# Patient Record
Sex: Male | Born: 1955
Health system: Southern US, Community
[De-identification: ages and names within clinical notes are randomized; demographics above are authoritative.]

## PROBLEM LIST (undated history)

## (undated) DIAGNOSIS — R972 Elevated prostate specific antigen [PSA]: Secondary | ICD-10-CM

## (undated) DIAGNOSIS — R399 Unspecified symptoms and signs involving the genitourinary system: Secondary | ICD-10-CM

## (undated) DIAGNOSIS — G4733 Obstructive sleep apnea (adult) (pediatric): Secondary | ICD-10-CM

## (undated) DIAGNOSIS — N400 Enlarged prostate without lower urinary tract symptoms: Secondary | ICD-10-CM

## (undated) DIAGNOSIS — M1990 Unspecified osteoarthritis, unspecified site: Secondary | ICD-10-CM

## (undated) DIAGNOSIS — K219 Gastro-esophageal reflux disease without esophagitis: Secondary | ICD-10-CM

## (undated) HISTORY — DX: Unspecified osteoarthritis, unspecified site: M19.90

## (undated) HISTORY — DX: Gastro-esophageal reflux disease without esophagitis: K21.9

---

## 2009-03-03 HISTORY — PX: COLONOSCOPY: SHX174

## 2011-08-19 ENCOUNTER — Encounter: Payer: Self-pay | Admitting: Family Medicine

## 2011-08-19 ENCOUNTER — Ambulatory Visit (INDEPENDENT_AMBULATORY_CARE_PROVIDER_SITE_OTHER): Payer: 59 | Admitting: Family Medicine

## 2011-08-19 VITALS — BP 116/62 | HR 73 | Ht 70.5 in | Wt 191.0 lb

## 2011-08-19 DIAGNOSIS — M79609 Pain in unspecified limb: Secondary | ICD-10-CM

## 2011-08-19 DIAGNOSIS — K219 Gastro-esophageal reflux disease without esophagitis: Secondary | ICD-10-CM | POA: Insufficient documentation

## 2011-08-19 DIAGNOSIS — M79642 Pain in left hand: Secondary | ICD-10-CM

## 2011-08-19 DIAGNOSIS — L989 Disorder of the skin and subcutaneous tissue, unspecified: Secondary | ICD-10-CM

## 2011-08-19 DIAGNOSIS — G8929 Other chronic pain: Secondary | ICD-10-CM

## 2011-08-19 DIAGNOSIS — M11849 Other specified crystal arthropathies, unspecified hand: Secondary | ICD-10-CM | POA: Insufficient documentation

## 2011-08-19 MED ORDER — GABAPENTIN 300 MG PO CAPS: 300.0000 mg | ORAL_CAPSULE | Freq: Three times a day (TID) | ORAL | Status: DC

## 2011-08-19 MED ORDER — PANTOPRAZOLE SODIUM 40 MG PO TBEC: 40.0000 mg | DELAYED_RELEASE_TABLET | Freq: Every day | ORAL | Status: DC

## 2011-08-19 MED ORDER — MELOXICAM 15 MG PO TABS: 15.0000 mg | ORAL_TABLET | Freq: Every day | ORAL | Status: DC
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
## 2011-08-19 NOTE — Patient Instructions (Signed)

## 2011-10-21 ENCOUNTER — Ambulatory Visit (INDEPENDENT_AMBULATORY_CARE_PROVIDER_SITE_OTHER): Payer: 59 | Admitting: Family Medicine

## 2011-10-21 ENCOUNTER — Encounter: Payer: Self-pay | Admitting: Family Medicine

## 2011-10-21 VITALS — BP 120/80 | HR 68 | Ht 70.5 in | Wt 187.0 lb

## 2011-10-21 DIAGNOSIS — M67919 Unspecified disorder of synovium and tendon, unspecified shoulder: Secondary | ICD-10-CM

## 2011-10-21 DIAGNOSIS — M79609 Pain in unspecified limb: Secondary | ICD-10-CM

## 2011-10-21 DIAGNOSIS — M79641 Pain in right hand: Secondary | ICD-10-CM

## 2011-10-21 DIAGNOSIS — M7550 Bursitis of unspecified shoulder: Secondary | ICD-10-CM

## 2011-10-21 DIAGNOSIS — L989 Disorder of the skin and subcutaneous tissue, unspecified: Secondary | ICD-10-CM

## 2011-10-21 MED ORDER — OMEPRAZOLE 40 MG PO CPDR: 40.0000 mg | DELAYED_RELEASE_CAPSULE | Freq: Every day | ORAL | Status: DC

## 2011-10-21 MED ORDER — CELECOXIB 200 MG PO CAPS: 200.0000 mg | ORAL_CAPSULE | Freq: Two times a day (BID) | ORAL | Status: DC

## 2011-10-21 NOTE — Progress Notes (Signed)
  Subjective:    Patient ID: Cody Kane, male    DOB: March 12, 1955, 56 y.o.   MRN: [id number]  HPI #1 patient reports having seen the rheumatologist. He reports Neurontin and tramadol both caused CNS side effects with sedation and lightheadedness. He states that he went back to see the rheumatologist who gave him a trial of Celebrex which worked well and causing this in a side effects. #2 reworked better for him than the Protonix. flux. The Protonix also didn't agree with his system. When he took Prilosec OTC but doubled up that  #3 skin tag. He says skin tag on the right lower eye that has continued to grow and cause irritation.  #4 right shoulder pain. The pain in the right shoulder has been occurring for the last 2 weeks despite being on Celebrex twice a day that the rheumatologist has placed him on. He was curious if he can get a cortisone injection in the right shoulder.  Review of Systems  Gastrointestinal:       Dyspepsia  Musculoskeletal: Positive for arthralgias.  Skin:       Facial lesions on the right lower eye.  All other systems reviewed and are negative.        BP 120/80  Pulse 68  Ht 5' 10.5" (1.791 m)  Wt 187 lb (84.823 kg)  BMI 26.45 kg/m2  SpO2 100% Objective:   Physical Exam  Constitutional: He is oriented to person, place, and time. He appears well-developed. No distress.  HENT:  Head: Normocephalic.  Musculoskeletal: Normal range of motion. He exhibits tenderness.       Tenderness present in the right shoulder consistent with tendinitis or bursitis he is able to raise the right arm above his head.  Neurological: He is alert and oriented to person, place, and time.  Skin: Skin is warm and dry. No rash noted. He is not diaphoretic.       Moderate size skin tag present under the right eye  Psychiatric: He has a normal mood and affect. His behavior is normal.      Assessment & Plan:  #1 Will go ahead and stop the Mobic as well. New prescription for  Celebrex one  200 mg capsule once to twice a day  #2 reflux. We'll place him on Nuexim 40 mg one capsule a day and see if that helps Nexium.  #3 skin tag, Cyro-surgery performed with good freezing of lesion. Explained to return in 6-8 weeks if it doesn't slough off completely.   #4 injection done in the right shoulder. Patient tolerated the injection well. Diagnosis of bursitis given due to the crunch heard after the third pass of medication injected.    Return in 2 months for yearly examination.Shoulder Injection Procedure Note  Pre-operative Diagnosis: right shoulder bursitis  Post-operative Diagnosis: same  Indications: Unexplained monoarthritis despite being on mobic and then celebrex  Anesthesia: not required   Procedure Details   Verbal consent was obtained for the procedure. The shoulder was prepped with iodine Using a 22 gauge needle the glenohumeral joint is injected with  A total of 9 mL 1% lidocaine w/o and 1 mL of decadron 40mg /ml . A total of 3 injections were given roughly 3 mls each. On the third injection a distinct crunch sound was heard consistent with a calcified bursa being injected. Complications:  None; patient tolerated the procedure well. And a Band-Aid was placed over the site.

## 2011-10-21 NOTE — Patient Instructions (Signed)
Bursitis  Bursitis is a swelling and soreness (inflammation) of a fluid-filled sac (bursa) that overlies and protects a joint. It can be caused by injury, overuse of the joint, arthritis or infection. The joints most likely to be affected are the elbows, shoulders, hips and knees.  HOME CARE INSTRUCTIONS    Apply ice to the affected area for 15 to 20 minutes each hour while awake for 2 days. Put the ice in a plastic bag and place a towel between the bag of ice and your skin.   Rest the injured joint as much as possible, but continue to put the joint through a full range of motion, 4 times per day. (The shoulder joint especially becomes rapidly "frozen" if not used.) When the pain lessens, begin normal slow movements and usual activities.   Only take over-the-counter or prescription medicines for pain, discomfort or fever as directed by your caregiver.   Your caregiver may recommend draining the bursa and injecting medicine into the bursa. This may help the healing process.   Follow all instructions for follow-up with your caregiver. This includes any orthopedic referrals, physical therapy and rehabilitation. Any delay in obtaining necessary care could result in a delay or failure of the bursitis to heal and chronic pain.  SEEK IMMEDIATE MEDICAL CARE IF:    Your pain increases even during treatment.   You develop an oral temperature above 102 F (38.9 C) and have heat and inflammation over the involved bursa.  MAKE SURE YOU:    Understand these instructions.   Will watch your condition.   Will get help right away if you are not doing well or get worse.  Document Released: 02/15/2000 Document Revised: 02/06/2011 Document Reviewed: 01/19/2009  [employer] Patient Information 2012 [employer].

## 2011-12-23 ENCOUNTER — Encounter: Payer: 59 | Admitting: Family Medicine

## 2011-12-23 DIAGNOSIS — Z0289 Encounter for other administrative examinations: Secondary | ICD-10-CM

## 2012-03-24 ENCOUNTER — Ambulatory Visit (INDEPENDENT_AMBULATORY_CARE_PROVIDER_SITE_OTHER): Payer: 59 | Admitting: Sports Medicine

## 2012-03-24 ENCOUNTER — Ambulatory Visit (INDEPENDENT_AMBULATORY_CARE_PROVIDER_SITE_OTHER): Payer: 59

## 2012-03-24 DIAGNOSIS — M25511 Pain in right shoulder: Secondary | ICD-10-CM

## 2012-03-24 DIAGNOSIS — M19011 Primary osteoarthritis, right shoulder: Secondary | ICD-10-CM | POA: Insufficient documentation

## 2012-03-24 DIAGNOSIS — M79609 Pain in unspecified limb: Secondary | ICD-10-CM

## 2012-03-24 DIAGNOSIS — M79641 Pain in right hand: Secondary | ICD-10-CM

## 2012-03-24 DIAGNOSIS — M79642 Pain in left hand: Secondary | ICD-10-CM

## 2012-03-24 DIAGNOSIS — M19019 Primary osteoarthritis, unspecified shoulder: Secondary | ICD-10-CM

## 2012-03-24 DIAGNOSIS — M25519 Pain in unspecified shoulder: Secondary | ICD-10-CM

## 2012-03-24 MED ORDER — DICLOFENAC SODIUM 1 % TD GEL: 2.0000 g | Freq: Four times a day (QID) | TRANSDERMAL | Status: DC

## 2012-03-24 NOTE — Assessment & Plan Note (Signed)
Symptoms are predominantly at the proximal and distal interphalangeal joints, this is highly suspicious for osteoarthritis. He will continue oral NSAIDs at home. I am also going to use Voltaren gel. I would like to see him back in 4 weeks and we can certainly consider x-ray and intra-articular injection if still painful.

## 2012-03-24 NOTE — Progress Notes (Signed)
SPORTS MEDICINE CONSULTATION REPORT  Subjective:    CC: Right shoulder pain  HPI: Cody Kane is a very pleasant 57 year old male who comes in with a long history of pain he localizes deep into the joint line of the right shoulder. He recalls approximately a decade ago had an MRI which showed some rotator cuff disease, it is equal he had a subacromial injection which was effective in controlling his pain. He also had some formal physical therapy. More recently, 3 months ago he saw Dr. Thurmond Butts who performed an injection it sounds like an attempted glenohumeral injection. Unfortunately the patient's pain did not improve not even for an hour after the procedure. Currently she localizes the pain to the joint line, it radiates down his arm but not past the elbow, it is not worse with overhead activities or reaching type activities. He does note a grind and occasional catch.  Occasionally wakes him from sleep but only when he's lying on the contralateral shoulder. He denies any trauma, or constitutional symptoms.  Bilateral hand pain: Predominantly bilaterally at the second through fourth proximal and distal interphalangeal joints. He does get significant gelling and stiffness in the morning that lasts anywhere from 1-2 hours. Currently is not having much pain. He has already been to the rheumatologist and has negative rheumatologic workup thus far. He's never had x-rays of his hands, has never had intra-articular treatment only oral.  Past medical history, Surgical history, Family history not pertinant except as noted below, Social history, Allergies, and medications have been entered into the medical record, reviewed, and no changes needed.   Review of Systems: No headache, visual changes, nausea, vomiting, diarrhea, constipation, dizziness, abdominal pain, skin rash, fevers, chills, night sweats, weight loss, swollen lymph nodes, body aches, joint swelling, muscle aches, chest pain, shortness of breath, mood  changes, visual or auditory hallucinations.   Objective:   General: Well Developed, well nourished, and in no acute distress.  Neuro/Psych: Alert and oriented x3, extra-ocular muscles intact, able to move all 4 extremities, sensation grossly intact. Skin: Warm and dry, no rashes noted.  Respiratory: Not using accessory muscles, speaking in full sentences, trachea midline.  Cardiovascular: Pulses palpable, no extremity edema. Abdomen: Does not appear distended. Right Shoulder: Inspection reveals no abnormalities, atrophy or asymmetry. Palpation is normal with no tenderness over AC joint or bicipital groove. ROM is full in all planes. Rotator cuff strength normal throughout. No signs of impingement with negative Neer and Hawkin's tests, empty can sign. Speeds and Yergason's tests normal. Negative O'Brien's, clunk, with good stability. Positive crank test, mild, suggests glenohumeral pathology. Normal scapular function observed. No painful arc and no drop arm sign. No apprehension sign Hands:  Both hands are somewhat full predominantly over the proximal interphalangeal joints. There stable with full range of motion, and no tenderness today.  X-rays were ordered and interpreted by me, they're essentially negative with only mild DJD at the a.c. joint, but overall normal-appearing glenohumeral joint. The axillary view is not an ideal angle and this is difficult to accurately determine the degree of joint osteoarthritis.  Procedure: Real-time diagnostic and therapeutic Ultrasound Guided Injection of right glenohumeral joint Device: GE Logiq E  Ultrasound guided injection is preferred based studies that show increased duration, increased effect, greater accuracy, decreased procedural pain, increased response rate, and decreased cost with ultrasound guided versus blind injection.  Verbal informed consent obtained.  Time-out conducted.  Noted no overlying erythema, induration, or other signs of  local infection.  Skin prepped in a  sterile fashion.  Local anesthesia: Topical Ethyl chloride.  With sterile technique and under real time ultrasound guidance:  Spinal needle advanced into glenohumeral joint just distal to the glenoid labrum. 1 cc Kenalog 40, 4 cc lidocaine injected easily capsule seen filling with fluid. Completed without difficulty  Pain immediately resolved suggesting accurate placement of the medication.  Advised to call if fevers/chills, erythema, induration, drainage, or persistent bleeding.  Images permanently stored and available for review in the ultrasound unit.  Impression: Technically successful ultrasound guided injection.  Impression and Recommendations:   This case required medical decision making of moderate complexity.

## 2012-03-24 NOTE — Assessment & Plan Note (Signed)
Ultrasound guided injection as above. Home rehabilitation exercises will be given. Return to clinic in 4 weeks to see things are going.

## 2012-04-21 ENCOUNTER — Ambulatory Visit (HOSPITAL_BASED_OUTPATIENT_CLINIC_OR_DEPARTMENT_OTHER)
Admission: RE | Admit: 2012-04-21 | Discharge: 2012-04-21 | Disposition: A | Discharge status: Home / Self Care | Payer: 59 | Source: Ambulatory Visit | Attending: Sports Medicine | Admitting: Sports Medicine

## 2012-04-21 ENCOUNTER — Ambulatory Visit (INDEPENDENT_AMBULATORY_CARE_PROVIDER_SITE_OTHER): Payer: 59 | Admitting: Sports Medicine

## 2012-04-21 DIAGNOSIS — M79609 Pain in unspecified limb: Secondary | ICD-10-CM

## 2012-04-21 DIAGNOSIS — M19011 Primary osteoarthritis, right shoulder: Secondary | ICD-10-CM

## 2012-04-21 DIAGNOSIS — M79641 Pain in right hand: Secondary | ICD-10-CM

## 2012-04-21 DIAGNOSIS — M19019 Primary osteoarthritis, unspecified shoulder: Secondary | ICD-10-CM

## 2012-04-21 MED ORDER — ACETAMINOPHEN ER 650 MG PO TBCR: 1300.0000 mg | EXTENDED_RELEASE_TABLET | Freq: Three times a day (TID) | ORAL | Status: DC | PRN

## 2012-04-21 NOTE — Progress Notes (Signed)
  Subjective:    CC: Followup  HPI: Glenohumeral arthritis: 100% resolved status post glenohumeral joint injection at the last visit.  Bilateral hand pain: Pain is worse at the metacarpophalangeal and proximal interphalangeal joints associated with swelling and stiffness. He's currently using Celebrex and does not want to pursue interventional treatment at this time. It is localized, moderate, doesn't radiate.  Past medical history, Surgical history, Family history not pertinant except as noted below, Social history, Allergies, and medications have been entered into the medical record, reviewed, and no changes needed.   Review of Systems: No headache, visual changes, nausea, vomiting, diarrhea, constipation, dizziness, abdominal pain, skin rash, fevers, chills, night sweats, weight loss, swollen lymph nodes, body aches, joint swelling, muscle aches, chest pain, shortness of breath, mood changes, visual or auditory hallucinations.   Objective:   General: Well Developed, well nourished, and in no acute distress.  Neuro/Psych: Alert and oriented x3, extra-ocular muscles intact, able to move all 4 extremities, sensation grossly intact. Skin: Warm and dry, no rashes noted.  Respiratory: Not using accessory muscles, speaking in full sentences, trachea midline.  Cardiovascular: Pulses palpable, no extremity edema. Abdomen: Does not appear distended.  Impression and Recommendations:   This case required medical decision making of moderate complexity.

## 2012-04-21 NOTE — Assessment & Plan Note (Signed)
Shoulder pain has improved significantly status post ultrasound-guided glenohumeral joint injection at the last visit.

## 2012-04-21 NOTE — Assessment & Plan Note (Signed)
Symptoms are highly classic for osteoarthritis particularly of the metacarpophalangeal and proximal interphalangeal joints. I would like x-rays or confirmation. He will continue Celebrex I would like to add Tylenol 650 mg 2 tabs 3, times a day. Return at the next flare we can consider metacarpophalangeal and interphalangeal joint injections.

## 2012-07-20 ENCOUNTER — Ambulatory Visit (INDEPENDENT_AMBULATORY_CARE_PROVIDER_SITE_OTHER): Payer: 59 | Admitting: Sports Medicine

## 2012-07-20 ENCOUNTER — Encounter: Payer: Self-pay | Admitting: Sports Medicine

## 2012-07-20 VITALS — BP 125/73 | HR 74 | Wt 188.0 lb

## 2012-07-20 DIAGNOSIS — M79641 Pain in right hand: Secondary | ICD-10-CM

## 2012-07-20 DIAGNOSIS — M25549 Pain in joints of unspecified hand: Secondary | ICD-10-CM

## 2012-07-20 DIAGNOSIS — M79609 Pain in unspecified limb: Secondary | ICD-10-CM

## 2012-07-20 MED ORDER — OMEPRAZOLE 40 MG PO CPDR: 40.0000 mg | DELAYED_RELEASE_CAPSULE | Freq: Every day | ORAL | Status: DC

## 2012-07-20 MED ORDER — CELECOXIB 200 MG PO CAPS: 200.0000 mg | ORAL_CAPSULE | Freq: Two times a day (BID) | ORAL | Status: DC

## 2012-07-20 NOTE — Progress Notes (Signed)
  Subjective:    CC: Followup  HPI:  Hand osteoarthritis:  Has been moderately well controlled with Celebrex and Tylenol 650, until recently. Now he has pain that he localizes predominantly in the proximal interphalangeal joints, without radiation, moderate to severe. Worsening. Wonders if we can do injections.  Past medical history, Surgical history, Family history not pertinant except as noted below, Social history, Allergies, and medications have been entered into the medical record, reviewed, and no changes needed.   Review of Systems: No fevers, chills, night sweats, weight loss, chest pain, or shortness of breath.   Objective:    General: Well Developed, well nourished, and in no acute distress.  Neuro: Alert and oriented x3, extra-ocular muscles intact, sensation grossly intact.  HEENT: Normocephalic, atraumatic, pupils equal round reactive to light, neck supple, no masses, no lymphadenopathy, thyroid nonpalpable.  Skin: Warm and dry, no rashes. Cardiac: Regular rate and rhythm, no murmurs rubs or gallops, no lower extremity edema.  Respiratory: Clear to auscultation bilaterally. Not using accessory muscles, speaking in full sentences. Hands: There is fullness over the proximal interphalangeal joints bilaterally. There is minimal tenderness to palpation over the joint lines.  Procedure:  Injection of right second proximal interphalangeal joint. Consent obtained and verified. Time-out conducted. Noted no overlying erythema, induration, or other signs of local infection. Skin prepped in a sterile fashion. Topical analgesic spray: Ethyl chloride. Completed without difficulty. Meds: 0.5 cc Kenalog 40, 0.5 cc lidocaine injected easily into the joint. Pain immediately improved suggesting accurate placement of the medication. Advised to call if fevers/chills, erythema, induration, drainage, or persistent bleeding.  Procedure:  Injection of right third proximal interphalangeal  joint. Consent obtained and verified. Time-out conducted. Noted no overlying erythema, induration, or other signs of local infection. Skin prepped in a sterile fashion. Topical analgesic spray: Ethyl chloride. Completed without difficulty. Meds: 0.5 cc Kenalog 40, 0.5 cc lidocaine injected easily into the joint. Pain immediately improved suggesting accurate placement of the medication. Advised to call if fevers/chills, erythema, induration, drainage, or persistent bleeding.  Procedure:  Injection of right fourth proximal interphalangeal joint. Consent obtained and verified. Time-out conducted. Noted no overlying erythema, induration, or other signs of local infection. Skin prepped in a sterile fashion. Topical analgesic spray: Ethyl chloride. Completed without difficulty. Meds: 0.5 cc Kenalog 40, 0.5 cc lidocaine injected easily into the joint. Pain immediately improved suggesting accurate placement of the medication. Advised to call if fevers/chills, erythema, induration, drainage, or persistent bleeding.  Procedure:  Injection of right fifth proximal interphalangeal joint. Consent obtained and verified. Time-out conducted. Noted no overlying erythema, induration, or other signs of local infection. Skin prepped in a sterile fashion. Topical analgesic spray: Ethyl chloride. Completed without difficulty. Meds: 0.5 cc Kenalog 40, 0.5 cc lidocaine injected easily into the joint. Pain immediately improved suggesting accurate placement of the medication. Advised to call if fevers/chills, erythema, induration, drainage, or persistent bleeding. Impression and Recommendations:

## 2012-07-20 NOTE — Assessment & Plan Note (Signed)
Injected right second through fifth proximal phalangeal joint. Return to do the other side.

## 2012-07-21 ENCOUNTER — Encounter: Payer: Self-pay | Admitting: Sports Medicine

## 2012-07-21 ENCOUNTER — Ambulatory Visit (HOSPITAL_BASED_OUTPATIENT_CLINIC_OR_DEPARTMENT_OTHER)
Admission: RE | Admit: 2012-07-21 | Discharge: 2012-07-21 | Disposition: A | Discharge status: Home / Self Care | Payer: 59 | Source: Ambulatory Visit | Attending: Sports Medicine | Admitting: Sports Medicine

## 2012-07-21 ENCOUNTER — Other Ambulatory Visit: Payer: Self-pay | Admitting: Sports Medicine

## 2012-07-21 ENCOUNTER — Telehealth: Payer: Self-pay | Admitting: *Deleted

## 2012-07-21 ENCOUNTER — Ambulatory Visit (INDEPENDENT_AMBULATORY_CARE_PROVIDER_SITE_OTHER): Payer: 59 | Admitting: Sports Medicine

## 2012-07-21 VITALS — BP 128/76 | HR 67 | Wt 189.0 lb

## 2012-07-21 DIAGNOSIS — M79641 Pain in right hand: Secondary | ICD-10-CM

## 2012-07-21 DIAGNOSIS — Z0000 Encounter for general adult medical examination without abnormal findings: Secondary | ICD-10-CM | POA: Insufficient documentation

## 2012-07-21 DIAGNOSIS — N139 Obstructive and reflux uropathy, unspecified: Secondary | ICD-10-CM

## 2012-07-21 DIAGNOSIS — A6000 Herpesviral infection of urogenital system, unspecified: Secondary | ICD-10-CM

## 2012-07-21 DIAGNOSIS — Z87891 Personal history of nicotine dependence: Secondary | ICD-10-CM

## 2012-07-21 DIAGNOSIS — Z299 Encounter for prophylactic measures, unspecified: Secondary | ICD-10-CM

## 2012-07-21 DIAGNOSIS — M79642 Pain in left hand: Secondary | ICD-10-CM

## 2012-07-21 DIAGNOSIS — Z1211 Encounter for screening for malignant neoplasm of colon: Secondary | ICD-10-CM

## 2012-07-21 LAB — CBC
HCT: 40.6 % (ref 39.0–52.0)
Hemoglobin: 13.5 g/dL (ref 13.0–17.0)
MCH: 28.8 pg (ref 26.0–34.0)
MCHC: 33.3 g/dL (ref 30.0–36.0)
MCV: 86.6 fL (ref 78.0–100.0)
Platelets: 227 K/uL (ref 150–400)
RBC: 4.69 MIL/uL (ref 4.22–5.81)
RDW: 13.5 % (ref 11.5–15.5)
WBC: 6.7 K/uL (ref 4.0–10.5)

## 2012-07-21 LAB — COMPREHENSIVE METABOLIC PANEL WITH GFR
AST: 13 U/L (ref 0–37)
Albumin: 4.4 g/dL (ref 3.5–5.2)
Alkaline Phosphatase: 74 U/L (ref 39–117)
BUN: 23 mg/dL (ref 6–23)
CO2: 24 meq/L (ref 19–32)
Chloride: 104 meq/L (ref 96–112)
Glucose, Bld: 91 mg/dL (ref 70–99)
Sodium: 135 meq/L (ref 135–145)

## 2012-07-21 LAB — LIPID PANEL
Cholesterol: 206 mg/dL — ABNORMAL HIGH (ref 0–200)
HDL: 40 mg/dL (ref >39)
LDL Cholesterol: 142 mg/dL — ABNORMAL HIGH (ref 0–99)
Total CHOL/HDL Ratio: 5.2 Ratio
Triglycerides: 121 mg/dL (ref <150)
VLDL: 24 mg/dL (ref 0–40)

## 2012-07-21 LAB — PSA, TOTAL AND FREE
PSA, Free Pct: 22 % — ABNORMAL LOW (ref >25)
PSA, Free: 1.04 ng/mL
PSA: 4.71 ng/mL — ABNORMAL HIGH (ref <=4.00)

## 2012-07-21 LAB — COMPREHENSIVE METABOLIC PANEL
ALT: 17 U/L (ref 0–53)
Calcium: 9.4 mg/dL (ref 8.4–10.5)
Creat: 0.96 mg/dL (ref 0.50–1.35)
Potassium: 4 mEq/L (ref 3.5–5.3)
Total Bilirubin: 0.6 mg/dL (ref 0.3–1.2)
Total Protein: 7 g/dL (ref 6.0–8.3)

## 2012-07-21 LAB — GASTRIC OCCULT BLOOD (1-CARD TO LAB): Occult Blood, Gastric POC: NEGATIVE

## 2012-07-21 LAB — TSH: TSH: 0.294 u[IU]/mL — ABNORMAL LOW (ref 0.350–4.500)

## 2012-07-21 MED ORDER — TAMSULOSIN HCL 0.4 MG PO CAPS: 0.4000 mg | ORAL_CAPSULE | Freq: Every day | ORAL | Status: DC

## 2012-07-21 NOTE — Assessment & Plan Note (Signed)
Flomax, return in one month for this.

## 2012-07-21 NOTE — Assessment & Plan Note (Signed)
Complete physical performed today. 

## 2012-07-21 NOTE — Telephone Encounter (Signed)
Called to obtain PA for CT chest w/o contrast. Insurance states online no PA needed.

## 2012-07-21 NOTE — Assessment & Plan Note (Signed)
Screening CT scan.

## 2012-07-21 NOTE — Progress Notes (Signed)
  Subjective:    CC: Complete physical   HPI:  Preventive measure:  Complete physical today, needs blood work, does desire PSA screening, has a greater than 30-pack-year smoking history, he quit, but only approximately 5 years ago.  Hand osteoarthritis: Failed oral medications, I injected his second through fifth proximal interphalangeal joint of his right hand yesterday, he is a little bit sore today.   Obstructive uropathy: Nocturia, weak stream, dribbling, incomplete emptying.  Past medical history, Surgical history, Family history not pertinant except as noted below, Social history, Allergies, and medications have been entered into the medical record, reviewed, and no changes needed.   Review of Systems: No headache, visual changes, nausea, vomiting, diarrhea, constipation, dizziness, abdominal pain, skin rash, fevers, chills, night sweats, swollen lymph nodes, weight loss, chest pain, body aches, joint swelling, muscle aches, shortness of breath, mood changes, visual or auditory hallucinations.  Objective:    General: Well Developed, well nourished, and in no acute distress.  Neuro: Alert and oriented x3, extra-ocular muscles intact, sensation grossly intact.  HEENT: Normocephalic, atraumatic, pupils equal round reactive to light, neck supple, no masses, no lymphadenopathy, thyroid nonpalpable.  Skin: Warm and dry, no rashes noted.  Cardiac: Regular rate and rhythm, no murmurs rubs or gallops.  Respiratory: Clear to auscultation bilaterally. Not using accessory muscles, speaking in full sentences.  Abdominal: Soft, nontender, nondistended, positive bowel sounds, no masses, no organomegaly.  Rectal: Good tone, Hemoccult negative, prostate is smoothly enlarged. Musculoskeletal: Shoulder, elbow, wrist, hip, knee, ankle stable, and with full range of motion. Impression and Recommendations:    The patient was counselled, risk factors were discussed, anticipatory guidance given.

## 2012-07-21 NOTE — Assessment & Plan Note (Signed)
Right-side improved status post injections. He will come back sometime in the next several weeks for consideration of injection of the left proximal interphalangeal joint.

## 2012-07-22 ENCOUNTER — Ambulatory Visit (INDEPENDENT_AMBULATORY_CARE_PROVIDER_SITE_OTHER): Payer: 59 | Admitting: Sports Medicine

## 2012-07-22 ENCOUNTER — Encounter: Payer: Self-pay | Admitting: Sports Medicine

## 2012-07-22 VITALS — BP 126/74 | HR 67

## 2012-07-22 DIAGNOSIS — N139 Obstructive and reflux uropathy, unspecified: Secondary | ICD-10-CM

## 2012-07-22 DIAGNOSIS — A6000 Herpesviral infection of urogenital system, unspecified: Secondary | ICD-10-CM | POA: Insufficient documentation

## 2012-07-22 DIAGNOSIS — Z87891 Personal history of nicotine dependence: Secondary | ICD-10-CM

## 2012-07-22 DIAGNOSIS — M19049 Primary osteoarthritis, unspecified hand: Secondary | ICD-10-CM

## 2012-07-22 DIAGNOSIS — M79641 Pain in right hand: Secondary | ICD-10-CM

## 2012-07-22 DIAGNOSIS — E785 Hyperlipidemia, unspecified: Secondary | ICD-10-CM

## 2012-07-22 LAB — TESTOSTERONE, FREE, TOTAL, SHBG
Sex Hormone Binding: 50 nmol/L (ref 13–71)
Testosterone, Free: 41.2 pg/mL — ABNORMAL LOW (ref 47.0–244.0)
Testosterone-% Free: 1.5 % — ABNORMAL LOW (ref 1.6–2.9)
Testosterone: 277 ng/dL — ABNORMAL LOW (ref 300–890)

## 2012-07-22 LAB — VITAMIN D 25 HYDROXY (VIT D DEFICIENCY, FRACTURES): Vit D, 25-Hydroxy: 29 ng/mL — ABNORMAL LOW (ref 30–89)

## 2012-07-22 MED ORDER — VALACYCLOVIR HCL 1 G PO TABS: 1000.0000 mg | ORAL_TABLET | Freq: Two times a day (BID) | ORAL | Status: DC

## 2012-07-22 MED ORDER — ATORVASTATIN CALCIUM 40 MG PO TABS: 40.0000 mg | ORAL_TABLET | Freq: Every day | ORAL | Status: DC

## 2012-07-22 NOTE — Assessment & Plan Note (Signed)
Improved with Flomax 

## 2012-07-22 NOTE — Assessment & Plan Note (Addendum)
Screening chest CT is negative.

## 2012-07-22 NOTE — Assessment & Plan Note (Signed)
Valtrex

## 2012-07-22 NOTE — Progress Notes (Signed)
  Subjective:    CC: Followup  HPI: Hand osteoarthritis: Cody Kane comes back, we injected the second through fourth proximal interphalangeal joint of the right hand at the last visit. He is essentially pain free now. He returns today for consideration of left second through fourth proximal interphalangeal joint injections.  Hyperlipidemia: Desires to start Lipitor.  Obstructive uropathy:  Symptoms are much better with Flomax 0.4.  Past medical history, Surgical history, Family history not pertinant except as noted below, Social history, Allergies, and medications have been entered into the medical record, reviewed, and no changes needed.   Review of Systems: No fevers, chills, night sweats, weight loss, chest pain, or shortness of breath.   Objective:    General: Well Developed, well nourished, and in no acute distress.  Neuro: Alert and oriented x3, extra-ocular muscles intact, sensation grossly intact.  HEENT: Normocephalic, atraumatic, pupils equal round reactive to light, neck supple, no masses, no lymphadenopathy, thyroid nonpalpable.  Skin: Warm and dry, no rashes. Cardiac: Regular rate and rhythm, no murmurs rubs or gallops, no lower extremity edema.  Respiratory: Clear to auscultation bilaterally. Not using accessory muscles, speaking in full sentences.  Procedure: Real-time Ultrasound Guided Injection of left second, third, fourth, fifth proximal interphalangeal joints. Device: GE Logiq E  Ultrasound guided injection is preferred based studies that show increased duration, increased effect, greater accuracy, decreased procedural pain, increased response rate, and decreased cost with ultrasound guided versus blind injection.  Verbal informed consent obtained.  Time-out conducted.  Noted no overlying erythema, induration, or other signs of local infection.  Skin prepped in a sterile fashion.  Local anesthesia: Topical Ethyl chloride.  With sterile technique and under real time  ultrasound guidance:  0.5 cc Kenalog 40, 0.5 cc lidocaine injected into the left second, third, fourth, and fifth proximal interphalangeal joints respectively. Completed without difficulty  Pain immediately resolved suggesting accurate placement of the medication.  Advised to call if fevers/chills, erythema, induration, drainage, or persistent bleeding.  Images permanently stored and available for review in the ultrasound unit.  Impression: Technically successful ultrasound guided injection. Impression and Recommendations:

## 2012-07-22 NOTE — Addendum Note (Signed)
Addended by: Monica Becton on: 07/22/2012 09:28 AM   Modules accepted: Orders

## 2012-07-22 NOTE — Assessment & Plan Note (Signed)
Injected the left second through fourth proximal interphalangeal joints under guidance.

## 2012-07-22 NOTE — Assessment & Plan Note (Signed)
Lipitor 

## 2012-10-28 ENCOUNTER — Other Ambulatory Visit: Payer: Self-pay | Admitting: *Deleted

## 2012-10-28 DIAGNOSIS — E785 Hyperlipidemia, unspecified: Secondary | ICD-10-CM

## 2012-10-28 MED ORDER — ATORVASTATIN CALCIUM 40 MG PO TABS: 40.0000 mg | ORAL_TABLET | Freq: Every day | ORAL | Status: DC

## 2012-10-29 ENCOUNTER — Other Ambulatory Visit: Payer: Self-pay | Admitting: Sports Medicine

## 2012-10-29 ENCOUNTER — Other Ambulatory Visit: Payer: Self-pay

## 2012-10-29 DIAGNOSIS — E785 Hyperlipidemia, unspecified: Secondary | ICD-10-CM

## 2012-10-29 MED ORDER — ATORVASTATIN CALCIUM 40 MG PO TABS: 40.0000 mg | ORAL_TABLET | Freq: Every day | ORAL | Status: DC

## 2012-11-02 ENCOUNTER — Other Ambulatory Visit: Payer: Self-pay | Admitting: *Deleted

## 2012-11-02 DIAGNOSIS — E785 Hyperlipidemia, unspecified: Secondary | ICD-10-CM

## 2012-11-15 ENCOUNTER — Telehealth: Payer: Self-pay

## 2012-11-15 ENCOUNTER — Telehealth: Payer: Self-pay | Admitting: *Deleted

## 2012-11-15 DIAGNOSIS — E785 Hyperlipidemia, unspecified: Secondary | ICD-10-CM

## 2012-11-15 MED ORDER — ATORVASTATIN CALCIUM 40 MG PO TABS: 40.0000 mg | ORAL_TABLET | Freq: Every day | ORAL | Status: DC

## 2012-11-15 NOTE — Telephone Encounter (Signed)
Hi Dr. Karie Schwalbe, [facility] called our office for verbal auth for Atorvastatin for this patient. Could you please forward this to your staff.  [facility]: [id number] Ref# [id number]

## 2012-11-15 NOTE — Telephone Encounter (Signed)
Refilled

## 2013-01-06 ENCOUNTER — Other Ambulatory Visit: Payer: Self-pay

## 2013-08-03 ENCOUNTER — Ambulatory Visit: Payer: 59 | Admitting: Sports Medicine

## 2013-08-04 ENCOUNTER — Encounter: Payer: Self-pay | Admitting: Sports Medicine

## 2013-08-04 ENCOUNTER — Ambulatory Visit (INDEPENDENT_AMBULATORY_CARE_PROVIDER_SITE_OTHER): Payer: 59 | Admitting: Sports Medicine

## 2013-08-04 VITALS — BP 128/78 | HR 73 | Ht 70.0 in | Wt 195.0 lb

## 2013-08-04 DIAGNOSIS — M159 Polyosteoarthritis, unspecified: Secondary | ICD-10-CM

## 2013-08-04 NOTE — Assessment & Plan Note (Signed)
Injections placed into the left second, third, fourth, and fifth proximal interphalangeal joints as well as the right second, third, fourth, and fifth proximal interphalangeal joints. Return as needed.

## 2013-08-04 NOTE — Progress Notes (Signed)
Subjective:    CC: Hand pain  HPI: This is an extremely pleasant 58 year old male, he has bilateral hand osteoarthritis with pain generators predominately at the proximal interphalangeal joints. Over a year ago we injected his PIP joints, and he had a fantastic response. He is now having a recurrence of pain, moderate, persistent, in his second through fifth proximal interphalangeal joints bilaterally.  Past medical history, Surgical history, Family history not pertinant except as noted below, Social history, Allergies, and medications have been entered into the medical record, reviewed, and no changes needed.   Review of Systems: No fevers, chills, night sweats, weight loss, chest pain, or shortness of breath.   Objective:    General: Well Developed, well nourished, and in no acute distress.  Neuro: Alert and oriented x3, extra-ocular muscles intact, sensation grossly intact.  HEENT: Normocephalic, atraumatic, pupils equal round reactive to light, neck supple, no masses, no lymphadenopathy, thyroid nonpalpable.  Skin: Warm and dry, no rashes. Cardiac: Regular rate and rhythm, no murmurs rubs or gallops, no lower extremity edema.  Respiratory: Clear to auscultation bilaterally. Not using accessory muscles, speaking in full sentences.  Procedure: Real-time Ultrasound Guided Injection of left second proximal interphalangeal joint Device: GE Logiq E  Verbal informed consent obtained.  Time-out conducted.  Noted no overlying erythema, induration, or other signs of local infection.  Skin prepped in a sterile fashion.  Local anesthesia: Topical Ethyl chloride.  With sterile technique and under real time ultrasound guidance:  0.5 cc Kenalog 40, 0.5 cc lidocaine injected easily. Completed without difficulty  Pain immediately resolved suggesting accurate placement of the medication.  Advised to call if fevers/chills, erythema, induration, drainage, or persistent bleeding.  Images permanently  stored and available for review in the ultrasound unit.  Impression: Technically successful ultrasound guided injection.  Procedure: Real-time Ultrasound Guided Injection of left third proximal interphalangeal joint Device: GE Logiq E  Verbal informed consent obtained.  Time-out conducted.  Noted no overlying erythema, induration, or other signs of local infection.  Skin prepped in a sterile fashion.  Local anesthesia: Topical Ethyl chloride.  With sterile technique and under real time ultrasound guidance:  0.5 cc Kenalog 40, 0.5 cc lidocaine injected easily. Completed without difficulty  Pain immediately resolved suggesting accurate placement of the medication.  Advised to call if fevers/chills, erythema, induration, drainage, or persistent bleeding.  Images permanently stored and available for review in the ultrasound unit.  Impression: Technically successful ultrasound guided injection.  Procedure: Real-time Ultrasound Guided Injection of left fourth proximal interphalangeal joint Device: GE Logiq E  Verbal informed consent obtained.  Time-out conducted.  Noted no overlying erythema, induration, or other signs of local infection.  Skin prepped in a sterile fashion.  Local anesthesia: Topical Ethyl chloride.  With sterile technique and under real time ultrasound guidance:  0.5 cc Kenalog 40, 0.5 cc lidocaine injected easily. Completed without difficulty  Pain immediately resolved suggesting accurate placement of the medication.  Advised to call if fevers/chills, erythema, induration, drainage, or persistent bleeding.  Images permanently stored and available for review in the ultrasound unit.  Impression: Technically successful ultrasound guided injection.  Procedure: Real-time Ultrasound Guided Injection of left fifth proximal interphalangeal joint Device: GE Logiq E  Verbal informed consent obtained.  Time-out conducted.  Noted no overlying erythema, induration, or other signs  of local infection.  Skin prepped in a sterile fashion.  Local anesthesia: Topical Ethyl chloride.  With sterile technique and under real time ultrasound guidance:  0.5 cc Kenalog 40, 0.5  cc lidocaine injected easily. Completed without difficulty  Pain immediately resolved suggesting accurate placement of the medication.  Advised to call if fevers/chills, erythema, induration, drainage, or persistent bleeding.  Images permanently stored and available for review in the ultrasound unit.  Impression: Technically successful ultrasound guided injection.  Procedure: Real-time Ultrasound Guided Injection of right second proximal interphalangeal joint Device: GE Logiq E  Verbal informed consent obtained.  Time-out conducted.  Noted no overlying erythema, induration, or other signs of local infection.  Skin prepped in a sterile fashion.  Local anesthesia: Topical Ethyl chloride.  With sterile technique and under real time ultrasound guidance:  0.5 cc Kenalog 40, 0.5 cc lidocaine injected easily. Completed without difficulty  Pain immediately resolved suggesting accurate placement of the medication.  Advised to call if fevers/chills, erythema, induration, drainage, or persistent bleeding.  Images permanently stored and available for review in the ultrasound unit.  Impression: Technically successful ultrasound guided injection.  Procedure: Real-time Ultrasound Guided Injection of right third proximal interphalangeal joint Device: GE Logiq E  Verbal informed consent obtained.  Time-out conducted.  Noted no overlying erythema, induration, or other signs of local infection.  Skin prepped in a sterile fashion.  Local anesthesia: Topical Ethyl chloride.  With sterile technique and under real time ultrasound guidance:  0.5 cc Kenalog 40, 0.5 cc lidocaine injected easily. Completed without difficulty  Pain immediately resolved suggesting accurate placement of the medication.  Advised to call if  fevers/chills, erythema, induration, drainage, or persistent bleeding.  Images permanently stored and available for review in the ultrasound unit.  Impression: Technically successful ultrasound guided injection.  Procedure: Real-time Ultrasound Guided Injection of right fourth proximal interphalangeal joint Device: GE Logiq E  Verbal informed consent obtained.  Time-out conducted.  Noted no overlying erythema, induration, or other signs of local infection.  Skin prepped in a sterile fashion.  Local anesthesia: Topical Ethyl chloride.  With sterile technique and under real time ultrasound guidance:  0.5 cc Kenalog 40, 0.5 cc lidocaine injected easily. Completed without difficulty  Pain immediately resolved suggesting accurate placement of the medication.  Advised to call if fevers/chills, erythema, induration, drainage, or persistent bleeding.  Images permanently stored and available for review in the ultrasound unit.  Impression: Technically successful ultrasound guided injection.  Procedure: Real-time Ultrasound Guided Injection of right fifth proximal interphalangeal joint Device: GE Logiq E  Verbal informed consent obtained.  Time-out conducted.  Noted no overlying erythema, induration, or other signs of local infection.  Skin prepped in a sterile fashion.  Local anesthesia: Topical Ethyl chloride.  With sterile technique and under real time ultrasound guidance:  0.5 cc Kenalog 40, 0.5 cc lidocaine injected easily. Completed without difficulty  Pain immediately resolved suggesting accurate placement of the medication.  Advised to call if fevers/chills, erythema, induration, drainage, or persistent bleeding.  Images permanently stored and available for review in the ultrasound unit.  Impression: Technically successful ultrasound guided injection.  Impression and Recommendations:

## 2013-08-10 ENCOUNTER — Other Ambulatory Visit: Payer: Self-pay | Admitting: Sports Medicine

## 2013-08-10 DIAGNOSIS — M79642 Pain in left hand: Principal | ICD-10-CM

## 2013-08-10 DIAGNOSIS — M79641 Pain in right hand: Secondary | ICD-10-CM

## 2013-08-10 MED ORDER — CELECOXIB 200 MG PO CAPS: 200.0000 mg | ORAL_CAPSULE | Freq: Two times a day (BID) | ORAL | Status: DC

## 2013-08-25 ENCOUNTER — Encounter: Payer: Self-pay | Admitting: Sports Medicine

## 2013-08-25 ENCOUNTER — Ambulatory Visit (INDEPENDENT_AMBULATORY_CARE_PROVIDER_SITE_OTHER): Payer: 59 | Admitting: Sports Medicine

## 2013-08-25 VITALS — BP 119/73 | HR 68 | Ht 70.0 in | Wt 188.0 lb

## 2013-08-25 DIAGNOSIS — M159 Polyosteoarthritis, unspecified: Secondary | ICD-10-CM

## 2013-08-25 DIAGNOSIS — Z299 Encounter for prophylactic measures, unspecified: Secondary | ICD-10-CM

## 2013-08-25 DIAGNOSIS — Z0000 Encounter for general adult medical examination without abnormal findings: Secondary | ICD-10-CM

## 2013-08-25 DIAGNOSIS — N139 Obstructive and reflux uropathy, unspecified: Secondary | ICD-10-CM

## 2013-08-25 LAB — COMPREHENSIVE METABOLIC PANEL WITH GFR
ALT: 18 U/L (ref 0–53)
AST: 12 U/L (ref 0–37)
Alkaline Phosphatase: 66 U/L (ref 39–117)
BUN: 28 mg/dL — ABNORMAL HIGH (ref 6–23)
Calcium: 9.4 mg/dL (ref 8.4–10.5)
Creat: 0.98 mg/dL (ref 0.50–1.35)
Total Bilirubin: 0.7 mg/dL (ref 0.2–1.2)

## 2013-08-25 LAB — LIPID PANEL
Cholesterol: 128 mg/dL (ref 0–200)
HDL: 49 mg/dL (ref >39)
LDL Cholesterol: 66 mg/dL (ref 0–99)
Total CHOL/HDL Ratio: 2.6 ratio
Triglycerides: 64 mg/dL (ref <150)
VLDL: 13 mg/dL (ref 0–40)

## 2013-08-25 LAB — COMPREHENSIVE METABOLIC PANEL
Albumin: 4.3 g/dL (ref 3.5–5.2)
CO2: 28 mEq/L (ref 19–32)
Chloride: 106 mEq/L (ref 96–112)
Glucose, Bld: 91 mg/dL (ref 70–99)
Potassium: 4.2 mEq/L (ref 3.5–5.3)
Sodium: 138 mEq/L (ref 135–145)
Total Protein: 7 g/dL (ref 6.0–8.3)

## 2013-08-25 LAB — CBC
HCT: 42.1 % (ref 39.0–52.0)
Hemoglobin: 14.3 g/dL (ref 13.0–17.0)
MCH: 29.7 pg (ref 26.0–34.0)
MCHC: 34 g/dL (ref 30.0–36.0)
MCV: 87.5 fL (ref 78.0–100.0)
Platelets: 191 K/uL (ref 150–400)
RBC: 4.81 MIL/uL (ref 4.22–5.81)
RDW: 13.9 % (ref 11.5–15.5)
WBC: 6.3 10*3/uL (ref 4.0–10.5)

## 2013-08-25 LAB — HEMOGLOBIN A1C
Hgb A1c MFr Bld: 5.6 % (ref <5.7)
Mean Plasma Glucose: 114 mg/dL (ref <117)

## 2013-08-25 NOTE — Assessment & Plan Note (Signed)
Pain-free after multiple interphalangeal joint injections at the last visit.

## 2013-08-25 NOTE — Assessment & Plan Note (Signed)
Complete physical as above. Checking routine blood work, PSA was slightly elevated last year, we will recheck this to assess velocity.

## 2013-08-25 NOTE — Progress Notes (Signed)
  Subjective:    CC: Complete physical   HPI:  Preventive measure: Up-to-date on all bloodwork, here for complete physical.  Hand osteoarthritis: Now pain-free after several interphalangeal joint injections at the last visit.  Past medical history, Surgical history, Family history not pertinant except as noted below, Social history, Allergies, and medications have been entered into the medical record, reviewed, and no changes needed.   Review of Systems: No headache, visual changes, nausea, vomiting, diarrhea, constipation, dizziness, abdominal pain, skin rash, fevers, chills, night sweats, swollen lymph nodes, weight loss, chest pain, body aches, joint swelling, muscle aches, shortness of breath, mood changes, visual or auditory hallucinations.  Objective:    General: Well Developed, well nourished, and in no acute distress.  Neuro: Alert and oriented x3, extra-ocular muscles intact, sensation grossly intact.  HEENT: Normocephalic, atraumatic, pupils equal round reactive to light, neck supple, no masses, no lymphadenopathy, thyroid nonpalpable. Oropharynx, nasopharynx, external ear canals are unremarkable. Skin: Warm and dry, no rashes noted.  Cardiac: Regular rate and rhythm, no murmurs rubs or gallops.  Respiratory: Clear to auscultation bilaterally. Not using accessory muscles, speaking in full sentences.  Abdominal: Soft, nontender, nondistended, positive bowel sounds, no masses, no organomegaly.  Musculoskeletal: Shoulder, elbow, wrist, hip, knee, ankle stable, and with full range of motion.  Impression and Recommendations:    The patient was counselled, risk factors were discussed, anticipatory guidance given.

## 2013-08-26 LAB — PSA, TOTAL AND FREE
PSA, Free Pct: 23 % — ABNORMAL LOW (ref >25)
PSA, Free: 1.17 ng/mL
PSA: 5.03 ng/mL — ABNORMAL HIGH (ref <=4.00)

## 2013-08-26 LAB — TSH: TSH: 0.63 u[IU]/mL (ref 0.350–4.500)

## 2013-08-26 NOTE — Assessment & Plan Note (Signed)
Continued elevation of the PSA, referral to urology.

## 2013-08-26 NOTE — Addendum Note (Signed)
Addended by: Silverio Decamp on: 08/26/2013 03:31 PM   Modules accepted: Orders

## 2013-09-13 ENCOUNTER — Encounter: Payer: Self-pay | Admitting: Sports Medicine

## 2013-09-14 ENCOUNTER — Other Ambulatory Visit: Payer: Self-pay

## 2013-09-14 DIAGNOSIS — K2100 Gastro-esophageal reflux disease with esophagitis, without bleeding: Secondary | ICD-10-CM

## 2013-09-14 MED ORDER — OMEPRAZOLE 40 MG PO CPDR: 40.0000 mg | DELAYED_RELEASE_CAPSULE | Freq: Every day | ORAL | Status: DC

## 2013-10-28 ENCOUNTER — Encounter: Payer: Self-pay | Admitting: Sports Medicine

## 2013-10-28 ENCOUNTER — Encounter: Payer: Self-pay | Admitting: Family Medicine

## 2013-10-28 ENCOUNTER — Ambulatory Visit (INDEPENDENT_AMBULATORY_CARE_PROVIDER_SITE_OTHER): Payer: 59 | Admitting: Family Medicine

## 2013-10-28 VITALS — BP 139/82 | HR 73 | Temp 97.9°F | Wt 188.0 lb

## 2013-10-28 DIAGNOSIS — R5381 Other malaise: Secondary | ICD-10-CM

## 2013-10-28 DIAGNOSIS — N139 Obstructive and reflux uropathy, unspecified: Secondary | ICD-10-CM

## 2013-10-28 DIAGNOSIS — F411 Generalized anxiety disorder: Secondary | ICD-10-CM

## 2013-10-28 DIAGNOSIS — R5383 Other fatigue: Secondary | ICD-10-CM

## 2013-10-28 MED ORDER — TAMSULOSIN HCL 0.4 MG PO CAPS: 0.4000 mg | ORAL_CAPSULE | Freq: Every day | ORAL | Status: DC

## 2013-10-28 MED ORDER — DIAZEPAM 10 MG PO TABS: ORAL_TABLET | ORAL | Status: DC

## 2013-10-28 NOTE — Progress Notes (Signed)
CC: Cody Kane is a 58 y.o. male is here for Fatigue   Subjective: HPI:  Complaints of fatigue, decreased reaction time, difficulty concentrating, decreased cognition, nonrestorative sleep, and a sensation that he did fall asleep anytime during the day old which has been present for about a month now worsening on a weekly basis. Now moderate to severe in severity. Nothing particularly makes symptoms better or worse. Denies any new change to his medication regimen or new supplements. States he was in his regular state of health prior to this starting. Denies any fevers, chills, cough, shortness of breath, motor or sensory disturbances other than that described above. Denies abdominal pain, unintentional weight loss, rash, nor any respiratory complaints. He is a snorer, he feels like he is close to falling asleep behind the wheel.   Review Of Systems Outlined In HPI  Past Medical History  Diagnosis Date  . GERD (gastroesophageal reflux disease)   . Arthritis     No past surgical history on file. Family History  Problem Relation Age of Onset  . Cancer Mother     colon  . Cancer Father     lung    History   Social History  . Marital Status: Married    Spouse Name: N/A    Number of Children: N/A  . Years of Education: N/A   Occupational History  . Not on file.   Social History Main Topics  . Smoking status: Former Smoker    Types: Cigarettes    Quit date: 05/02/2010  . Smokeless tobacco: Not on file  . Alcohol Use: 1.0 oz/week    2 drink(s) per week  . Drug Use: No  . Sexual Activity: Not on file   Other Topics Concern  . Not on file   Social History Narrative  . No narrative on file     Objective: BP 139/82  Pulse 73  Temp(Src) 97.9 F (36.6 C) (Oral)  Wt 188 lb (85.276 kg)  General: Alert and Oriented, No Acute Distress, appears fatigued HEENT: Pupils equal, round, reactive to light. Conjunctivae clear.  External ears unremarkable, canals clear with  intact TMs with appropriate landmarks.  Middle ear appears open without effusion. Pink inferior turbinates.  Moist mucous membranes, pharynx without inflammation nor lesions.  Neck supple without palpable lymphadenopathy nor abnormal masses. Lungs: Clear to auscultation bilaterally, no wheezing/ronchi/rales.  Comfortable work of breathing. Good air movement. Cardiac: Regular rate and rhythm. Normal S1/S2.  No murmurs, rubs, nor gallops.   Extremities: No peripheral edema.  Strong peripheral pulses.  Mental Status: No depression, anxiety, nor agitation. Skin: Warm and dry.  Assessment & Plan: Cody Kane was seen today for fatigue.  Diagnoses and associated orders for this visit:  Anxiety state, unspecified - diazepam (VALIUM) 10 MG tablet; One by mouth 30 minutes prior to sleep study.  Other fatigue - Vit D  25 hydroxy (rtn osteoporosis monitoring) - Testosterone    High suspicion for obstructive sleep apnea, will look into whether or not he still has low testosterone and vitamin D deficiency to see if this is contributing to symptoms as well. He tells me he has difficulty falling asleep in beds other than his therefore one dose of Valium prior to sleep study   Return if symptoms worsen or fail to improve.

## 2013-10-29 LAB — TESTOSTERONE: Testosterone: 562 ng/dL (ref 300–890)

## 2013-10-29 LAB — VITAMIN D 25 HYDROXY (VIT D DEFICIENCY, FRACTURES): Vit D, 25-Hydroxy: 35 ng/mL (ref 30–89)

## 2013-10-31 ENCOUNTER — Telehealth: Payer: Self-pay | Admitting: Family Medicine

## 2013-10-31 DIAGNOSIS — R5383 Other fatigue: Secondary | ICD-10-CM

## 2013-10-31 DIAGNOSIS — R0683 Snoring: Secondary | ICD-10-CM

## 2013-10-31 DIAGNOSIS — G478 Other sleep disorders: Secondary | ICD-10-CM

## 2013-10-31 NOTE — Telephone Encounter (Signed)
Pt.notified

## 2013-10-31 NOTE — Telephone Encounter (Signed)
Seth Bake, Will you please let patient know that his testosterone and vitamin d level were normal. I'd still recommend a sleep study, please contact me if not contacted about scheduling within the next week.

## 2013-11-02 ENCOUNTER — Telehealth: Payer: Self-pay | Admitting: *Deleted

## 2013-11-02 NOTE — Telephone Encounter (Signed)
Pt notified. Pt states he wonders if it is his sinuses that is causing the fatigue. He states today his nose feels congested. He asked for a steroid to be sent to his pharm. I aksed if he had tried any decongestants and pt denied this. He said that he may not get any sleep if he does that because of side effects decongestants tend to have. i advised him that steroid can have the same side effects to cause him not to sleep. Pt agreed to call his insurance co an he will let us know what he finds out

## 2013-11-02 NOTE — Telephone Encounter (Signed)
Pt has been scheduled for a sleep study but it wont be until 10-22. Pt wants to know if there is anything that could be done in the meantime.Please advise

## 2013-11-02 NOTE — Telephone Encounter (Signed)
Yes,  A less accurate option would be to have this done at home.  Some insurance plans will not cover this though and it gets costly out of pocket.  I would encourage him to check with his insurance company to see if he has coverage for a home sleep study for the diagnoses of malaise and fatigue, non-restorative sleep, excessive daytime sleepiness. I have no way of knowing how much his plan would cover a home test but if it's affordable let me know and we'll arrange it at home.

## 2013-11-11 ENCOUNTER — Other Ambulatory Visit: Payer: Self-pay | Admitting: Urology

## 2013-11-21 ENCOUNTER — Telehealth: Payer: Self-pay | Admitting: Family Medicine

## 2013-11-21 ENCOUNTER — Encounter: Payer: Self-pay | Admitting: Family Medicine

## 2013-11-21 MED ORDER — PREDNISONE 20 MG PO TABS: ORAL_TABLET | ORAL | Status: DC

## 2013-11-21 NOTE — Telephone Encounter (Signed)
Seth Bake, Will you please let patient know that his insurance plan has denied coverage of a sleep study at a sleep center.  They have recommended that he have this done in his home therefore I'll fill out a referral through [employer] (in your inbox).  Please let me know if not contacted about scheduling this in home sleep test in the next week.

## 2013-11-21 NOTE — Telephone Encounter (Signed)
Pt.notified

## 2013-11-28 ENCOUNTER — Encounter (HOSPITAL_BASED_OUTPATIENT_CLINIC_OR_DEPARTMENT_OTHER): Payer: Self-pay | Admitting: *Deleted

## 2013-11-28 NOTE — Progress Notes (Addendum)
NPO AFTER MN. ARRIVE AT 0630. NEEDS HG. WILL TAKE PRILOSEC AM DOS W/ SIPS OF WATER AND DO FLEET ENEMA HS BEFORE DOS. CALLED [facility] AND LM ABOUT PSA ORDERED IF STILL NEEDED PER, PT REQUEST , HAD PSA DONE LAST WEEK IN OFFICE.  RECEIVED CALL FROM [facility] PER DR ESKRIDGE D/C PSA ORDER, NOT NEEDED.

## 2013-12-01 NOTE — Anesthesia Preprocedure Evaluation (Signed)
Anesthesia Evaluation  Patient identified by MRN, date of birth, ID band Patient awake    Reviewed: Allergy & Precautions, H&P , NPO status , Patient's Chart, lab work & pertinent test results  Airway Mallampati: II TM Distance: >3 FB Neck ROM: Full    Dental no notable dental hx.    Pulmonary former smoker,  breath sounds clear to auscultation  Pulmonary exam normal       Cardiovascular negative cardio ROS  Rhythm:Regular Rate:Normal     Neuro/Psych negative neurological ROS  negative psych ROS   GI/Hepatic Neg liver ROS, GERD-  Medicated,  Endo/Other  negative endocrine ROS  Renal/GU negative Renal ROS     Musculoskeletal  (+) Arthritis -,   Abdominal   Peds  Hematology negative hematology ROS (+)   Anesthesia Other Findings   Reproductive/Obstetrics negative OB ROS                           Anesthesia Physical Anesthesia Plan  ASA: II  Anesthesia Plan: General   Post-op Pain Management:    Induction: Intravenous  Airway Management Planned: LMA  Additional Equipment:   Intra-op Plan:   Post-operative Plan: Extubation in OR  Informed Consent: I have reviewed the patients History and Physical, chart, labs and discussed the procedure including the risks, benefits and alternatives for the proposed anesthesia with the patient or authorized representative who has indicated his/her understanding and acceptance.   Dental advisory given  Plan Discussed with: CRNA  Anesthesia Plan Comments:         Anesthesia Quick Evaluation

## 2013-12-02 ENCOUNTER — Encounter (HOSPITAL_BASED_OUTPATIENT_CLINIC_OR_DEPARTMENT_OTHER)
Admission: RE | Disposition: A | Discharge status: Home / Self Care | Payer: Self-pay | Source: Ambulatory Visit | Attending: Urology

## 2013-12-02 ENCOUNTER — Encounter (HOSPITAL_BASED_OUTPATIENT_CLINIC_OR_DEPARTMENT_OTHER): Payer: Self-pay | Admitting: *Deleted

## 2013-12-02 ENCOUNTER — Ambulatory Visit (HOSPITAL_BASED_OUTPATIENT_CLINIC_OR_DEPARTMENT_OTHER)
Admission: RE | Admit: 2013-12-02 | Discharge: 2013-12-02 | Disposition: A | Discharge status: Home / Self Care | Payer: 59 | Source: Ambulatory Visit | Attending: Urology | Admitting: Urology

## 2013-12-02 ENCOUNTER — Ambulatory Visit (HOSPITAL_BASED_OUTPATIENT_CLINIC_OR_DEPARTMENT_OTHER): Payer: 59 | Admitting: Anesthesiology

## 2013-12-02 ENCOUNTER — Encounter (HOSPITAL_BASED_OUTPATIENT_CLINIC_OR_DEPARTMENT_OTHER): Payer: 59 | Admitting: Anesthesiology

## 2013-12-02 DIAGNOSIS — R972 Elevated prostate specific antigen [PSA]: Secondary | ICD-10-CM | POA: Insufficient documentation

## 2013-12-02 DIAGNOSIS — R3912 Poor urinary stream: Secondary | ICD-10-CM | POA: Diagnosis not present

## 2013-12-02 DIAGNOSIS — N423 Dysplasia of prostate: Secondary | ICD-10-CM | POA: Insufficient documentation

## 2013-12-02 DIAGNOSIS — R351 Nocturia: Secondary | ICD-10-CM | POA: Insufficient documentation

## 2013-12-02 DIAGNOSIS — Z8042 Family history of malignant neoplasm of prostate: Secondary | ICD-10-CM | POA: Insufficient documentation

## 2013-12-02 DIAGNOSIS — N138 Other obstructive and reflux uropathy: Secondary | ICD-10-CM

## 2013-12-02 DIAGNOSIS — R3915 Urgency of urination: Secondary | ICD-10-CM | POA: Diagnosis not present

## 2013-12-02 DIAGNOSIS — N401 Enlarged prostate with lower urinary tract symptoms: Secondary | ICD-10-CM | POA: Insufficient documentation

## 2013-12-02 DIAGNOSIS — R3911 Hesitancy of micturition: Secondary | ICD-10-CM | POA: Diagnosis not present

## 2013-12-02 DIAGNOSIS — Z79899 Other long term (current) drug therapy: Secondary | ICD-10-CM | POA: Diagnosis not present

## 2013-12-02 HISTORY — DX: Unspecified symptoms and signs involving the genitourinary system: R39.9

## 2013-12-02 HISTORY — DX: Elevated prostate specific antigen (PSA): R97.20

## 2013-12-02 HISTORY — DX: Benign prostatic hyperplasia without lower urinary tract symptoms: N40.0

## 2013-12-02 HISTORY — PX: PROSTATE BIOPSY: SHX241

## 2013-12-02 HISTORY — PX: CYSTOSCOPY: SHX5120

## 2013-12-02 LAB — POCT HEMOGLOBIN-HEMACUE: Hemoglobin: 13.8 g/dL (ref 13.0–17.0)

## 2013-12-02 SURGERY — CYSTOSCOPY
Anesthesia: General | Site: Prostate

## 2013-12-02 MED ORDER — LIDOCAINE HCL (CARDIAC) 20 MG/ML IV SOLN
INTRAVENOUS | Status: DC | PRN
  Administered 2013-12-02: 60 mg via INTRAVENOUS

## 2013-12-02 MED ORDER — OXYCODONE HCL 5 MG PO TABS
5.0000 mg | ORAL_TABLET | Freq: Once | ORAL | Status: AC | PRN
  Administered 2013-12-02: 5 mg via ORAL
  Filled 2013-12-02: qty 1

## 2013-12-02 MED ORDER — CEFAZOLIN SODIUM-DEXTROSE 2-3 GM-% IV SOLR
2.0000 g | INTRAVENOUS | Status: AC
  Administered 2013-12-02: 2 g via INTRAVENOUS
  Filled 2013-12-02: qty 50

## 2013-12-02 MED ORDER — FENTANYL CITRATE 0.05 MG/ML IJ SOLN
INTRAMUSCULAR | Status: AC
  Filled 2013-12-02: qty 6

## 2013-12-02 MED ORDER — HYDROMORPHONE HCL 1 MG/ML IJ SOLN
0.2500 mg | INTRAMUSCULAR | Status: DC | PRN
  Filled 2013-12-02: qty 1

## 2013-12-02 MED ORDER — OXYCODONE HCL 5 MG/5ML PO SOLN
5.0000 mg | Freq: Once | ORAL | Status: AC | PRN
  Filled 2013-12-02: qty 5

## 2013-12-02 MED ORDER — LACTATED RINGERS IV SOLN
INTRAVENOUS | Status: DC
Start: 2013-12-02 — End: 2013-12-02
  Administered 2013-12-02 (×2): via INTRAVENOUS
  Filled 2013-12-02: qty 1000

## 2013-12-02 MED ORDER — DEXAMETHASONE SODIUM PHOSPHATE 4 MG/ML IJ SOLN
INTRAMUSCULAR | Status: DC | PRN
  Administered 2013-12-02: 8 mg via INTRAVENOUS

## 2013-12-02 MED ORDER — LIDOCAINE HCL 2 % IJ SOLN
INTRAMUSCULAR | Status: DC | PRN
  Administered 2013-12-02: 10 mL

## 2013-12-02 MED ORDER — FENTANYL CITRATE 0.05 MG/ML IJ SOLN
INTRAMUSCULAR | Status: DC | PRN
  Administered 2013-12-02: 50 ug via INTRAVENOUS

## 2013-12-02 MED ORDER — MIDAZOLAM HCL 5 MG/5ML IJ SOLN
INTRAMUSCULAR | Status: DC | PRN
  Administered 2013-12-02: 2 mg via INTRAVENOUS

## 2013-12-02 MED ORDER — MEPERIDINE HCL 25 MG/ML IJ SOLN
6.2500 mg | INTRAMUSCULAR | Status: DC | PRN
  Filled 2013-12-02: qty 1

## 2013-12-02 MED ORDER — CEFAZOLIN SODIUM-DEXTROSE 2-3 GM-% IV SOLR
INTRAVENOUS | Status: AC
  Filled 2013-12-02: qty 50

## 2013-12-02 MED ORDER — FLEET ENEMA 7-19 GM/118ML RE ENEM
1.0000 | ENEMA | Freq: Once | RECTAL | Status: AC
  Administered 2013-12-02: 1 via RECTAL
  Filled 2013-12-02: qty 1

## 2013-12-02 MED ORDER — PROMETHAZINE HCL 25 MG/ML IJ SOLN
6.2500 mg | INTRAMUSCULAR | Status: DC | PRN
  Filled 2013-12-02: qty 1

## 2013-12-02 MED ORDER — STERILE WATER FOR IRRIGATION IR SOLN
Status: DC | PRN
  Administered 2013-12-02: 3000 mL

## 2013-12-02 MED ORDER — ONDANSETRON HCL 4 MG/2ML IJ SOLN
INTRAMUSCULAR | Status: DC | PRN
Start: 2013-12-02 — End: 2013-12-02
  Administered 2013-12-02: 4 mg via INTRAVENOUS

## 2013-12-02 MED ORDER — MIDAZOLAM HCL 2 MG/2ML IJ SOLN
INTRAMUSCULAR | Status: AC
  Filled 2013-12-02: qty 2

## 2013-12-02 MED ORDER — EPHEDRINE SULFATE 50 MG/ML IJ SOLN
INTRAMUSCULAR | Status: DC | PRN
  Administered 2013-12-02: 10 mg via INTRAVENOUS
  Administered 2013-12-02: 5 mg via INTRAVENOUS

## 2013-12-02 MED ORDER — CEFAZOLIN SODIUM 1-5 GM-% IV SOLN
1.0000 g | INTRAVENOUS | Status: DC
  Filled 2013-12-02: qty 50

## 2013-12-02 MED ORDER — PROPOFOL 10 MG/ML IV BOLUS
INTRAVENOUS | Status: DC | PRN
  Administered 2013-12-02: 200 mg via INTRAVENOUS

## 2013-12-02 MED ORDER — OXYCODONE HCL 5 MG PO TABS
ORAL_TABLET | ORAL | Status: AC
  Filled 2013-12-02: qty 1

## 2013-12-02 SURGICAL SUPPLY — 30 items
BAG DRAIN URO-CYSTO SKYTR STRL (DRAIN) ×4 IMPLANT
BAG URINE DRAINAGE (UROLOGICAL SUPPLIES) ×4 IMPLANT
CANISTER SUCT LVC 12 LTR MEDI- (MISCELLANEOUS) ×4 IMPLANT
CATH FOLEY 2WAY SLVR  5CC 16FR (CATHETERS) ×2
CATH FOLEY 2WAY SLVR 5CC 16FR (CATHETERS) ×2 IMPLANT
CLOTH BEACON ORANGE TIMEOUT ST (SAFETY) ×4 IMPLANT
DRAPE CAMERA CLOSED 9X96 (DRAPES) ×4 IMPLANT
DRESSING TELFA 8X3 (GAUZE/BANDAGES/DRESSINGS) ×4 IMPLANT
ELECT REM PT RETURN 9FT ADLT (ELECTROSURGICAL)
ELECTRODE REM PT RTRN 9FT ADLT (ELECTROSURGICAL) IMPLANT
GLOVE BIO SURGEON STRL SZ 6.5 (GLOVE) ×3 IMPLANT
GLOVE BIO SURGEON STRL SZ7 (GLOVE) ×4 IMPLANT
GLOVE BIO SURGEON STRL SZ7.5 (GLOVE) ×4 IMPLANT
GLOVE BIO SURGEONS STRL SZ 6.5 (GLOVE) ×1
GLOVE BIOGEL PI IND STRL 6.5 (GLOVE) ×2 IMPLANT
GLOVE BIOGEL PI INDICATOR 6.5 (GLOVE) ×2
GLOVE SURG SS PI 8.0 STRL IVOR (GLOVE) IMPLANT
GOWN STRL REUS W/ TWL XL LVL3 (GOWN DISPOSABLE) ×6 IMPLANT
GOWN STRL REUS W/TWL XL LVL3 (GOWN DISPOSABLE) ×6
HOLDER FOLEY CATH W/STRAP (MISCELLANEOUS) ×4 IMPLANT
NEEDLE BLUNT 18X1 FOR OR ONLY (NEEDLE) ×4 IMPLANT
NEEDLE HYPO 22GX1.5 SAFETY (NEEDLE) IMPLANT
NEEDLE SPNL 22GX7 QUINCKE BK (NEEDLE) ×4 IMPLANT
NS IRRIG 500ML POUR BTL (IV SOLUTION) IMPLANT
PACK CYSTO (CUSTOM PROCEDURE TRAY) ×4 IMPLANT
SURGILUBE 2OZ TUBE FLIPTOP (MISCELLANEOUS) ×4 IMPLANT
SYR CONTROL 10ML LL (SYRINGE) ×4 IMPLANT
TOWEL OR 17X24 6PK STRL BLUE (TOWEL DISPOSABLE) ×4 IMPLANT
UNDERPAD 30X30 INCONTINENT (UNDERPADS AND DIAPERS) ×4 IMPLANT
WATER STERILE IRR 3000ML UROMA (IV SOLUTION) ×4 IMPLANT

## 2013-12-02 NOTE — Discharge Instructions (Signed)
Prostate Biopsy, Cystoscopy, Care After Refer to this sheet in the next few weeks. These instructions provide you with information on caring for yourself after your procedure. Your caregiver may also give you more specific instructions. Your treatment has been planned according to current medical practices, but problems sometimes occur. Call your caregiver if you have any problems or questions after your procedure. HOME CARE INSTRUCTIONS  Things you can do to ease any discomfort after your procedure include:  Drinking enough water and fluids to keep your urine clear or pale yellow.  Taking a warm bath to relieve any burning feelings. SEEK IMMEDIATE MEDICAL CARE IF:   You have an increase in blood in your urine.  You notice blood clots in your urine.  You have difficulty passing urine.  You have the chills.  You have abdominal pain.  You have a fever or persistent symptoms for more than 2-3 days.  You have a fever and your symptoms suddenly get worse. MAKE SURE YOU:   Understand these instructions.  Will watch your condition.  Will get help right away if you are not doing well or get worse. Document Released: 09/06/2004 Document Revised: 10/20/2012 Document Reviewed: 08/11/2011 [facility] Patient Information 2015 [place], [place]. This information is not intended to replace advice given to you by your health care provider. Make sure you discuss any questions you have with your health care provider.     Post Anesthesia Home Care Instructions  Activity: Get plenty of rest for the remainder of the day. A responsible adult should stay with you for 24 hours following the procedure.  For the next 24 hours, DO NOT: -Drive a car -Paediatric nurse -Drink alcoholic beverages -Take any medication unless instructed by your physician -Make any legal decisions or sign important papers.  Meals: Start with liquid foods such as gelatin or soup. Progress to regular foods as tolerated.  Avoid greasy, spicy, heavy foods. If nausea and/or vomiting occur, drink only clear liquids until the nausea and/or vomiting subsides. Call your physician if vomiting continues.  Special Instructions/Symptoms: Your throat may feel dry or sore from the anesthesia or the breathing tube placed in your throat during surgery. If this causes discomfort, gargle with warm salt water. The discomfort should disappear within 24 hours.

## 2013-12-02 NOTE — H&P (Signed)
History of Present Illness      F/u - elevated PSA referred by Dr. Dianah Field.     1- elevated PSA - Jul 2015 - pt seen last mo when PSA was noted to be elevated. He has never had a prostate biopsy before. His father had prostate cancer which was diagnosed in his 8s but may have been metastatic.  Prior PSA screening -  -June 2014 PSA 4.7  -June 2015 PSA 5.03, 23% free --> 18% PCa risk  -Aug 2015 73 g prostate with a PSA density of 0.07.    2-BPH - Jul 2015 - Patient has a long history of BPH. He was told at a young age he had an enlarged prostate. He's been on tamsulosin for a year for weak stream and nocturia. He noticed tamsulosin was effective initially but it seems to be wearing off. He complains of urgency, nocturia, intermittent stream, hesitancy, weak stream. Not associated with dysuria, UTI or gross hematuria. PVR was 57 ml. he was started on finasteride.  -Aug 2015 - patient could not tolerate finasteride and stopped. He had cognitive impairment and felt depressed. He also felt lightheaded dizzy and weak. Although his been on tamsulosin for about a year he never felt the symptoms before. He continues tamsulosin.      September 2015 interval history -  The patient returns to review urodynamics and to consider cystoscopy. It has been revealed that he has severe vasovagal episodes with procedures and he is very anxious today about cystoscopy so we may hold off.    He continues tamsulosin. He complains of weak stream.     We went over his urodynamics which showed a hypersensitive lower capacity bladder. His first void was off urgency. He did void with an obstructed flow pattern, 130 cm of water pressure with a flow of 2-6 cc/s and left about 100 mL postvoid. His EMG was normal. I reviewed all the images and tracing.            Past Medical History Problems  1. History of arthritis (V13.4) 2. History of esophageal reflux (V12.79) 3. History of  hypercholesterolemia (V12.29)  Surgical History Problems  1. History of Surgery Of Male Genitalia Vasectomy  Current Meds 1. Atorvastatin Calcium 40 MG Oral Tablet;  Therapy: (Recorded:29Jul2015) to Recorded 2. CeleBREX 200 MG Oral Capsule;  Therapy: (Recorded:29Jul2015) to Recorded 3. PriLOSEC 40 MG Oral Capsule Delayed Release;  Therapy: (Recorded:29Jul2015) to Recorded 4. Tamsulosin HCl - 0.4 MG Oral Capsule;  Therapy: (Recorded:29Jul2015) to Recorded 5. Valtrex 1 GM Oral Tablet;  Therapy: (Recorded:29Jul2015) to Recorded  Allergies Medication  1. No Known Drug Allergies  Family History Problems  1. Family history of Deceased : Mother, Father 2. Family history of colon cancer (V16.0) : Mother 3. Family history of malignant neoplasm of prostate (W43.15) : Father  Social History Problems  1. Alcohol use (V49.89) 2. Caffeine use (V49.89) 3. Former smoker (V15.82) 4. Married 5. Number of children 6. Occupation  Vitals Vital Signs [Data Includes: Last 1 Day]  Recorded: 08Sep2015 02:19PM  Blood Pressure: 116 / 79 Temperature: 98.2 F Heart Rate: 81  Physical Exam Constitutional: Well nourished and well developed . No acute distress.  Neuro/Psych:. Mood and affect are appropriate.    Results/Data Urine [Data Includes: Last 1 Day]   [id number]  COLOR AMBER   APPEARANCE CLEAR   SPECIFIC GRAVITY 1.025   pH 6.0   GLUCOSE NEG mg/dL  BILIRUBIN NEG   KETONE NEG mg/dL  BLOOD NEG  PROTEIN NEG mg/dL  UROBILINOGEN 1 mg/dL  NITRITE NEG   LEUKOCYTE ESTERASE NEG    Assessment Assessed  1. BPH (benign prostatic hypertrophy) with urinary obstruction (600.01,599.69) 2. Elevated PSA (790.93)  Plan BPH (benign prostatic hypertrophy) with urinary obstruction  1. Follow-up Month x 3 Office  Follow-up  Status: Hold For - Date of Service  Requested for:  29Oct2015 2. Follow-up Schedule Surgery Office  Follow-up  Status: Hold For - Appointment   Requested for:  08Sep2015 Elevated PSA  3. PSA REFLEX TO FREE; Status:Hold For - Specimen/Data Collection,Appointment;  Requested [id number];   I did repeat the PSA today and will plan for cystoscopy, TRUS prostate biopsy in the OR.   Discussion/Summary Elevated PSA-the patient wants to consider outlet procedure but we discussed outlet procedures differ from radical prostatectomy. I think given his age and family history we needed to make sure there is no prostate cancer before performing an outlet procedure because the patient would be interested in radical prostatectomy if needed. Given his history of office procedures I'll set him up in the operating room for transrectal ultrasound prostate biopsy under anesthesia. We'll also do cystoscopy while he is asleep. We discussed alternatives such as proceeding with these procedures in the office but the patient would like to be under anesthesia. This is reasonable.    BPH-as above.     Signatures Electronically signed by : Festus Aloe, M.D.; Nov 08 2013  2:49PM EST

## 2013-12-02 NOTE — Transfer of Care (Signed)
Immediate Anesthesia Transfer of Care Note  Patient: Cody Kane  Procedure(s) Performed: Procedure(s) (LRB): CYSTOSCOPY (N/A) BIOPSY TRANSRECTAL ULTRASONIC PROSTATE (TUBP) (N/A)  Patient Location: PACU  Anesthesia Type: General  Level of Consciousness: awake, oriented, sedated and patient cooperative  Airway & Oxygen Therapy: Patient Spontanous Breathing and Patient connected to face mask oxygen  Post-op Assessment: Report given to PACU RN and Post -op Vital signs reviewed and stable  Post vital signs: Reviewed and stable  Complications: No apparent anesthesia complications

## 2013-12-02 NOTE — Interval H&P Note (Signed)
History and Physical Interval Note:  12/02/2013 8:06 AM  Cody Kane  has presented today for surgery, with the diagnosis of Benign Prostatic Hypertrophy, Elevated Prostatic Specific Antigen  The various methods of treatment have been discussed with the patient and family. After consideration of risks, benefits and other options for treatment, the patient has consented to  Procedure(s): CYSTOSCOPY (N/A) BIOPSY TRANSRECTAL ULTRASONIC PROSTATE (TUBP) (N/A) as a surgical intervention .  The patient's history has been reviewed, patient examined, no change in status, stable for surgery.  I have reviewed the patient's chart and labs.  Questions were answered to the patient's satisfaction.  Pt complained of nocturia in addition to weak stream and incomplete emptying. We discussed over the long run nocturia may persist despite our efforts as this is a complex symptom to control and treat. Pt has had no dysuria or gross hematuria. He is on Levaquin as planned.    Festus Aloe

## 2013-12-02 NOTE — Anesthesia Procedure Notes (Signed)
Procedure Name: LMA Insertion Date/Time: 12/02/2013 8:10 AM Performed by: Denna Haggard D Pre-anesthesia Checklist: Patient identified, Emergency Drugs available, Suction available and Patient being monitored Patient Re-evaluated:Patient Re-evaluated prior to inductionOxygen Delivery Method: Circle System Utilized Preoxygenation: Pre-oxygenation with 100% oxygen Intubation Type: IV induction Ventilation: Mask ventilation without difficulty LMA: LMA inserted LMA Size: 4.0 Number of attempts: 1 Airway Equipment and Method: bite block Placement Confirmation: positive ETCO2 Tube secured with: Tape Dental Injury: Teeth and Oropharynx as per pre-operative assessment

## 2013-12-02 NOTE — Op Note (Signed)
Preoperative diagnosis:  1. BPH with LUTS 2. Elevated PSA  Postoperative diagnosis: 1. Same  Procedure(s): 1. Cystoscopy 2. Transrectal ultrasound guided prostate biopsy including DRE   Surgeon: Dr. Festus Aloe Asst: Dr. Amaryllis Dyke  Anesthesia: General  Complications: None  EBL: Minimal  Specimens: 12-core prostate biopsy  Intraoperative findings: Cystoscopy: normal urethra, trilobar hypertrophy with 100% visual coaptation. Median lobe obstructed clear view of the ureteral orifices. No tumors, lesions, or stones in the bladder.  DRE - prostate benign, no hard area or nodules, all landmarks preserved.  TRUS: clear enlargement of the transition zone. SVs and vas were visible bilaterally. Volume was ~60cc.    Indication: 57 yo male with obstructed voiding pattern on VUDS, lower urinary tract symptoms, elevated PSA and family history of prostate cancer.  Description of procedure:  The patient was verified in pre-op holding area and brought to the OR. General anesthesia was induced and he was prepped and draped in the usual sterile fashion. Timeout was called and the flexible cystoscope was advanced with ample lubrication. Findings as above.   The patient was then turned in the lateral decubitus position and a rectal exam was performed. No induration or nodules were noted. The ultrasound probe was placed with ample lubrication and the prostate was visualized. 5cc of local anesthesia was injected bilaterally into the neurovascular bundle. Volume was measured as above. 12 core biopsies were performed in the standard template. No bleeding was noted. The patient was awoken from anesthesia having tolerated the procedure well.  Attending attestation: I was present and scrubbed for the entire procedure. I performed cystoscopy, DRE and prostate U/S. I directed the prostate biopsies.

## 2013-12-02 NOTE — Anesthesia Postprocedure Evaluation (Signed)
Anesthesia Post Note  Patient: Cody Kane  Procedure(s) Performed: Procedure(s) (LRB): CYSTOSCOPY (N/A) BIOPSY TRANSRECTAL ULTRASONIC PROSTATE (TUBP) (N/A)  Anesthesia type: General  Patient location: PACU  Post pain: Pain level controlled  Post assessment: Post-op Vital signs reviewed  Last Vitals: BP 138/79  Pulse 70  Temp(Src) 36.8 C (Oral)  Resp 16  Ht 5\' 10"  (1.778 m)  Wt 191 lb (86.637 kg)  BMI 27.41 kg/m2  SpO2 100%  Post vital signs: Reviewed  Level of consciousness: sedated  Complications: No apparent anesthesia complications

## 2013-12-05 ENCOUNTER — Encounter (HOSPITAL_BASED_OUTPATIENT_CLINIC_OR_DEPARTMENT_OTHER): Payer: Self-pay | Admitting: Urology

## 2013-12-08 ENCOUNTER — Telehealth: Payer: Self-pay | Admitting: *Deleted

## 2013-12-08 ENCOUNTER — Other Ambulatory Visit: Payer: Self-pay | Admitting: Urology

## 2013-12-08 NOTE — Telephone Encounter (Signed)
Pt called and states he is still feeling dizziness and fatigue. He states he is not able to do the sleep study because his insurance doesn't cover it. He did finish the last course of prednisone that was rx'ed. I left message on pt's vm that he should schedule a f/u appt

## 2013-12-09 ENCOUNTER — Encounter: Payer: Self-pay | Admitting: Family Medicine

## 2013-12-09 ENCOUNTER — Ambulatory Visit (INDEPENDENT_AMBULATORY_CARE_PROVIDER_SITE_OTHER): Payer: 59 | Admitting: Family Medicine

## 2013-12-09 VITALS — BP 143/83 | HR 89 | Ht 70.0 in | Wt 193.0 lb

## 2013-12-09 DIAGNOSIS — B9689 Other specified bacterial agents as the cause of diseases classified elsewhere: Secondary | ICD-10-CM

## 2013-12-09 DIAGNOSIS — J329 Chronic sinusitis, unspecified: Secondary | ICD-10-CM

## 2013-12-09 DIAGNOSIS — A499 Bacterial infection, unspecified: Secondary | ICD-10-CM

## 2013-12-09 DIAGNOSIS — R5382 Chronic fatigue, unspecified: Secondary | ICD-10-CM

## 2013-12-09 MED ORDER — AMOXICILLIN-POT CLAVULANATE 500-125 MG PO TABS: ORAL_TABLET | ORAL | Status: AC

## 2013-12-09 NOTE — Progress Notes (Signed)
CC: Cody Kane is a 58 y.o. male is here for Headache and Otalgia   Subjective: HPI:  Complains of continued fatigue described as moderate to severe in severity present all hours of the day. He is experiencing nonrestorative sleep, feels like he could nap anytime of the day, feels like he has absolutely no stamina. Symptoms were slightly improved when taking prednisone for sinus pressure last month but only for 2 days and only to a mild degree. Symptoms are persistent throughout the day nothing seems to make them better or worse other than that described above. He had a sleep study done a little over a week ago at home however results are still not available at this time. Denies shortness of breath, chest pain, wheezing, nor irregular heartbeat. Denies any recent or remote subjective depression.  Complains of facial pressure localized above the eyes and beneath the eyes that is symmetric and was slightly improved with prednisone and Flonase. Symptoms are moderate to severe in severity and other than above nothing seems to make them better or worse. Pressure radiates into the ears bilaterally. Symptoms have been present for matter of months. He denies fevers, chills, nasal congestion, motor or sensory disturbances other than fatigue described above. Denies cough, postnasal drip nor sore throat. "Just feels like there's something up in my nose"   Review Of Systems Outlined In HPI  Past Medical History  Diagnosis Date  . GERD (gastroesophageal reflux disease)   . Arthritis   . Elevated PSA   . BPH (benign prostatic hypertrophy)   . Lower urinary tract symptoms (LUTS)   . At risk for sleep apnea     STOP-BANG= 5     SLEEP STUDY ALREADY ORDERED BY PCP SCHEDULED FOR 11-28-2013    Past Surgical History  Procedure Laterality Date  . Colonoscopy  2011  . Cystoscopy N/A 12/02/2013    Procedure: CYSTOSCOPY;  Surgeon: Festus Aloe, MD;  Location: [facility];  Service: Urology;   Laterality: N/A;  . Prostate biopsy N/A 12/02/2013    Procedure: BIOPSY TRANSRECTAL ULTRASONIC PROSTATE (TUBP);  Surgeon: Festus Aloe, MD;  Location: [facility];  Service: Urology;  Laterality: N/A;   Family History  Problem Relation Age of Onset  . Cancer Mother     colon  . Cancer Father     lung    History   Social History  . Marital Status: Married    Spouse Name: N/A    Number of Children: N/A  . Years of Education: N/A   Occupational History  . Not on file.   Social History Main Topics  . Smoking status: Former Smoker -- 1.00 packs/day for 25 years    Types: Cigarettes    Quit date: 05/01/2009  . Smokeless tobacco: Never Used  . Alcohol Use: Yes     Comment: occasional  . Drug Use: No  . Sexual Activity: Not on file   Other Topics Concern  . Not on file   Social History Narrative  . No narrative on file     Objective: BP 143/83  Pulse 89  Ht 5\' 10"  (1.778 m)  Wt 193 lb (87.544 kg)  BMI 27.69 kg/m2  General: Alert and Oriented, No Acute Distress HEENT: Pupils equal, round, reactive to light. Conjunctivae clear.  External ears unremarkable, canals clear with intact TMs with appropriate landmarks.  Middle ear appears open without effusion. Pink inferior turbinates.  Moist mucous membranes, pharynx without inflammation nor lesions.  Neck supple without  palpable lymphadenopathy nor abnormal masses. Lungs: Clear to auscultation bilaterally, no wheezing/ronchi/rales.  Comfortable work of breathing. Good air movement. Cardiac: Regular rate and rhythm. Normal S1/S2.  No murmurs, rubs, nor gallops.   Extremities: No peripheral edema.  Strong peripheral pulses.  Mental Status: No depression, anxiety, nor agitation. Skin: Warm and dry.  Assessment & Plan: Kelyn was seen today for headache and otalgia.  Diagnoses and associated orders for this visit:  Bacterial sinusitis - amoxicillin-clavulanate (AUGMENTIN) 500-125 MG per tablet; Take one by  mouth every 8 hours for ten total days. - Ambulatory referral to ENT  Chronic fatigue    Chronic fatigue: I still have a high suspicion for sleep apnea, we contacted a [employer] representative and confirmed the results are not available as of today, the data has not been downloaded yet from his device. Actual sinusitis: Start Augmentin, joint decision for ENT referral as he would like to to have his nasal passages fully visualized   Return if symptoms worsen or fail to improve.

## 2013-12-15 ENCOUNTER — Telehealth: Payer: Self-pay | Admitting: Family Medicine

## 2013-12-15 ENCOUNTER — Encounter: Payer: Self-pay | Admitting: Family Medicine

## 2013-12-15 DIAGNOSIS — G4733 Obstructive sleep apnea (adult) (pediatric): Secondary | ICD-10-CM | POA: Insufficient documentation

## 2013-12-15 DIAGNOSIS — J329 Chronic sinusitis, unspecified: Secondary | ICD-10-CM | POA: Insufficient documentation

## 2013-12-15 NOTE — Telephone Encounter (Signed)
Seth Bake, Will you please let patient know that his sleep test confirmed that he has sleep apnea which is mild in severity.  What I would recommend is to have his ENT visit first and if there is no need for any surgical procedure on his nasal passages then to go through with starting CPAP therapy at home.  If a surgical procedure is planned, it would be best to have the surgery first before considering CPAP therapy.  Please let me know which direction ENT plans on taking this so I can provide a Rx when needed.

## 2013-12-15 NOTE — Telephone Encounter (Signed)
Pt notified and he states he saw the ENT yesterday and there are no "nasal concerns or anything bothersome" per the patient. No surgery needed. The ENT rx a nasal steroid from a compounding pharm

## 2013-12-16 NOTE — Telephone Encounter (Signed)
Spoke with pt and he wants to go ahead and get set up with CPAP. He tells me [facility] is not in network with him but [employer] and [facility] is. He said he spoke with someone there who told him they could transfer his info. I spoke with Melissa at [facility] and she states it was already sent to [place]

## 2013-12-16 NOTE — Telephone Encounter (Signed)
I notified pt.

## 2013-12-16 NOTE — Telephone Encounter (Signed)
I'd recommend that if his fatigue is no better after 2 weeks of using his nasal steroid he should contact me or his PCP Dr. Darene Lamer so that one of [place] can help arrange CPAP therapy for his sleep apnea.

## 2013-12-20 ENCOUNTER — Encounter: Payer: Self-pay | Admitting: Family Medicine

## 2013-12-20 ENCOUNTER — Telehealth: Payer: Self-pay | Admitting: Family Medicine

## 2013-12-20 DIAGNOSIS — G4733 Obstructive sleep apnea (adult) (pediatric): Secondary | ICD-10-CM

## 2013-12-20 MED ORDER — AMBULATORY NON FORMULARY MEDICATION: Status: DC

## 2013-12-20 NOTE — Telephone Encounter (Signed)
Seth Bake,  Will you please: 1.) Fax patient's Rx for auto-cpap to [facility], he told me today that he'll forward all necessary items to [facility] if his company is not in-network. (rx in your inbox) 2.) Call patient and let him know that I got his message, he would like a copy of his sleep study results and a paper copy of his auto-cpap Rx.  Also can you relay to him that I've sent an Rx/order for this to [facility] to help find a resp. Supply company that is covered by his insurance.

## 2013-12-22 ENCOUNTER — Encounter (HOSPITAL_BASED_OUTPATIENT_CLINIC_OR_DEPARTMENT_OTHER): Payer: 59

## 2013-12-22 NOTE — Telephone Encounter (Signed)
Spoke with pt and he doesn't want anything to go to [facility]. He wants everything to go to [facility].

## 2013-12-23 NOTE — Telephone Encounter (Signed)
[facility]  [phone]. They have his info already. They are processing the order and obtaining a prior auth

## 2014-01-06 ENCOUNTER — Encounter (HOSPITAL_BASED_OUTPATIENT_CLINIC_OR_DEPARTMENT_OTHER): Payer: Self-pay | Admitting: *Deleted

## 2014-01-06 NOTE — Progress Notes (Signed)
NPO AFTER MN. ARRIVE AT 4290. NEEDS HG. WILL TAKE PRILOSEC AM DOS W/ SIPS OF WATER.

## 2014-01-09 NOTE — H&P (Signed)
H&P  Pt presents for GL PVP.   History of Present Illness:   Patient presents for Laser Photoselective vaporization of prostate due to BPH with LUTS and BOO on UDS. He has been well. Denies dysuria, fever, CP or SOB.         Prior GU hx:    1- elevated PSA - Jul 2015 - pt seen last mo when PSA was noted to be elevated. He has never had a prostate biopsy before. His father had prostate cancer which was diagnosed in his 74s but may have been metastatic.  Prior PSA screening -  -June 2014 PSA 4.7  -June 2015 PSA 5.03, 23% free --> 18% PCa risk  -Aug 2015 73 g prostate with a PSA density of 0.07.  -Sep 2015 DRE nl, prostate bx benign, 60 g prostate     2-BPH - Jul 2015 - Patient has a long history of BPH. He was told at a young age he had an enlarged prostate. He's been on tamsulosin for a year for weak stream and nocturia. He noticed tamsulosin was effective initially but it seems to be wearing off. He complains of urgency, nocturia, intermittent stream, hesitancy, weak stream. Not associated with dysuria, UTI or gross hematuria. PVR was 57 ml. he was started on finasteride.  -Aug 2015 - patient could not tolerate finasteride and stopped. He had cognitive impairment and felt depressed. He also felt lightheaded dizzy and weak. Although his been on tamsulosin for about a year he never felt the symptoms before. He continues tamsulosin.  -Sept 2015 He continues tamsulosin. He complains of weak stream. UDS - hypersensitive lower capacity bladder. His first void was off urgency. He did void with an obstructed flow pattern, 130 cm of water pressure with a flow of 2-6 cc/s and left about 100 mL postvoid. His EMG was normal. I reviewed all the images and tracing.   Cystoscopy showed trilobar hypertrophy.    Past Medical History  Diagnosis Date  . GERD (gastroesophageal reflux disease)   . Arthritis   . Elevated PSA   . BPH (benign prostatic hypertrophy)   . Lower urinary tract  symptoms (LUTS)   . Mild obstructive sleep apnea     pt just had study done--  to start using cpap next week   Past Surgical History  Procedure Laterality Date  . Colonoscopy  2011  . Cystoscopy N/A 12/02/2013    Procedure: CYSTOSCOPY;  Surgeon: Festus Aloe, MD;  Location[facility];  Service: Urology;  Laterality: N/A;  . Prostate biopsy N/A 12/02/2013    Procedure: BIOPSY TRANSRECTAL ULTRASONIC PROSTATE (TUBP);  Surgeon: Festus Aloe, MD;  Location: [facility];  Service: Urology;  Laterality: N/A;    Home Medications:  No prescriptions prior to admission   Allergies: No Known Allergies  Family History  Problem Relation Age of Onset  . Cancer Mother     colon  . Cancer Father     lung   Social History:  reports that he quit smoking about 4 years ago. His smoking use included Cigarettes. He has a 25 pack-year smoking history. He has never used smokeless tobacco. He reports that he drinks alcohol. He reports that he does not use illicit drugs.  ROS: A complete review of systems was performed.  All systems are negative except for pertinent findings as noted. Review of Systems  All other systems reviewed and are negative.    Physical Exam:  Vital signs in last 24 hours:  General:  Alert and oriented, No acute distress HEENT: Normocephalic, atraumatic Neck: No JVD or lymphadenopathy Cardiovascular: Regular rate and rhythm Lungs: Regular rate and effort Abdomen: Soft, nontender, nondistended, no abdominal masses Back: No CVA tenderness Extremities: No edema Neurologic: Grossly intact  Laboratory Data:  No results found for this or any previous visit (from the past 24 hour(s)). No results found for this or any previous visit (from the past 240 hour(s)). Creatinine: No results for input(s): CREATININE in the last 168 hours.  Impression/Assessment/plan: I discussed with the patient the nature, potential benefits, risks and  alternatives to Photoselective vaporization prostate, including side effects of the proposed treatment, the likelihood of the patient achieving the goals of the procedure, and any potential problems that might occur during the procedure or recuperation. All questions answered. Patient elects to proceed. Discussed post-op foley and catheter care.    Festus Aloe

## 2014-01-09 NOTE — Anesthesia Preprocedure Evaluation (Addendum)
Anesthesia Evaluation  Patient identified by MRN, date of birth, ID band Patient awake    Reviewed: Allergy & Precautions, H&P , NPO status , Patient's Chart, lab work & pertinent test results  History of Anesthesia Complications Negative for: history of anesthetic complications  Airway Mallampati: III  TM Distance: >3 FB Neck ROM: Full    Dental  (+) Teeth Intact, Dental Advisory Given, Poor Dentition,    Pulmonary sleep apnea and Continuous Positive Airway Pressure Ventilation , former smoker,  breath sounds clear to auscultation  Pulmonary exam normal       Cardiovascular Exercise Tolerance: Good negative cardio ROS  Rhythm:Regular Rate:Normal     Neuro/Psych negative neurological ROS  negative psych ROS   GI/Hepatic Neg liver ROS, GERD-  Medicated and Controlled,  Endo/Other  negative endocrine ROS  Renal/GU negative Renal ROS  negative genitourinary   Musculoskeletal  (+) Arthritis -, Osteoarthritis,    Abdominal   Peds negative pediatric ROS (+)  Hematology negative hematology ROS (+)   Anesthesia Other Findings Discolored teeth  Reproductive/Obstetrics negative OB ROS                       Anesthesia Physical Anesthesia Plan  ASA: II  Anesthesia Plan: General   Post-op Pain Management:    Induction: Intravenous  Airway Management Planned: LMA  Additional Equipment:   Intra-op Plan:   Post-operative Plan: Extubation in OR  Informed Consent: I have reviewed the patients History and Physical, chart, labs and discussed the procedure including the risks, benefits and alternatives for the proposed anesthesia with the patient or authorized representative who has indicated his/her understanding and acceptance.   Dental advisory given  Plan Discussed with: CRNA  Anesthesia Plan Comments:         Anesthesia Quick Evaluation

## 2014-01-10 ENCOUNTER — Encounter (HOSPITAL_BASED_OUTPATIENT_CLINIC_OR_DEPARTMENT_OTHER)
Admission: RE | Disposition: A | Discharge status: Home / Self Care | Payer: Self-pay | Source: Ambulatory Visit | Attending: Urology

## 2014-01-10 ENCOUNTER — Ambulatory Visit (HOSPITAL_BASED_OUTPATIENT_CLINIC_OR_DEPARTMENT_OTHER): Payer: 59 | Admitting: Anesthesiology

## 2014-01-10 ENCOUNTER — Ambulatory Visit (HOSPITAL_BASED_OUTPATIENT_CLINIC_OR_DEPARTMENT_OTHER)
Admission: RE | Admit: 2014-01-10 | Discharge: 2014-01-10 | Disposition: A | Discharge status: Home / Self Care | Payer: 59 | Source: Ambulatory Visit | Attending: Urology | Admitting: Urology

## 2014-01-10 ENCOUNTER — Encounter (HOSPITAL_BASED_OUTPATIENT_CLINIC_OR_DEPARTMENT_OTHER): Payer: Self-pay | Admitting: *Deleted

## 2014-01-10 DIAGNOSIS — G4733 Obstructive sleep apnea (adult) (pediatric): Secondary | ICD-10-CM | POA: Diagnosis not present

## 2014-01-10 DIAGNOSIS — K219 Gastro-esophageal reflux disease without esophagitis: Secondary | ICD-10-CM | POA: Diagnosis not present

## 2014-01-10 DIAGNOSIS — N138 Other obstructive and reflux uropathy: Secondary | ICD-10-CM | POA: Diagnosis not present

## 2014-01-10 DIAGNOSIS — N401 Enlarged prostate with lower urinary tract symptoms: Secondary | ICD-10-CM | POA: Insufficient documentation

## 2014-01-10 DIAGNOSIS — M1990 Unspecified osteoarthritis, unspecified site: Secondary | ICD-10-CM | POA: Diagnosis not present

## 2014-01-10 DIAGNOSIS — Z87891 Personal history of nicotine dependence: Secondary | ICD-10-CM | POA: Diagnosis not present

## 2014-01-10 HISTORY — PX: GREEN LIGHT LASER TURP (TRANSURETHRAL RESECTION OF PROSTATE: SHX6260

## 2014-01-10 HISTORY — DX: Obstructive sleep apnea (adult) (pediatric): G47.33

## 2014-01-10 LAB — POCT HEMOGLOBIN-HEMACUE: Hemoglobin: 13.2 g/dL (ref 13.0–17.0)

## 2014-01-10 SURGERY — GREEN LIGHT LASER TURP (TRANSURETHRAL RESECTION OF PROSTATE
Anesthesia: General | Site: Prostate

## 2014-01-10 MED ORDER — MIDAZOLAM HCL 2 MG/2ML IJ SOLN
INTRAMUSCULAR | Status: AC
  Filled 2014-01-10: qty 2

## 2014-01-10 MED ORDER — SODIUM CHLORIDE 0.9 % IR SOLN
Status: DC | PRN
  Administered 2014-01-10 (×5): 3000 mL
  Administered 2014-01-10: 1000 mL

## 2014-01-10 MED ORDER — ACETAMINOPHEN 10 MG/ML IV SOLN
INTRAVENOUS | Status: DC | PRN
  Administered 2014-01-10: 1000 mg via INTRAVENOUS

## 2014-01-10 MED ORDER — FENTANYL CITRATE 0.05 MG/ML IJ SOLN
INTRAMUSCULAR | Status: AC
  Filled 2014-01-10: qty 2

## 2014-01-10 MED ORDER — PROPOFOL 10 MG/ML IV BOLUS
INTRAVENOUS | Status: DC | PRN
  Administered 2014-01-10: 200 mg via INTRAVENOUS

## 2014-01-10 MED ORDER — MIDAZOLAM HCL 5 MG/5ML IJ SOLN
INTRAMUSCULAR | Status: DC | PRN
  Administered 2014-01-10: 2 mg via INTRAVENOUS

## 2014-01-10 MED ORDER — CEPHALEXIN 500 MG PO CAPS: 500.0000 mg | ORAL_CAPSULE | Freq: Every day | ORAL | Status: DC

## 2014-01-10 MED ORDER — BELLADONNA ALKALOIDS-OPIUM 16.2-60 MG RE SUPP
RECTAL | Status: AC
  Filled 2014-01-10: qty 1

## 2014-01-10 MED ORDER — ONDANSETRON HCL 4 MG/2ML IJ SOLN
4.0000 mg | Freq: Once | INTRAMUSCULAR | Status: DC | PRN
  Filled 2014-01-10: qty 2

## 2014-01-10 MED ORDER — PHENAZOPYRIDINE HCL 100 MG PO TABS
ORAL_TABLET | ORAL | Status: AC
  Filled 2014-01-10: qty 2

## 2014-01-10 MED ORDER — ONDANSETRON HCL 4 MG/2ML IJ SOLN
INTRAMUSCULAR | Status: DC | PRN
  Administered 2014-01-10: 4 mg via INTRAVENOUS

## 2014-01-10 MED ORDER — LIDOCAINE HCL (CARDIAC) 20 MG/ML IV SOLN
INTRAVENOUS | Status: DC | PRN
  Administered 2014-01-10: 80 mg via INTRAVENOUS

## 2014-01-10 MED ORDER — OXYCODONE-ACETAMINOPHEN 5-325 MG PO TABS: 1.0000 | ORAL_TABLET | Freq: Four times a day (QID) | ORAL | Status: DC | PRN

## 2014-01-10 MED ORDER — CEFAZOLIN SODIUM-DEXTROSE 2-3 GM-% IV SOLR
INTRAVENOUS | Status: AC
  Filled 2014-01-10: qty 50

## 2014-01-10 MED ORDER — CEFAZOLIN SODIUM-DEXTROSE 2-3 GM-% IV SOLR
2.0000 g | INTRAVENOUS | Status: AC
  Administered 2014-01-10: 2 g via INTRAVENOUS
  Filled 2014-01-10: qty 50

## 2014-01-10 MED ORDER — FENTANYL CITRATE 0.05 MG/ML IJ SOLN
INTRAMUSCULAR | Status: DC | PRN
  Administered 2014-01-10: 50 ug via INTRAVENOUS
  Administered 2014-01-10: 25 ug via INTRAVENOUS

## 2014-01-10 MED ORDER — FENTANYL CITRATE 0.05 MG/ML IJ SOLN
INTRAMUSCULAR | Status: AC
  Filled 2014-01-10: qty 4

## 2014-01-10 MED ORDER — LACTATED RINGERS IV SOLN
INTRAVENOUS | Status: DC
  Administered 2014-01-10 (×2): via INTRAVENOUS
  Filled 2014-01-10: qty 1000

## 2014-01-10 MED ORDER — PROMETHAZINE HCL 25 MG/ML IJ SOLN
6.2500 mg | Freq: Once | INTRAMUSCULAR | Status: DC
  Filled 2014-01-10: qty 1

## 2014-01-10 MED ORDER — CEFAZOLIN SODIUM 1-5 GM-% IV SOLN
1.0000 g | INTRAVENOUS | Status: DC
  Filled 2014-01-10: qty 50

## 2014-01-10 MED ORDER — FENTANYL CITRATE 0.05 MG/ML IJ SOLN
25.0000 ug | INTRAMUSCULAR | Status: DC | PRN
  Administered 2014-01-10 (×2): 25 ug via INTRAVENOUS
  Filled 2014-01-10: qty 1

## 2014-01-10 MED ORDER — DEXAMETHASONE SODIUM PHOSPHATE 4 MG/ML IJ SOLN
INTRAMUSCULAR | Status: DC | PRN
  Administered 2014-01-10: 10 mg via INTRAVENOUS

## 2014-01-10 MED ORDER — EPHEDRINE SULFATE 50 MG/ML IJ SOLN
INTRAMUSCULAR | Status: DC | PRN
  Administered 2014-01-10: 5 mg via INTRAVENOUS
  Administered 2014-01-10 (×2): 10 mg via INTRAVENOUS

## 2014-01-10 MED ORDER — BELLADONNA ALKALOIDS-OPIUM 16.2-60 MG RE SUPP
RECTAL | Status: DC | PRN
  Administered 2014-01-10: 1 via RECTAL

## 2014-01-10 MED ORDER — PHENAZOPYRIDINE HCL 200 MG PO TABS
200.0000 mg | ORAL_TABLET | Freq: Three times a day (TID) | ORAL | Status: DC
  Administered 2014-01-10: 200 mg via ORAL
  Filled 2014-01-10: qty 1

## 2014-01-10 SURGICAL SUPPLY — 29 items
BAG URINE DRAINAGE (UROLOGICAL SUPPLIES) ×3 IMPLANT
BAG URO CATCHER STRL LF (DRAPE) ×3 IMPLANT
CANISTER SUCT LVC 12 LTR MEDI- (MISCELLANEOUS) ×6 IMPLANT
CATH COUDE FOLEY 2W 5CC 18FR (CATHETERS) ×3 IMPLANT
CATH FOLEY 2WAY SLVR  5CC 18FR (CATHETERS) ×2
CATH FOLEY 2WAY SLVR 5CC 18FR (CATHETERS) ×1 IMPLANT
CLOTH BEACON ORANGE TIMEOUT ST (SAFETY) ×3 IMPLANT
DRAPE CAMERA CLOSED 9X96 (DRAPES) ×3 IMPLANT
ELECT BUTTON HF 24-28F 2 30DE (ELECTRODE) IMPLANT
ELECT LOOP MED HF 24F 12D (CUTTING LOOP) IMPLANT
ELECT LOOP MED HF 24F 12D CBL (CLIP) IMPLANT
ELECT RESECT VAPORIZE 12D CBL (ELECTRODE) IMPLANT
GLOVE BIO SURGEON STRL SZ 6.5 (GLOVE) ×2 IMPLANT
GLOVE BIO SURGEON STRL SZ7.5 (GLOVE) ×3 IMPLANT
GLOVE BIO SURGEONS STRL SZ 6.5 (GLOVE) ×1
GLOVE BIOGEL PI IND STRL 6.5 (GLOVE) ×1 IMPLANT
GLOVE BIOGEL PI INDICATOR 6.5 (GLOVE) ×2
GOWN STRL REIN XL XLG (GOWN DISPOSABLE) ×3 IMPLANT
GOWN STRL REUS W/TWL XL LVL3 (GOWN DISPOSABLE) ×3 IMPLANT
HOLDER FOLEY CATH W/STRAP (MISCELLANEOUS) ×3 IMPLANT
IV NS 1000ML (IV SOLUTION) ×2
IV NS 1000ML BAXH (IV SOLUTION) ×1 IMPLANT
IV NS IRRIG 3000ML ARTHROMATIC (IV SOLUTION) ×15 IMPLANT
LASER FIBER /GREENLIGHT LASER (Laser) ×6 IMPLANT
LASER GREENLIGHT RENTAL P/PROC (Laser) ×3 IMPLANT
PACK CYSTO (CUSTOM PROCEDURE TRAY) ×3 IMPLANT
SYR 30ML LL (SYRINGE) IMPLANT
SYRINGE IRR TOOMEY STRL 70CC (SYRINGE) IMPLANT
WATER STERILE IRR 500ML POUR (IV SOLUTION) ×3 IMPLANT

## 2014-01-10 NOTE — Op Note (Signed)
Preoperative diagnosis: BPH, lower urinary tract symptoms, bladder outlet obstruction Postoperative diagnosis: Same  Procedure: Greenlight photo selective vaporization of the prostate  Surgeon: Cody Kane Asst.: Cody Kane  Indication for procedure: Cody Kane is a 58 year old male with BPH and bladder outlet obstruction. He elected to proceed with photo vaporization of the prostate in lieu of continued medical treatment.  Findings: On exam under anesthesia the penis was normal without mass or lesion. The testicles were descended bilaterally and palpably normal. On digital rectal exam the prostate was smooth without hard area or nodule. A B&O suppository was placed.  On cystoscopy there was  A normal urethra. There was trilobar hypertrophy with elevated bladder neck. The bladder mucosa appeared normal. There were no stones or foreign bodies in the bladder. The ureteral orifices were located and were J-hook from the median lobe and BPH but there was good clear reflux. There was moderate trabeculation of the bladder.  Description of procedure: After consent was obtained patient brought to the operating room. After adequate anesthesia he was placed in lithotomy position and prepped and draped in the usual sterile fashion. A timeout was performed to confirm the patient and procedure. An exam under anesthesia was performed. The laser scope was passed per urethra and the bladder carefully examined. I deployed laser fiber and after identifying the ureteral orifices made incisions at 5:00 and 7:00 down to the bladder neck. These incisions were brought down toward the veru. At the veru I made hockey-stick incisions in the apex to mark the distal extent of vaporization. From this point forward vaporization was never carried proximal to the bladder neck incisions and distal to the apical incisions. I repeatedly referred to the ureteral orifices and veru as landmarks. I then began the median lobe vaporization. The  lateral lobes were vaporized working from anterior to posterior bladder neck then the veru and then very back toward bladder neck. The residual median and lateral lobe tissue was vaporized. The bladder was drained. Hemostasis was excellent and all landmarks were intact. Further inspection revealed some residual median lobe and lateral lobe tissue which was vaporized. The initial laser fiber broke and a new fiber was used to vaporize the remaining tissue. Under low pressure there was an excellent channel and good hemostasis. Ureteral orifices were normal without injury. The bladder was refilled and the scope removed. An 44 French coud catheter was placed in the balloon filled with 13 mL. Was seated at the bladder neck and drain light pink urine. I held some gentle traction and irrigated the bladder while the patient was cleaned up and drapes removed. The irrigation remained clear. The Foley was connected to gravity drainage. The patient was awakened taken to recovery room in stable condition.  Complications: None Blood loss: Minimal Specimens: None  Drains: 48 French coud catheter  Disposition: Patient stable to PACU

## 2014-01-10 NOTE — Discharge Instructions (Signed)
Foley Catheter Care °A Foley catheter is a soft, flexible tube that is placed into the bladder to drain urine. A Foley catheter may be inserted if: °· You leak urine or are not able to control when you urinate (urinary incontinence). °· You are not able to urinate when you need to (urinary retention). °· You had prostate surgery or surgery on the genitals. °· You have certain medical conditions, such as multiple sclerosis, dementia, or a spinal cord injury. °If you are going home with a Foley catheter in place, follow the instructions below. °TAKING CARE OF THE CATHETER °1. Wash your hands with soap and water. °2. Using mild soap and warm water on a clean washcloth: °· Clean the area on your body closest to the catheter insertion site using a circular motion, moving away from the catheter. Never wipe toward the catheter because this could sweep bacteria up into the urethra and cause infection. °· Remove all traces of soap. Pat the area dry with a clean towel. For males, reposition the foreskin. °3. Attach the catheter to your leg so there is no tension on the catheter. Use adhesive tape or a leg strap. If you are using adhesive tape, remove any sticky residue left behind by the previous tape you used. °4. Keep the drainage bag below the level of the bladder, but keep it off the floor. °5. Check throughout the day to be sure the catheter is working and urine is draining freely. Make sure the tubing does not become kinked. °6. Do not pull on the catheter or try to remove it. Pulling could damage internal tissues. °TAKING CARE OF THE DRAINAGE BAGS °You will be given two drainage bags to take home. One is a large overnight drainage bag, and the other is a smaller leg bag that fits underneath clothing. You may wear the overnight bag at any time, but you should never wear the smaller leg bag at night. Follow the instructions below for how to empty, change, and clean your drainage bags. °Emptying the Drainage Bag °You must  empty your drainage bag when it is  -½ full or at least 2-3 times a day. °1. Wash your hands with soap and water. °2. Keep the drainage bag below your hips, below the level of your bladder. This stops urine from going back into the tubing and into your bladder. °3. Hold the dirty bag over the toilet or a clean container. °4. Open the pour spout at the bottom of the bag and empty the urine into the toilet or container. Do not let the pour spout touch the toilet, container, or any other surface. Doing so can place bacteria on the bag, which can cause an infection. °5. Clean the pour spout with a gauze pad or cotton ball that has rubbing alcohol on it. °6. Close the pour spout. °7. Attach the bag to your leg with adhesive tape or a leg strap. °8. Wash your hands well. °Changing the Drainage Bag °Change your drainage bag once a month or sooner if it starts to smell bad or look dirty. Below are steps to follow when changing the drainage bag. °1. Wash your hands with soap and water. °2. Pinch off the rubber catheter so that urine does not spill out. °3. Disconnect the catheter tube from the drainage tube at the connection valve. Do not let the tubes touch any surface. °4. Clean the end of the catheter tube with an alcohol wipe. Use a different alcohol wipe to clean the   end of the drainage tube. 5. Connect the catheter tube to the drainage tube of the clean drainage bag. 6. Attach the new bag to the leg with adhesive tape or a leg strap. Avoid attaching the new bag too tightly. 7. Wash your hands well. Cleaning the Drainage Bag 1. Wash your hands with soap and water. 2. Wash the bag in warm, soapy water. 3. Rinse the bag thoroughly with warm water. 4. Fill the bag with a solution of white vinegar and water (1 cup vinegar to 1 qt warm water [.2 L vinegar to 1 L warm water]). Close the bag and soak it for 30 minutes in the solution. 5. Rinse the bag with warm water. 6. Hang the bag to dry with the pour spout open  and hanging downward. 7. Store the clean bag (once it is dry) in a clean plastic bag. 8. Wash your hands well. PREVENTING INFECTION  Wash your hands before and after handling your catheter.  Take showers daily and wash the area where the catheter enters your body. Do not take baths. Replace wet leg straps with dry ones, if this applies.  Do not use powders, sprays, or lotions on the genital area. Only use creams, lotions, or ointments as directed by your caregiver.  For females, wipe from front to back after each bowel movement.  Drink enough fluids to keep your urine clear or pale yellow unless you have a fluid restriction.  Do not let the drainage bag or tubing touch or lie on the floor.  Wear cotton underwear to absorb moisture and to keep your skin drier. SEEK MEDICAL CARE IF:   Your urine is cloudy or smells unusually bad.  Your catheter becomes clogged.  You are not draining urine into the bag or your bladder feels full.  Your catheter starts to leak. SEEK IMMEDIATE MEDICAL CARE IF:   You have pain, swelling, redness, or pus where the catheter enters the body.  You have pain in the abdomen, legs, lower back, or bladder.  You have a fever.  You see blood fill the catheter, or your urine is pink or red.  You have nausea, vomiting, or chills.  Your catheter gets pulled out. MAKE SURE YOU:   Understand these instructions.  Will watch your condition.  Will get help right away if you are not doing well or get worse. Document Released: 02/17/2005 Document Revised: 07/04/2013 Document Reviewed: 02/09/2012 [facility] Patient Information 2015 [place], [place]. This information is not intended to replace advice given to you by your health care provider. Make sure you discuss any questions you have with your health care provider. Prostate Laser Surgery, Care After Refer to this sheet in the next few weeks. These instructions provide you with information on caring for yourself  after your procedure. Your health care provider may also give you more specific instructions. Your treatment has been planned according to current medical practices, but problems sometimes occur. Call your health care provider if you have any problems or questions after your procedure. WHAT TO EXPECT AFTER THE PROCEDURE  You may notice blood in your urine that could last up to 3 weeks.  After your catheter is removed, you will have burning (especially at the tip of your penis) when you urinate, especially at the end of urination. For the first few weeks after your procedure, you will feel the need to urinate often. HOME CARE INSTRUCTIONS  7. Do not perform vigorous exercise, especially heavy lifting, for 1 week or as  directed by your health care provider. °8. Avoid sexual activity for 4-6 weeks or as directed by your health care provider. °9. Do not ride in a car for extended periods for 1 month or as directed by your health care provider. °10. Do not strain to have a bowel movement. Drink a lot of fluids and and make sure you get enough fiber in your diet. °11. Drink enough fluids to keep your urine clear or pale yellow. °SEEK IMMEDIATE MEDICAL CARE IF:  °9. Your catheter has been removed and you are suddenly unable to urinate. °10. Your catheter has not been removed, and it develops a blockage. °11. You start to have blood clots in your urine. °12. The blood in your urine becomes persistent or gets thick. °13. Your temperature is greater than 100.5°F (38.1°C). °14. You develop chest pains. °15. You develop shortness of breath. °16. You develop leg swelling or pain. °MAKE SURE YOU: °8. Understand these instructions. °9. Will watch your condition. °10. Will get help right away if you are not doing well or get worse. °Document Released: 02/17/2005 Document Revised: 02/22/2013 Document Reviewed: 08/09/2012 °[employer]® Patient Information ©2015 [employer]. This information is not intended to replace advice  given to you by your health care provider. Make sure you discuss any questions you have with your health care provider. ° °Post Anesthesia Home Care Instructions ° °Activity: °Get plenty of rest for the remainder of the day. A responsible adult should stay with you for 24 hours following the procedure.  °For the next 24 hours, DO NOT: °-Drive a car °-Operate machinery °-Drink alcoholic beverages °-Take any medication unless instructed by your physician °-Make any legal decisions or sign important papers. ° °Meals: °Start with liquid foods such as gelatin or soup. Progress to regular foods as tolerated. Avoid greasy, spicy, heavy foods. If nausea and/or vomiting occur, drink only clear liquids until the nausea and/or vomiting subsides. Call your physician if vomiting continues. ° °Special Instructions/Symptoms: °Your throat may feel dry or sore from the anesthesia or the breathing tube placed in your throat during surgery. If this causes discomfort, gargle with warm salt water. The discomfort should disappear within 24 hours. ° °

## 2014-01-10 NOTE — Transfer of Care (Signed)
Immediate Anesthesia Transfer of Care Note  Patient: Cody Kane  Procedure(s) Performed: Procedure(s): GREEN LIGHT LASER TURP (TRANSURETHRAL RESECTION OF PROSTATE (N/A)  Patient Location: PACU  Anesthesia Type:General  Level of Consciousness: awake, alert , oriented and patient cooperative  Airway & Oxygen Therapy: Patient Spontanous Breathing and Patient connected to nasal cannula oxygen  Post-op Assessment: Report given to PACU RN and Post -op Vital signs reviewed and stable  Post vital signs: Reviewed and stable  Complications: No apparent anesthesia complications

## 2014-01-10 NOTE — Anesthesia Procedure Notes (Signed)
Procedure Name: LMA Insertion Date/Time: 01/10/2014 8:33 AM Performed by: Wanita Chamberlain Pre-anesthesia Checklist: Patient identified, Emergency Drugs available, Suction available, Patient being monitored and Timeout performed Patient Re-evaluated:Patient Re-evaluated prior to inductionOxygen Delivery Method: Circle system utilized Preoxygenation: Pre-oxygenation with 100% oxygen Intubation Type: IV induction Ventilation: Mask ventilation without difficulty LMA: LMA inserted LMA Size: 4.0 Number of attempts: 1 Placement Confirmation: positive ETCO2 Tube secured with: Tape Dental Injury: Teeth and Oropharynx as per pre-operative assessment

## 2014-01-10 NOTE — Anesthesia Postprocedure Evaluation (Signed)
  Anesthesia Post-op Note  Patient: Cody Kane  Procedure(s) Performed: Procedure(s) (LRB): GREEN LIGHT LASER TURP (TRANSURETHRAL RESECTION OF PROSTATE (N/A)  Patient Location: PACU  Anesthesia Type: General  Level of Consciousness: awake and alert   Airway and Oxygen Therapy: Patient Spontanous Breathing  Post-op Pain: mild  Post-op Assessment: Post-op Vital signs reviewed, Patient's Cardiovascular Status Stable, Respiratory Function Stable, Patent Airway and No signs of Nausea or vomiting  Last Vitals:  Filed Vitals:   01/10/14 1003  BP: 139/68  Pulse: 87  Temp: 35.8 C  Resp: 15    Post-op Vital Signs: stable   Complications: No apparent anesthesia complications

## 2014-01-11 ENCOUNTER — Encounter (HOSPITAL_BASED_OUTPATIENT_CLINIC_OR_DEPARTMENT_OTHER): Payer: Self-pay | Admitting: Urology

## 2014-02-09 ENCOUNTER — Encounter: Payer: Self-pay | Admitting: Sports Medicine

## 2014-02-09 ENCOUNTER — Ambulatory Visit (INDEPENDENT_AMBULATORY_CARE_PROVIDER_SITE_OTHER): Payer: 59 | Admitting: Sports Medicine

## 2014-02-09 ENCOUNTER — Telehealth: Payer: Self-pay

## 2014-02-09 VITALS — BP 127/83 | HR 76 | Ht 70.0 in | Wt 200.0 lb

## 2014-02-09 DIAGNOSIS — M159 Polyosteoarthritis, unspecified: Secondary | ICD-10-CM

## 2014-02-09 DIAGNOSIS — G47419 Narcolepsy without cataplexy: Secondary | ICD-10-CM

## 2014-02-09 MED ORDER — MODAFINIL 100 MG PO TABS: 100.0000 mg | ORAL_TABLET | Freq: Every day | ORAL | Status: DC

## 2014-02-09 MED ORDER — ARMODAFINIL 50 MG PO TABS: 100.0000 mg | ORAL_TABLET | Freq: Every day | ORAL | Status: DC

## 2014-02-09 NOTE — Assessment & Plan Note (Signed)
Today we performed injections of the right second, third, and fourth and fifth proximal interphalangeal joints and the left second and third proximal interphalangeal joints. Return as needed.

## 2014-02-09 NOTE — Progress Notes (Signed)
Subjective:    CC: Hand pain  HPI: This is an extremely pleasant 58 year old male, he has bilateral hand osteoarthritis with pain generators predominately at the proximal interphalangeal joints. Over a 6 months ago we injected his PIP joints, and he had a fantastic response. He is now having a recurrence of pain, moderate, persistent, in his second through fifth proximal interphalangeal joints on the right hand, and fourth and fifth proximal interphalangeal joints on the left hand.  Past medical history, Surgical history, Family history not pertinant except as noted below, Social history, Allergies, and medications have been entered into the medical record, reviewed, and no changes needed.   Review of Systems: No fevers, chills, night sweats, weight loss, chest pain, or shortness of breath.   Objective:    General: Well Developed, well nourished, and in no acute distress.  Neuro: Alert and oriented x3, extra-ocular muscles intact, sensation grossly intact.  HEENT: Normocephalic, atraumatic, pupils equal round reactive to light, neck supple, no masses, no lymphadenopathy, thyroid nonpalpable.  Skin: Warm and dry, no rashes. Cardiac: Regular rate and rhythm, no murmurs rubs or gallops, no lower extremity edema.  Respiratory: Clear to auscultation bilaterally. Not using accessory muscles, speaking in full sentences.  Procedure: Real-time Ultrasound Guided Injection of left fourth proximal interphalangeal joint Device: GE Logiq E  Verbal informed consent obtained.  Time-out conducted.  Noted no overlying erythema, induration, or other signs of local infection.  Skin prepped in a sterile fashion.  Local anesthesia: Topical Ethyl chloride.  With sterile technique and under real time ultrasound guidance:  0.5 cc Kenalog 40, 0.5 cc lidocaine injected easily. Completed without difficulty  Pain immediately resolved suggesting accurate placement of the medication.  Advised to call if  fevers/chills, erythema, induration, drainage, or persistent bleeding.  Images permanently stored and available for review in the ultrasound unit.  Impression: Technically successful ultrasound guided injection.  Procedure: Real-time Ultrasound Guided Injection of left fifth proximal interphalangeal joint Device: GE Logiq E  Verbal informed consent obtained.  Time-out conducted.  Noted no overlying erythema, induration, or other signs of local infection.  Skin prepped in a sterile fashion.  Local anesthesia: Topical Ethyl chloride.  With sterile technique and under real time ultrasound guidance:  0.5 cc Kenalog 40, 0.5 cc lidocaine injected easily. Completed without difficulty  Pain immediately resolved suggesting accurate placement of the medication.  Advised to call if fevers/chills, erythema, induration, drainage, or persistent bleeding.  Images permanently stored and available for review in the ultrasound unit.  Impression: Technically successful ultrasound guided injection.  Procedure: Real-time Ultrasound Guided Injection of right second proximal interphalangeal joint Device: GE Logiq E  Verbal informed consent obtained.  Time-out conducted.  Noted no overlying erythema, induration, or other signs of local infection.  Skin prepped in a sterile fashion.  Local anesthesia: Topical Ethyl chloride.  With sterile technique and under real time ultrasound guidance:  0.5 cc Kenalog 40, 0.5 cc lidocaine injected easily. Completed without difficulty  Pain immediately resolved suggesting accurate placement of the medication.  Advised to call if fevers/chills, erythema, induration, drainage, or persistent bleeding.  Images permanently stored and available for review in the ultrasound unit.  Impression: Technically successful ultrasound guided injection.  Procedure: Real-time Ultrasound Guided Injection of right third proximal interphalangeal joint Device: GE Logiq E  Verbal informed  consent obtained.  Time-out conducted.  Noted no overlying erythema, induration, or other signs of local infection.  Skin prepped in a sterile fashion.  Local anesthesia: Topical Ethyl chloride.  With sterile technique and under real time ultrasound guidance:  0.5 cc Kenalog 40, 0.5 cc lidocaine injected easily. Completed without difficulty  Pain immediately resolved suggesting accurate placement of the medication.  Advised to call if fevers/chills, erythema, induration, drainage, or persistent bleeding.  Images permanently stored and available for review in the ultrasound unit.  Impression: Technically successful ultrasound guided injection.  Procedure: Real-time Ultrasound Guided Injection of right fourth proximal interphalangeal joint Device: GE Logiq E  Verbal informed consent obtained.  Time-out conducted.  Noted no overlying erythema, induration, or other signs of local infection.  Skin prepped in a sterile fashion.  Local anesthesia: Topical Ethyl chloride.  With sterile technique and under real time ultrasound guidance:  0.5 cc Kenalog 40, 0.5 cc lidocaine injected easily. Completed without difficulty  Pain immediately resolved suggesting accurate placement of the medication.  Advised to call if fevers/chills, erythema, induration, drainage, or persistent bleeding.  Images permanently stored and available for review in the ultrasound unit.  Impression: Technically successful ultrasound guided injection.  Procedure: Real-time Ultrasound Guided Injection of right fifth proximal interphalangeal joint Device: GE Logiq E  Verbal informed consent obtained.  Time-out conducted.  Noted no overlying erythema, induration, or other signs of local infection.  Skin prepped in a sterile fashion.  Local anesthesia: Topical Ethyl chloride.  With sterile technique and under real time ultrasound guidance:  0.5 cc Kenalog 40, 0.5 cc lidocaine injected easily. Completed without difficulty    Pain immediately resolved suggesting accurate placement of the medication.  Advised to call if fevers/chills, erythema, induration, drainage, or persistent bleeding.  Images permanently stored and available for review in the ultrasound unit.  Impression: Technically successful ultrasound guided injection.  Impression and Recommendations:

## 2014-02-09 NOTE — Telephone Encounter (Signed)
Rx in box. 

## 2014-02-09 NOTE — Telephone Encounter (Signed)
Rx has been faxed to [facility]. Rhonda Cunningham,CMA

## 2014-02-09 NOTE — Telephone Encounter (Signed)
Patient called stated that his insurance would not cove the provigil but they would [employer]. Patient is requesting a new Rx sent to [facility]. Nalany Steedley,CMA

## 2014-02-09 NOTE — Assessment & Plan Note (Signed)
Sleep apnea is now controlled with CPAP or daily. He no longer wakes up to urinate, he has had his prostate procedure. We are going to start a trial of Provigil. Return in one month.

## 2014-03-09 ENCOUNTER — Ambulatory Visit (INDEPENDENT_AMBULATORY_CARE_PROVIDER_SITE_OTHER): Payer: 59 | Admitting: Sports Medicine

## 2014-03-09 ENCOUNTER — Encounter: Payer: Self-pay | Admitting: Sports Medicine

## 2014-03-09 VITALS — BP 119/79 | HR 73 | Ht 70.0 in | Wt 190.0 lb

## 2014-03-09 DIAGNOSIS — M159 Polyosteoarthritis, unspecified: Secondary | ICD-10-CM

## 2014-03-09 DIAGNOSIS — G4700 Insomnia, unspecified: Secondary | ICD-10-CM

## 2014-03-09 DIAGNOSIS — G4733 Obstructive sleep apnea (adult) (pediatric): Secondary | ICD-10-CM

## 2014-03-09 MED ORDER — ACETAMINOPHEN-CODEINE 300-60 MG PO TABS: 1.0000 | ORAL_TABLET | Freq: Every day | ORAL | Status: DC | PRN

## 2014-03-09 MED ORDER — TRAZODONE HCL 50 MG PO TABS: 50.0000 mg | ORAL_TABLET | Freq: Every day | ORAL | Status: DC

## 2014-03-09 NOTE — Assessment & Plan Note (Signed)
Short-term prescription of Tylenol No. 4.

## 2014-03-09 NOTE — Assessment & Plan Note (Signed)
With daytime fatigue, sleep apnea is well controlled per CPAP download. We tried individual without any improvement. On further questioning he does endorse some mild depressive symptoms that may represent dysthymia. Present symptoms is impaired quality of sleep. We are going to start trazodone low-dose, he will go to 100 mg after 2 weeks if insufficient effect. Return to see me in one month, which always consider Ambien if insufficient response.

## 2014-03-09 NOTE — Progress Notes (Signed)
  Subjective:    CC: Follow-up  HPI: Excessive daytime sleepiness: Currently on CPAP, he has been 100% compliant on a recent report from 2 days ago. Initially he denied any depressive symptoms. We started Nuvigil at the last visit which she tells me since somewhat jittery but does not improve his fatigue. On further questioning he does endorse some depressive symptoms without any suicidal or homicidal ideation. He also tells me that he does not awaken well rested. Symptoms are moderate, persistent.  Past medical history, Surgical history, Family history not pertinant except as noted below, Social history, Allergies, and medications have been entered into the medical record, reviewed, and no changes needed.   Review of Systems: No fevers, chills, night sweats, weight loss, chest pain, or shortness of breath.   Objective:    General: Well Developed, well nourished, and in no acute distress.  Neuro: Alert and oriented x3, extra-ocular muscles intact, sensation grossly intact.  HEENT: Normocephalic, atraumatic, pupils equal round reactive to light, neck supple, no masses, no lymphadenopathy, thyroid nonpalpable.  Skin: Warm and dry, no rashes. Cardiac: Regular rate and rhythm, no murmurs rubs or gallops, no lower extremity edema.  Respiratory: Clear to auscultation bilaterally. Not using accessory muscles, speaking in full sentences.  Impression and Recommendations:

## 2014-03-22 ENCOUNTER — Encounter (HOSPITAL_BASED_OUTPATIENT_CLINIC_OR_DEPARTMENT_OTHER): Payer: Self-pay | Admitting: Urology

## 2014-04-10 ENCOUNTER — Encounter: Payer: Self-pay | Admitting: Sports Medicine

## 2014-04-10 ENCOUNTER — Ambulatory Visit (INDEPENDENT_AMBULATORY_CARE_PROVIDER_SITE_OTHER): Payer: 59 | Admitting: Sports Medicine

## 2014-04-10 VITALS — BP 113/72 | HR 69 | Ht 70.0 in | Wt 188.0 lb

## 2014-04-10 DIAGNOSIS — M159 Polyosteoarthritis, unspecified: Secondary | ICD-10-CM

## 2014-04-10 DIAGNOSIS — G4700 Insomnia, unspecified: Secondary | ICD-10-CM

## 2014-04-10 DIAGNOSIS — G4733 Obstructive sleep apnea (adult) (pediatric): Secondary | ICD-10-CM

## 2014-04-10 MED ORDER — ACETAMINOPHEN-CODEINE 300-60 MG PO TABS: 1.0000 | ORAL_TABLET | Freq: Every day | ORAL | Status: DC | PRN

## 2014-04-10 NOTE — Progress Notes (Signed)
  Subjective:    CC: Follow-up  HPI: Hand osteoarthritis: Doing extremely well after injections. Also doing well with occasional Tylenol No. 4 at bedtime.  Obstructive uropathy: No longer using Flomax.  Insomnia: Did not do well with trazodone, again, Tylenol No. 4 seems to work extremely well, he uses only 30 per month.  Past medical history, Surgical history, Family history not pertinant except as noted below, Social history, Allergies, and medications have been entered into the medical record, reviewed, and no changes needed.   Review of Systems: No fevers, chills, night sweats, weight loss, chest pain, or shortness of breath.   Objective:    General: Well Developed, well nourished, and in no acute distress.  Neuro: Alert and oriented x3, extra-ocular muscles intact, sensation grossly intact.  HEENT: Normocephalic, atraumatic, pupils equal round reactive to light, neck supple, no masses, no lymphadenopathy, thyroid nonpalpable.  Skin: Warm and dry, no rashes. Cardiac: Regular rate and rhythm, no murmurs rubs or gallops, no lower extremity edema.  Respiratory: Clear to auscultation bilaterally. Not using accessory muscles, speaking in full sentences.  Impression and Recommendations:

## 2014-04-10 NOTE — Assessment & Plan Note (Signed)
Excessive drowsiness with trazodone. Tylenol No. 4 seems to work well, I am going to refill this, #30 per month, 3 month supply given.

## 2014-04-10 NOTE — Assessment & Plan Note (Signed)
We are going to do a trial off of CPAP for a week, if symptoms worsen certainly we need to really start, he typically has 100% compliance.

## 2014-04-20 ENCOUNTER — Encounter: Payer: Self-pay | Admitting: Sports Medicine

## 2014-05-19 ENCOUNTER — Emergency Department (HOSPITAL_BASED_OUTPATIENT_CLINIC_OR_DEPARTMENT_OTHER)
Admission: EM | Admit: 2014-05-19 | Discharge: 2014-05-19 | Disposition: A | Discharge status: Home / Self Care | Payer: 59 | Attending: Emergency Medicine | Admitting: Emergency Medicine

## 2014-05-19 ENCOUNTER — Encounter (HOSPITAL_BASED_OUTPATIENT_CLINIC_OR_DEPARTMENT_OTHER): Payer: Self-pay

## 2014-05-19 DIAGNOSIS — Z720 Tobacco use: Secondary | ICD-10-CM | POA: Diagnosis not present

## 2014-05-19 DIAGNOSIS — W408XXA Explosion of other specified explosive materials, initial encounter: Secondary | ICD-10-CM | POA: Diagnosis not present

## 2014-05-19 DIAGNOSIS — Y9289 Other specified places as the place of occurrence of the external cause: Secondary | ICD-10-CM | POA: Diagnosis not present

## 2014-05-19 DIAGNOSIS — T24212A Burn of second degree of left thigh, initial encounter: Secondary | ICD-10-CM | POA: Diagnosis not present

## 2014-05-19 DIAGNOSIS — Z8669 Personal history of other diseases of the nervous system and sense organs: Secondary | ICD-10-CM | POA: Diagnosis not present

## 2014-05-19 DIAGNOSIS — T24011A Burn of unspecified degree of right thigh, initial encounter: Secondary | ICD-10-CM | POA: Diagnosis present

## 2014-05-19 DIAGNOSIS — M1990 Unspecified osteoarthritis, unspecified site: Secondary | ICD-10-CM | POA: Diagnosis not present

## 2014-05-19 DIAGNOSIS — Z23 Encounter for immunization: Secondary | ICD-10-CM | POA: Insufficient documentation

## 2014-05-19 DIAGNOSIS — Y9389 Activity, other specified: Secondary | ICD-10-CM | POA: Insufficient documentation

## 2014-05-19 DIAGNOSIS — K219 Gastro-esophageal reflux disease without esophagitis: Secondary | ICD-10-CM | POA: Diagnosis not present

## 2014-05-19 DIAGNOSIS — Y998 Other external cause status: Secondary | ICD-10-CM | POA: Insufficient documentation

## 2014-05-19 DIAGNOSIS — T24301A Burn of third degree of unspecified site of right lower limb, except ankle and foot, initial encounter: Secondary | ICD-10-CM

## 2014-05-19 DIAGNOSIS — T24291A Burn of second degree of multiple sites of right lower limb, except ankle and foot, initial encounter: Secondary | ICD-10-CM

## 2014-05-19 DIAGNOSIS — T24312A Burn of third degree of left thigh, initial encounter: Secondary | ICD-10-CM | POA: Diagnosis not present

## 2014-05-19 DIAGNOSIS — Z87448 Personal history of other diseases of urinary system: Secondary | ICD-10-CM | POA: Diagnosis not present

## 2014-05-19 MED ORDER — SILVER SULFADIAZINE 1 % EX CREA
TOPICAL_CREAM | Freq: Once | CUTANEOUS | Status: AC
  Administered 2014-05-19: 14:00:00 via TOPICAL
  Filled 2014-05-19: qty 85

## 2014-05-19 MED ORDER — TETANUS-DIPHTH-ACELL PERTUSSIS 5-2.5-18.5 LF-MCG/0.5 IM SUSP
0.5000 mL | Freq: Once | INTRAMUSCULAR | Status: AC
  Administered 2014-05-19: 0.5 mL via INTRAMUSCULAR
  Filled 2014-05-19: qty 0.5

## 2014-05-19 MED ORDER — OXYCODONE-ACETAMINOPHEN 10-325 MG PO TABS: 0.5000 | ORAL_TABLET | ORAL | Status: DC | PRN

## 2014-05-19 MED ORDER — HYDROMORPHONE HCL 1 MG/ML IJ SOLN
1.0000 mg | INTRAMUSCULAR | Status: DC
  Administered 2014-05-19 (×2): 1 mg via INTRAVENOUS
  Filled 2014-05-19: qty 1

## 2014-05-19 MED ORDER — ONDANSETRON HCL 4 MG PO TABS: 4.0000 mg | ORAL_TABLET | Freq: Three times a day (TID) | ORAL | Status: DC | PRN

## 2014-05-19 MED ORDER — HYDROMORPHONE HCL 1 MG/ML IJ SOLN
1.0000 mg | Freq: Once | INTRAMUSCULAR | Status: DC
  Filled 2014-05-19: qty 1

## 2014-05-19 MED ORDER — SILVER SULFADIAZINE 1 % EX CREA: 1.0000 "application " | TOPICAL_CREAM | Freq: Every day | CUTANEOUS | Status: DC

## 2014-05-19 MED ORDER — MORPHINE SULFATE 4 MG/ML IJ SOLN
4.0000 mg | Freq: Once | INTRAMUSCULAR | Status: AC
  Administered 2014-05-19: 4 mg via INTRAVENOUS
  Filled 2014-05-19: qty 1

## 2014-05-19 MED ORDER — ONDANSETRON HCL 4 MG/2ML IJ SOLN
4.0000 mg | Freq: Once | INTRAMUSCULAR | Status: AC
  Administered 2014-05-19: 4 mg via INTRAVENOUS
  Filled 2014-05-19: qty 2

## 2014-05-19 NOTE — ED Notes (Signed)
Applied silvadene cream along with nonstick dressing and wrapped in cling gauze wrap.  Teaching done
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
## 2014-05-22 DIAGNOSIS — L299 Pruritus, unspecified: Secondary | ICD-10-CM | POA: Insufficient documentation

## 2014-05-22 DIAGNOSIS — T24209A Burn of second degree of unspecified site of unspecified lower limb, except ankle and foot, initial encounter: Secondary | ICD-10-CM | POA: Insufficient documentation

## 2014-05-22 DIAGNOSIS — G8911 Acute pain due to trauma: Secondary | ICD-10-CM | POA: Insufficient documentation

## 2014-05-22 DIAGNOSIS — T310 Burns involving less than 10% of body surface: Secondary | ICD-10-CM | POA: Insufficient documentation

## 2014-05-26 ENCOUNTER — Encounter: Payer: Self-pay | Admitting: Emergency Medicine

## 2014-05-26 ENCOUNTER — Emergency Department
Admission: EM | Admit: 2014-05-26 | Discharge: 2014-05-26 | Disposition: A | Discharge status: Home / Self Care | Payer: 59 | Source: Home / Self Care | Attending: Emergency Medicine | Admitting: Emergency Medicine

## 2014-05-26 DIAGNOSIS — J029 Acute pharyngitis, unspecified: Secondary | ICD-10-CM

## 2014-05-26 LAB — POCT RAPID STREP A (OFFICE): Rapid Strep A Screen: NEGATIVE

## 2014-05-26 MED ORDER — AMOXICILLIN 875 MG PO TABS: ORAL_TABLET | ORAL | Status: DC

## 2014-05-26 NOTE — ED Notes (Signed)
Pt c/o sore throat and fever x2 days.
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
## 2014-05-28 LAB — STREP A DNA PROBE: GASP: NEGATIVE

## 2014-05-29 ENCOUNTER — Telehealth: Payer: Self-pay | Admitting: *Deleted

## 2014-06-26 ENCOUNTER — Encounter: Payer: Self-pay | Admitting: Sports Medicine

## 2014-06-26 ENCOUNTER — Ambulatory Visit (INDEPENDENT_AMBULATORY_CARE_PROVIDER_SITE_OTHER): Payer: 59 | Admitting: Sports Medicine

## 2014-06-26 VITALS — BP 127/78 | HR 82 | Temp 98.4°F | Resp 18 | Ht 70.0 in | Wt 190.0 lb

## 2014-06-26 DIAGNOSIS — G4733 Obstructive sleep apnea (adult) (pediatric): Secondary | ICD-10-CM | POA: Diagnosis not present

## 2014-06-26 DIAGNOSIS — M159 Polyosteoarthritis, unspecified: Secondary | ICD-10-CM

## 2014-06-26 DIAGNOSIS — G4700 Insomnia, unspecified: Secondary | ICD-10-CM | POA: Diagnosis not present

## 2014-06-26 MED ORDER — SUVOREXANT 10 MG PO TABS: 1.0000 | ORAL_TABLET | Freq: Every day | ORAL | Status: DC

## 2014-06-26 MED ORDER — ACETAMINOPHEN ER 650 MG PO TBCR: 1300.0000 mg | EXTENDED_RELEASE_TABLET | Freq: Three times a day (TID) | ORAL | Status: DC | PRN

## 2014-06-26 NOTE — Assessment & Plan Note (Signed)
We have since discontinued CPAP, it has neither helped nor hurt.

## 2014-06-26 NOTE — Progress Notes (Signed)
  Subjective:    CC: Follow-up  HPI: Insomnia: Has stopped all medications, he has failed Ambien, trazodone, sleep hygiene, oral antihistamines, oral narcotics.  Sleep apnea: Discontinued CPAP, feels about the same with regards to his sleeping as well as wakefulness during the day.  Hand osteoarthritis: Doing well with arthritis strength Tylenol  Past medical history, Surgical history, Family history not pertinant except as noted below, Social history, Allergies, and medications have been entered into the medical record, reviewed, and no changes needed.   Review of Systems: No fevers, chills, night sweats, weight loss, chest pain, or shortness of breath.   Objective:    General: Well Developed, well nourished, and in no acute distress.  Neuro: Alert and oriented x3, extra-ocular muscles intact, sensation grossly intact.  HEENT: Normocephalic, atraumatic, pupils equal round reactive to light, neck supple, no masses, no lymphadenopathy, thyroid nonpalpable.  Skin: Warm and dry, no rashes. Cardiac: Regular rate and rhythm, no murmurs rubs or gallops, no lower extremity edema.  Respiratory: Clear to auscultation bilaterally. Not using accessory muscles, speaking in full sentences.  Impression and Recommendations:

## 2014-06-26 NOTE — Assessment & Plan Note (Signed)
Failed trazodone, has had insufficient response is with Ambien and the other medications in this class. We are going to try Belsomra.

## 2014-06-26 NOTE — Assessment & Plan Note (Signed)
Continue Tylenol arthritis strength daily.

## 2014-07-24 ENCOUNTER — Ambulatory Visit (INDEPENDENT_AMBULATORY_CARE_PROVIDER_SITE_OTHER): Payer: 59 | Admitting: Sports Medicine

## 2014-07-24 ENCOUNTER — Encounter: Payer: Self-pay | Admitting: Sports Medicine

## 2014-07-24 DIAGNOSIS — G4700 Insomnia, unspecified: Secondary | ICD-10-CM | POA: Diagnosis not present

## 2014-07-24 NOTE — Progress Notes (Signed)
  Subjective:    CC: Insomnia  HPI: Cody Kane returns, we tried everything for his insomnia, he did have a positive sleep study with obstructive sleep apnea but after several months did not note any improvement with CPAP, we were able to download the data and he had good compliance, as well as good control of his objective obstructive symptoms. Subsequently we tried stimulant medicines during the day which were effective, then we tried hypnotic medications at night including Ambien, trazodone, antihistamines, as well as Belsomra, unfortunately despite everything he continues to have daytime drowsiness.  Past medical history, Surgical history, Family history not pertinant except as noted below, Social history, Allergies, and medications have been entered into the medical record, reviewed, and no changes needed.   Review of Systems: No fevers, chills, night sweats, weight loss, chest pain, or shortness of breath.   Objective:    General: Well Developed, well nourished, and in no acute distress.  Neuro: Alert and oriented x3, extra-ocular muscles intact, sensation grossly intact.  HEENT: Normocephalic, atraumatic, pupils equal round reactive to light, neck supple, no masses, no lymphadenopathy, thyroid nonpalpable.  Skin: Warm and dry, no rashes. Cardiac: Regular rate and rhythm, no murmurs rubs or gallops, no lower extremity edema.  Respiratory: Clear to auscultation bilaterally. Not using accessory muscles, speaking in full sentences.  Impression and Recommendations:

## 2014-07-24 NOTE — Assessment & Plan Note (Signed)
Fortunately we have had great difficulty improving Cody Kane's sleep. We tried multiple medications include trazodone, Ambien, Belsomra. We tried keeping him awake during the day which has been ineffective. He does have clinical stroke of sleep apnea but noted no improvement on CPAP, CPAP download showed good compliance and good control of his apneic symptoms. At this point I do think we need to enlist the help of a sleep specialist neurologist. Referral as below, return as needed.

## 2014-08-21 ENCOUNTER — Encounter: Payer: Self-pay | Admitting: Neurology

## 2014-08-21 ENCOUNTER — Other Ambulatory Visit: Payer: Self-pay | Admitting: Sports Medicine

## 2014-08-21 ENCOUNTER — Ambulatory Visit (INDEPENDENT_AMBULATORY_CARE_PROVIDER_SITE_OTHER): Payer: 59 | Admitting: Neurology

## 2014-08-21 VITALS — BP 129/85 | HR 75 | Resp 16 | Ht 70.0 in | Wt 195.0 lb

## 2014-08-21 DIAGNOSIS — G4733 Obstructive sleep apnea (adult) (pediatric): Secondary | ICD-10-CM | POA: Diagnosis not present

## 2014-08-21 DIAGNOSIS — G2581 Restless legs syndrome: Secondary | ICD-10-CM | POA: Diagnosis not present

## 2014-08-21 DIAGNOSIS — G4761 Periodic limb movement disorder: Secondary | ICD-10-CM

## 2014-08-21 DIAGNOSIS — R351 Nocturia: Secondary | ICD-10-CM

## 2014-08-21 DIAGNOSIS — R51 Headache: Secondary | ICD-10-CM | POA: Diagnosis not present

## 2014-08-21 DIAGNOSIS — R519 Headache, unspecified: Secondary | ICD-10-CM

## 2014-08-21 DIAGNOSIS — G478 Other sleep disorders: Secondary | ICD-10-CM

## 2014-08-21 MED ORDER — VALACYCLOVIR HCL 1 G PO TABS: 1000.0000 mg | ORAL_TABLET | Freq: Two times a day (BID) | ORAL | Status: DC | PRN

## 2014-08-21 NOTE — Patient Instructions (Signed)

## 2014-08-21 NOTE — Progress Notes (Signed)
Subjective:    Patient ID: Cody Kane is a 59 y.o. male.  HPI     Cody Age, MD, PhD [facility] [address] [place], [place] [address]  Dear Dr. Dianah Field,   I saw your patient, Cody Kane, upon your kind request in my neurologic clinic today for initial consultation of his sleep disorder, in particular his prior diagnosis of obstructive sleep apnea and sleep initiation and sleep maintenance difficulties. The patient is unaccompanied today. As you know, Cody Kane is a 59 year old right-handed gentleman with an underlying medical history of reflux disease, arthritis, BPH, and overweight state, who was diagnosed with obstructive sleep apnea last year. He was on PAP therapy for a few months, starting in November 2015, but discontinued it. Prior sleep test results are not available for my review. He reports excessive daytime somnolence, restless sleep, vivid dreams, sleep talking, leg twitching at night, morning headaches which can be severe, and nocturia. He had a home sleep test in October 2015. He states he tried AutoPap for about 3 months. It did not help. At the time of his home sleep test he also had a recent flulike illness and had congestion, he also had prostate problems and since then in November 2015 had laser prostate surgery. This improved his nocturia but he still wakes up about once around 2 AM to go to the bathroom. Following the sleep is not a big problem for him currently. He could not tolerate most sleep aids and also tried Nuvigil but he also did not tolerate very well either. His father snores very loudly and was very sleepy. He had never been tested with a sleep study. His wife does not sleep in the same bed with him because of his restless sleep and at times loud snoring. He would be willing to be retested with a proper sleep study and get to the bottom of his sleep disorder. He tries to go to bed around 9 PM each  night. He falls asleep usually within 15-60 minutes. Staying asleep is a problem for him at times. His rise time is 5 AM and he typically does not wake up rested. He is sleepy during the day. He drinks about 2 cups of coffee in the morning and 1 cola at lunch, rarely also at dinner. He drinks alcohol very rarely, maybe twice a year. He quit smoking cigarettes about 5-6 years ago. His Epworth sleepiness score is 15 out of 24 today, his fatigue scores 43 out of 63. He endorses restless leg symptoms. He wakes up with a headache about 5 times per month currently. His morning headaches became worse after he tried Belsomra, which he took about a month ago for about 3 nights. For sleep he has been tried on Ambien, trazodone, Belsomra, and he also tried [place] during the day.  Sometimes when he falls asleep during the day, he has dreamlike sequences.  He usually watches TV in bed and falls asleep with the TV on. He turns it off when he wakes up in the middle of the night.  He works daytime hours. 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   His Past Medical History Is Significant For: Past Medical History  Diagnosis Date  . GERD (gastroesophageal reflux disease)   . Arthritis   . Elevated PSA   . BPH (benign prostatic hypertrophy)   . Lower urinary tract symptoms (LUTS)   . Mild obstructive sleep apnea     pt just had study done--  to start using cpap next week    His Past Surgical History Is Significant For: Past Surgical History  Procedure Laterality Date  . Colonoscopy  2011  . Cystoscopy N/A 12/02/2013    Procedure: CYSTOSCOPY;  Surgeon: Festus Aloe, MD;  Location: [facility];  Service: Urology;  Laterality: N/A;  . Prostate biopsy N/A 12/02/2013    Procedure: BIOPSY TRANSRECTAL ULTRASONIC PROSTATE (TUBP);  Surgeon: Festus Aloe, MD;  Location: [facility];  Service: Urology;  Laterality: N/A;  . Green light laser turp (transurethral resection of prostate N/A 01/10/2014     Procedure: GREEN LIGHT LASER TURP (TRANSURETHRAL RESECTION OF PROSTATE;  Surgeon: Festus Aloe, MD;  Location: [facility];  Service: Urology;  Laterality: N/A;    His Family History Is Significant For: Family History  Problem Relation Kane of Onset  . Cancer Mother     colon  . Cancer Father     lung    His Social History Is Significant For: History   Social History  . Marital Status: Married    Spouse Name: [name]  . Number of Children: 1  . Years of Education: College   Occupational History  .      Administration    Social History Main Topics  . Smoking status: Former Smoker -- 1.00 packs/day for 25 years    Types: Cigarettes    Quit date: 05/01/2009  . Smokeless tobacco: Never Used     Comment: Pt uses "vape" rare occasion   . Alcohol Use: 0.0 oz/week    0 Standard drinks or equivalent per week     Comment: occasional  . Drug Use: No  . Sexual Activity: Not on file   Other Topics Concern  . None   Social History Narrative   Drinks about 2 cups of coffee a day     His Allergies Are:  No Known Allergies:   His Current Medications Are:  Outpatient Encounter Prescriptions as of 08/21/2014  Medication Sig  . acetaminophen (TYLENOL) 650 MG CR tablet Take 2 tablets (1,300 mg total) by mouth every 8 (eight) hours as needed for pain.  Marland Kitchen omeprazole (PRILOSEC) 40 MG capsule Take 40 mg by mouth every morning.  . valACYclovir (VALTREX) 500 MG tablet Take 500 mg by mouth as needed.  . [DISCONTINUED] AMBULATORY NON FORMULARY MEDICATION Auto-CPAP machine and supplies. Pressure 4-20cm H2O.  Dx:  G47.33, OSA.   No facility-administered encounter medications on file as of 08/21/2014.  :  Review of Systems:  Out of a complete 14 point review of systems, all are reviewed and negative with the exception of these symptoms as listed below:   Review of Systems  HENT: Positive for hearing loss and tinnitus.   Musculoskeletal:       Joint pain    Neurological: Positive for dizziness.       Memory loss, confusion, Trouble staying asleep, daytime sleepiness after 1pm, snoring, falling asleep at work, wakes up in the morning feeling tired.     Objective:  Neurologic Exam  Physical Exam Physical Examination:   Filed Vitals:   08/21/14 0932  BP: 129/85  Pulse: 75  Resp: 16    General Examination: The patient is a very pleasant 59 y.o. male in no acute distress. He appears well-developed and well-nourished and well groomed.   HEENT: Normocephalic, atraumatic, pupils are equal, round and reactive to light and accommodation. Funduscopic exam is normal with sharp disc margins noted. Extraocular tracking is good without  limitation to gaze excursion or nystagmus noted. Normal smooth pursuit is noted. Hearing is grossly intact. Tympanic membranes are clear bilaterally. Face is symmetric with normal facial animation and normal facial sensation. Speech is clear with no dysarthria noted. There is no hypophonia. There is no lip, neck/head, jaw or voice tremor. Neck is supple with full range of passive and active motion. There are no carotid bruits on auscultation. Oropharynx exam reveals: mild mouth dryness, adequate dental hygiene and moderate airway crowding, due to redundant soft palate, large uvula and tonsils are 1+ bilaterally. Mallampati is class II. Tongue protrudes centrally and palate elevates symmetrically. Neck size is 16 1/8 inches. He has a Mild overbite.    Chest: Clear to auscultation without wheezing, rhonchi or crackles noted.  Heart: S1+S2+0, regular and normal without murmurs, rubs or gallops noted.   Abdomen: Soft, non-tender and non-distended with normal bowel sounds appreciated on auscultation.  Extremities: There is no pitting edema in the distal lower extremities bilaterally. Pedal pulses are intact.  Skin: Warm and dry without trophic changes noted. There are no varicose veins.  Musculoskeletal: exam reveals no  obvious joint deformities, tenderness or joint swelling or erythema.   Neurologically:  Mental status: The patient is awake, alert and oriented in all 4 spheres. His immediate and remote memory, attention, language skills and fund of knowledge are appropriate. There is no evidence of aphasia, agnosia, apraxia or anomia. Speech is clear with normal prosody and enunciation. Thought process is linear. Mood is normal and affect is normal.  Cranial nerves II - XII are as described above under HEENT exam. In addition: shoulder shrug is normal with equal shoulder height noted. Motor exam: Normal bulk, strength and tone is noted. There is no drift, tremor or rebound. Romberg is negative. Reflexes are 2+ throughout. Fine motor skills and coordination: intact with normal finger taps, normal hand movements, normal rapid alternating patting, normal foot taps and normal foot agility.  Cerebellar testing: No dysmetria or intention tremor on finger to nose testing. Heel to shin is unremarkable bilaterally. There is no truncal or gait ataxia.  Sensory exam: intact to light touch, pinprick, vibration, temperature sense in the upper and lower extremities.  Gait, station and balance: He stands easily. No veering to one side is noted. No leaning to one side is noted. Posture is Kane-appropriate and stance is narrow based. Gait shows normal stride length and normal pace. No problems turning are noted. He turns en bloc. Tandem walk is unremarkable.   Assessment and Plan:   In summary, Cody Kane is a very pleasant 59 y.o.-year old male with an underlying medical history of reflux disease, arthritis, BPH, and overweight state, who was diagnosed with obstructive sleep apnea based on a home sleep test in October 2015. He was tried on AutoPap therapy. His sleep related complaints are in keeping with underlying obstructive sleep apnea, he also has other sleep related issues including restless sleep, restless leg type  symptoms, leg twitching at night, vivid dreams and sleep talking. While a home sleep test was a good screening tool, this patient is a candidate for a proper attended sleep study in the sleep lab, secondary to his other sleep related complaints including PLMD, and parasomnias which may be a contributor to his daytime symptoms of significant excessive somnolence.  I had a long chat with the patient about my findings and the diagnosis of OSA, its prognosis and treatment options. We also talked about good sleep hygiene. Furthermore, we talked about parasomnias  including sleep talking and restless leg syndrome for which there is symptomatic treatment. At this juncture, we will proceed with an attended sleep study, with potential split. I explained the risks and ramifications of untreated moderate to severe OSA, especially with respect to developing cardiovascular disease down the Road, including congestive heart failure, difficult to treat hypertension, cardiac arrhythmias, or stroke. Even type 2 diabetes has, in part, been linked to untreated OSA. Symptoms of untreated OSA include daytime sleepiness, memory problems, mood irritability and mood disorder such as depression and anxiety, lack of energy, as well as recurrent headaches, especially morning headaches. We talked about trying to maintain a healthy lifestyle in general, as well as the importance of weight control. I encouraged the patient to eat healthy, exercise daily and keep well hydrated, to keep a scheduled bedtime and wake time routine, to not skip any meals and eat healthy snacks in between meals. I advised the patient not to drive when feeling sleepy. I recommended the following at this time: sleep study with potential positive airway pressure titration. (We will score hypopneas at 4% and split the sleep study into diagnostic and treatment portion, if the estimated. 2 hour AHI is >20/h).   I explained the sleep test procedure to the patient and also  outlined possible surgical and non-surgical treatment options of OSA, including the use of a custom-made dental device (which would require a referral to a specialist dentist or oral surgeon), upper airway surgical options, such as pillar implants, radiofrequency surgery, tongue base surgery, and UPPP (which would involve a referral to an ENT surgeon). Rarely, jaw surgery such as mandibular advancement may be considered.  I also explained the CPAP treatment option to the patient, who indicated that he would be willing to try CPAP again if the need arises. I explained the importance of being compliant with PAP treatment, not only for insurance purposes but primarily to improve His symptoms, and for the patient's long term health benefit, including to reduce His cardiovascular risks. I answered all his questions today and the patient was in agreement. I would like to see him back after the sleep study is completed and encouraged him to call with any interim questions, concerns, problems or updates.   Thank you very much for allowing me to participate in the care of this nice patient. If I can be of any further assistance to you please do not hesitate to call me at [phone].  Sincerely,   Cody Age, MD, PhD

## 2014-09-12 ENCOUNTER — Telehealth: Payer: Self-pay | Admitting: Neurology

## 2014-09-12 DIAGNOSIS — G4761 Periodic limb movement disorder: Secondary | ICD-10-CM

## 2014-09-12 DIAGNOSIS — R51 Headache: Secondary | ICD-10-CM

## 2014-09-12 DIAGNOSIS — G478 Other sleep disorders: Secondary | ICD-10-CM

## 2014-09-12 DIAGNOSIS — G2581 Restless legs syndrome: Secondary | ICD-10-CM

## 2014-09-12 DIAGNOSIS — G4733 Obstructive sleep apnea (adult) (pediatric): Secondary | ICD-10-CM

## 2014-09-12 DIAGNOSIS — R351 Nocturia: Secondary | ICD-10-CM

## 2014-09-12 DIAGNOSIS — R519 Headache, unspecified: Secondary | ICD-10-CM

## 2014-09-12 NOTE — Telephone Encounter (Signed)
[employer] called office to inform that request for split sleep study has been denied. Home sleep test is advised. For Peer to peer review please call [phone] Pt Member ID# [id number] Service reference # [id number]

## 2014-09-13 ENCOUNTER — Telehealth: Payer: Self-pay | Admitting: Neurology

## 2014-09-13 NOTE — Telephone Encounter (Signed)
PSG denied, need order for HST

## 2014-09-15 ENCOUNTER — Ambulatory Visit (INDEPENDENT_AMBULATORY_CARE_PROVIDER_SITE_OTHER): Payer: 59 | Admitting: Sports Medicine

## 2014-09-15 ENCOUNTER — Encounter: Payer: Self-pay | Admitting: Sports Medicine

## 2014-09-15 VITALS — BP 120/76 | HR 63 | Ht 70.0 in | Wt 195.0 lb

## 2014-09-15 DIAGNOSIS — M159 Polyosteoarthritis, unspecified: Secondary | ICD-10-CM

## 2014-09-15 NOTE — Progress Notes (Signed)
Subjective:    CC: Hand osteoarthritis  HPI: Jentry has fairly moderate to severe proximal interphalangeal hand osteoarthritis, he responded extremely well to a multiple PIP injection approximately a year and a half ago. He desires repeat injections today. Pain is severe, persistent without radiation. Localizes to all 8 finger PIPs  Past medical history, Surgical history, Family history not pertinant except as noted below, Social history, Allergies, and medications have been entered into the medical record, reviewed, and no changes needed.   Review of Systems: No fevers, chills, night sweats, weight loss, chest pain, or shortness of breath.   Objective:    General: Well Developed, well nourished, and in no acute distress.  Neuro: Alert and oriented x3, extra-ocular muscles intact, sensation grossly intact.  HEENT: Normocephalic, atraumatic, pupils equal round reactive to light, neck supple, no masses, no lymphadenopathy, thyroid nonpalpable.  Skin: Warm and dry, no rashes. Cardiac: Regular rate and rhythm, no murmurs rubs or gallops, no lower extremity edema.  Respiratory: Clear to auscultation bilaterally. Not using accessory muscles, speaking in full sentences. Left and right hands: Tender to palpation with swelling of the second through fifth PIPs on both sides. Heberden and Bouchard nodes noted.  Procedure: Real-time Ultrasound Guided Injection of left second PIP Device: GE Logiq E  Verbal informed consent obtained.  Time-out conducted.  Noted no overlying erythema, induration, or other signs of local infection.  Skin prepped in a sterile fashion.  Local anesthesia: Topical Ethyl chloride.  With sterile technique and under real time ultrasound guidance:  0.5 mL kenalog 40, 0.5 mL lidocaine injected easily. Completed without difficulty  Pain immediately resolved suggesting accurate placement of the medication.  Advised to call if fevers/chills, erythema, induration, drainage, or  persistent bleeding.  Images permanently stored and available for review in the ultrasound unit.  Impression: Technically successful ultrasound guided injection.  Procedure: Real-time Ultrasound Guided Injection of left third PIP Device: GE Logiq E  Verbal informed consent obtained.  Time-out conducted.  Noted no overlying erythema, induration, or other signs of local infection.  Skin prepped in a sterile fashion.  Local anesthesia: Topical Ethyl chloride.  With sterile technique and under real time ultrasound guidance:  0.5 mL kenalog 40, 0.5 mL lidocaine injected easily. Completed without difficulty  Pain immediately resolved suggesting accurate placement of the medication.  Advised to call if fevers/chills, erythema, induration, drainage, or persistent bleeding.  Images permanently stored and available for review in the ultrasound unit.  Impression: Technically successful ultrasound guided injection.  Procedure: Real-time Ultrasound Guided Injection of left fourth PIP Device: GE Logiq E  Verbal informed consent obtained.  Time-out conducted.  Noted no overlying erythema, induration, or other signs of local infection.  Skin prepped in a sterile fashion.  Local anesthesia: Topical Ethyl chloride.  With sterile technique and under real time ultrasound guidance:  0.5 mL kenalog 40, 0.5 mL lidocaine injected easily. Completed without difficulty  Pain immediately resolved suggesting accurate placement of the medication.  Advised to call if fevers/chills, erythema, induration, drainage, or persistent bleeding.  Images permanently stored and available for review in the ultrasound unit.  Impression: Technically successful ultrasound guided injection.  Procedure: Real-time Ultrasound Guided Injection of left fifth PIP Device: GE Logiq E  Verbal informed consent obtained.  Time-out conducted.  Noted no overlying erythema, induration, or other signs of local infection.  Skin prepped in a  sterile fashion.  Local anesthesia: Topical Ethyl chloride.  With sterile technique and under real time ultrasound guidance:  0.5 mL  kenalog 40, 0.5 mL lidocaine injected easily. Completed without difficulty  Pain immediately resolved suggesting accurate placement of the medication.  Advised to call if fevers/chills, erythema, induration, drainage, or persistent bleeding.  Images permanently stored and available for review in the ultrasound unit.  Impression: Technically successful ultrasound guided injection.  Procedure: Real-time Ultrasound Guided Injection of right second PIP Device: GE Logiq E  Verbal informed consent obtained.  Time-out conducted.  Noted no overlying erythema, induration, or other signs of local infection.  Skin prepped in a sterile fashion.  Local anesthesia: Topical Ethyl chloride.  With sterile technique and under real time ultrasound guidance:  0.5 mL kenalog 40, 0.5 mL lidocaine injected easily. Completed without difficulty  Pain immediately resolved suggesting accurate placement of the medication.  Advised to call if fevers/chills, erythema, induration, drainage, or persistent bleeding.  Images permanently stored and available for review in the ultrasound unit.  Impression: Technically successful ultrasound guided injection.  Procedure: Real-time Ultrasound Guided Injection of right third PIP Device: GE Logiq E  Verbal informed consent obtained.  Time-out conducted.  Noted no overlying erythema, induration, or other signs of local infection.  Skin prepped in a sterile fashion.  Local anesthesia: Topical Ethyl chloride.  With sterile technique and under real time ultrasound guidance:  0.5 mL kenalog 40, 0.5 mL lidocaine injected easily. Completed without difficulty  Pain immediately resolved suggesting accurate placement of the medication.  Advised to call if fevers/chills, erythema, induration, drainage, or persistent bleeding.  Images permanently stored  and available for review in the ultrasound unit.  Impression: Technically successful ultrasound guided injection.  Procedure: Real-time Ultrasound Guided Injection of right fourth PIP Device: GE Logiq E  Verbal informed consent obtained.  Time-out conducted.  Noted no overlying erythema, induration, or other signs of local infection.  Skin prepped in a sterile fashion.  Local anesthesia: Topical Ethyl chloride.  With sterile technique and under real time ultrasound guidance:  0.5 mL kenalog 40, 0.5 mL lidocaine injected easily. Completed without difficulty  Pain immediately resolved suggesting accurate placement of the medication.  Advised to call if fevers/chills, erythema, induration, drainage, or persistent bleeding.  Images permanently stored and available for review in the ultrasound unit.  Impression: Technically successful ultrasound guided injection.  Procedure: Real-time Ultrasound Guided Injection of right fifth PIP Device: GE Logiq E  Verbal informed consent obtained.  Time-out conducted.  Noted no overlying erythema, induration, or other signs of local infection.  Skin prepped in a sterile fashion.  Local anesthesia: Topical Ethyl chloride.  With sterile technique and under real time ultrasound guidance:  0.5 mL kenalog 40, 0.5 mL lidocaine injected easily. Completed without difficulty  Pain immediately resolved suggesting accurate placement of the medication.  Advised to call if fevers/chills, erythema, induration, drainage, or persistent bleeding.  Images permanently stored and available for review in the ultrasound unit.  Impression: Technically successful ultrasound guided injection.  Impression and Recommendations:    I spent 40 minutes with this patient, greater than 50% was face-to-face time counseling regarding the above diagnoses

## 2014-09-15 NOTE — Assessment & Plan Note (Signed)
Injections into the left and right second through fifth proximal interphalangeal joints 19 month response to the previous injections.

## 2014-10-04 NOTE — Telephone Encounter (Signed)
This patient's insurance denied an attended sleep study. I will order home sleep test.  

## 2014-11-29 ENCOUNTER — Encounter: Payer: Self-pay | Admitting: Sports Medicine

## 2014-11-29 ENCOUNTER — Ambulatory Visit (INDEPENDENT_AMBULATORY_CARE_PROVIDER_SITE_OTHER): Payer: 59 | Admitting: Sports Medicine

## 2014-11-29 VITALS — BP 131/83 | HR 64 | Ht 70.0 in | Wt 195.0 lb

## 2014-11-29 DIAGNOSIS — Z23 Encounter for immunization: Secondary | ICD-10-CM | POA: Diagnosis not present

## 2014-11-29 DIAGNOSIS — M159 Polyosteoarthritis, unspecified: Secondary | ICD-10-CM | POA: Diagnosis not present

## 2014-11-29 LAB — RHEUMATOID FACTOR: Rheumatoid fact SerPl-aCnc: <10 [IU]/mL (ref <=14)

## 2014-11-29 LAB — CK: Total CK: 75 U/L (ref 7–232)

## 2014-11-29 LAB — URIC ACID: Uric Acid, Serum: 5.3 mg/dL (ref 4.0–7.8)

## 2014-11-29 MED ORDER — PREDNISONE 10 MG (21) PO TBPK: ORAL_TABLET | ORAL | Status: DC

## 2014-11-29 NOTE — Assessment & Plan Note (Signed)
Most recent injection was 2 months ago into multiple PIPs. It is too early to do this against we will do a prednisone taper, considering distribution as well as appearance on x-ray I think it is reasonable to go ahead and do a rheumatoid workup. Certainly we can add colchicine to the regimen.

## 2014-11-29 NOTE — Progress Notes (Signed)
  Subjective:    CC: Hand pain  HPI: 2 months ago we did multiple interphalangeal joint injections, unfortunately Cody Kane is having recurrence of pain and swelling, he does have a brother with rheumatoid arthritis. We have never done rheumatoid testing on him before. Pain is moderate, persistent without radiation. No constitutional symptoms. He did also have some evidence of marginal erosions on his hand x-rays.  Past medical history, Surgical history, Family history not pertinant except as noted below, Social history, Allergies, and medications have been entered into the medical record, reviewed, and no changes needed.   Review of Systems: No fevers, chills, night sweats, weight loss, chest pain, or shortness of breath.   Objective:    General: Well Developed, well nourished, and in no acute distress.  Neuro: Alert and oriented x3, extra-ocular muscles intact, sensation grossly intact.  HEENT: Normocephalic, atraumatic, pupils equal round reactive to light, neck supple, no masses, no lymphadenopathy, thyroid nonpalpable.  Skin: Warm and dry, no rashes. Cardiac: Regular rate and rhythm, no murmurs rubs or gallops, no lower extremity edema.  Respiratory: Clear to auscultation bilaterally. Not using accessory muscles, speaking in full sentences.  Impression and Recommendations:    I spent 25 minutes with this patient, greater than 50% was face-to-face time counseling regarding the above diagnoses

## 2014-11-30 ENCOUNTER — Other Ambulatory Visit: Payer: Self-pay | Admitting: Sports Medicine

## 2014-11-30 LAB — CBC WITH DIFFERENTIAL/PLATELET
Basophils Absolute: 0.1 10*3/uL (ref 0.0–0.1)
Basophils Relative: 2 % — ABNORMAL HIGH (ref 0–1)
Eosinophils Absolute: 0.1 K/uL (ref 0.0–0.7)
Eosinophils Relative: 2 % (ref 0–5)
HCT: 41.3 % (ref 39.0–52.0)
Hemoglobin: 13.7 g/dL (ref 13.0–17.0)
Lymphocytes Relative: 35 % (ref 12–46)
Lymphs Abs: 1.9 10*3/uL (ref 0.7–4.0)
MCH: 29.3 pg (ref 26.0–34.0)
MCHC: 33.2 g/dL (ref 30.0–36.0)
MCV: 88.2 fL (ref 78.0–100.0)
MPV: 9 fL (ref 8.6–12.4)
Monocytes Absolute: 0.5 K/uL (ref 0.1–1.0)
Monocytes Relative: 9 % (ref 3–12)
Neutro Abs: 2.9 10*3/uL (ref 1.7–7.7)
Neutrophils Relative %: 52 % (ref 43–77)
Platelets: 214 10*3/uL (ref 150–400)
RBC: 4.68 MIL/uL (ref 4.22–5.81)
RDW: 13.9 % (ref 11.5–15.5)
WBC: 5.5 K/uL (ref 4.0–10.5)

## 2014-11-30 LAB — CYCLIC CITRUL PEPTIDE ANTIBODY, IGG: Cyclic Citrullin Peptide Ab: <16 U

## 2014-11-30 LAB — SEDIMENTATION RATE: Sed Rate: 5 mm/h (ref 0–20)

## 2014-11-30 LAB — LYME AB/WESTERN BLOT REFLEX: B burgdorferi Ab IgG+IgM: 0.25 {ISR}

## 2014-11-30 LAB — ANA: Anti Nuclear Antibody(ANA): NEGATIVE

## 2014-11-30 MED ORDER — OMEPRAZOLE 40 MG PO CPDR: 40.0000 mg | DELAYED_RELEASE_CAPSULE | Freq: Every morning | ORAL | Status: DC

## 2014-12-01 LAB — ROCKY MTN SPOTTED FVR ABS PNL(IGG+IGM)
RMSF IgG: 0.13 IV
RMSF IgM: 0.34 IV

## 2014-12-03 LAB — EHRLICHIA ANTIBODY PANEL
E chaffeensis (HGE) Ab, IgG: <1:64 {titer}
E chaffeensis (HGE) Ab, IgM: <1:20 {titer}

## 2014-12-04 LAB — HLA-B27 ANTIGEN: DNA Result:: NOT DETECTED

## 2014-12-18 ENCOUNTER — Telehealth: Payer: Self-pay | Admitting: Neurology

## 2014-12-18 ENCOUNTER — Encounter: Payer: Self-pay | Admitting: Sports Medicine

## 2014-12-18 DIAGNOSIS — G4761 Periodic limb movement disorder: Secondary | ICD-10-CM

## 2014-12-18 DIAGNOSIS — R351 Nocturia: Secondary | ICD-10-CM

## 2014-12-18 DIAGNOSIS — G2581 Restless legs syndrome: Secondary | ICD-10-CM

## 2014-12-18 DIAGNOSIS — G478 Other sleep disorders: Secondary | ICD-10-CM

## 2014-12-18 DIAGNOSIS — R51 Headache: Secondary | ICD-10-CM

## 2014-12-18 DIAGNOSIS — R519 Headache, unspecified: Secondary | ICD-10-CM

## 2014-12-18 DIAGNOSIS — G4733 Obstructive sleep apnea (adult) (pediatric): Secondary | ICD-10-CM

## 2014-12-18 NOTE — Telephone Encounter (Signed)
Order is in.

## 2014-12-18 NOTE — Telephone Encounter (Signed)
Patient called and is now ready for his HST.  Didn't have the money in the past but does have it now.  Can you please place the order?

## 2014-12-25 ENCOUNTER — Ambulatory Visit (INDEPENDENT_AMBULATORY_CARE_PROVIDER_SITE_OTHER): Payer: 59 | Admitting: Sports Medicine

## 2014-12-25 ENCOUNTER — Encounter: Payer: Self-pay | Admitting: Sports Medicine

## 2014-12-25 VITALS — BP 108/53 | HR 74 | Wt 200.0 lb

## 2014-12-25 DIAGNOSIS — M159 Polyosteoarthritis, unspecified: Secondary | ICD-10-CM | POA: Diagnosis not present

## 2014-12-25 NOTE — Assessment & Plan Note (Signed)
Injection of the right first through fifth proximal interphalangeal (and IP on thumb) joints as well as the left second and third proximal interphalangeal joint, return as needed.

## 2014-12-25 NOTE — Progress Notes (Signed)
Subjective:    CC: Generalized hand osteoarthritis  HPI: Cody Kane is a very pleasant 59 year old male, he has known generalized hand osteoarthritis that has responded to occasional interphalangeal joint injections, we did a full rheumatoid workup at the last visit that was completely negative, though he does have a first-degree relative with rheumatoid arthritis. Previous injections of provided 3 months of relief, he is here today with complaints of moderate, persistent pain localized at the right second through fifth proximal interphalangeal joints, right first interphalangeal joint and the left second and third proximal interphalangeal joint and he desires interventional treatment today.  Past medical history, Surgical history, Family history not pertinant except as noted below, Social history, Allergies, and medications have been entered into the medical record, reviewed, and no changes needed.   Review of Systems: No fevers, chills, night sweats, weight loss, chest pain, or shortness of breath.   Objective:    General: Well Developed, well nourished, and in no acute distress.  Neuro: Alert and oriented x3, extra-ocular muscles intact, sensation grossly intact.  HEENT: Normocephalic, atraumatic, pupils equal round reactive to light, neck supple, no masses, no lymphadenopathy, thyroid nonpalpable.  Skin: Warm and dry, no rashes. Cardiac: Regular rate and rhythm, no murmurs rubs or gallops, no lower extremity edema.  Respiratory: Clear to auscultation bilaterally. Not using accessory muscles, speaking in full sentences.  Procedure: Real-time Ultrasound Guided Injection of right second PIP joint Device: GE Logiq E  Verbal informed consent obtained.  Time-out conducted.  Noted no overlying erythema, induration, or other signs of local infection.  Skin prepped in a sterile fashion.  Local anesthesia: Topical Ethyl chloride.  With sterile technique and under real time ultrasound guidance:   0.5 mL kenalog 40, 0.5 mL lidocaine injected easily Completed without difficulty  Pain immediately resolved suggesting accurate placement of the medication.  Advised to call if fevers/chills, erythema, induration, drainage, or persistent bleeding.  Images permanently stored and available for review in the ultrasound unit.  Impression: Technically successful ultrasound guided injection.  Procedure: Real-time Ultrasound Guided Injection of right third PIP joint Device: GE Logiq E  Verbal informed consent obtained.  Time-out conducted.  Noted no overlying erythema, induration, or other signs of local infection.  Skin prepped in a sterile fashion.  Local anesthesia: Topical Ethyl chloride.  With sterile technique and under real time ultrasound guidance:  0.5 mL kenalog 40, 0.5 mL lidocaine injected easily Completed without difficulty  Pain immediately resolved suggesting accurate placement of the medication.  Advised to call if fevers/chills, erythema, induration, drainage, or persistent bleeding.  Images permanently stored and available for review in the ultrasound unit.  Impression: Technically successful ultrasound guided injection.  Procedure: Real-time Ultrasound Guided Injection of right fourth PIP joint Device: GE Logiq E  Verbal informed consent obtained.  Time-out conducted.  Noted no overlying erythema, induration, or other signs of local infection.  Skin prepped in a sterile fashion.  Local anesthesia: Topical Ethyl chloride.  With sterile technique and under real time ultrasound guidance:  0.5 mL kenalog 40, 0.5 mL lidocaine injected easily Completed without difficulty  Pain immediately resolved suggesting accurate placement of the medication.  Advised to call if fevers/chills, erythema, induration, drainage, or persistent bleeding.  Images permanently stored and available for review in the ultrasound unit.  Impression: Technically successful ultrasound guided  injection.  Procedure: Real-time Ultrasound Guided Injection of right fifth PIP joint Device: GE Logiq E  Verbal informed consent obtained.  Time-out conducted.  Noted no overlying erythema, induration, or  other signs of local infection.  Skin prepped in a sterile fashion.  Local anesthesia: Topical Ethyl chloride.  With sterile technique and under real time ultrasound guidance:  0.5 mL kenalog 40, 0.5 mL lidocaine injected easily Completed without difficulty  Pain immediately resolved suggesting accurate placement of the medication.  Advised to call if fevers/chills, erythema, induration, drainage, or persistent bleeding.  Images permanently stored and available for review in the ultrasound unit.  Impression: Technically successful ultrasound guided injection.  Procedure: Real-time Ultrasound Guided Injection of right first interphalangeal joint Device: GE Logiq E  Verbal informed consent obtained.  Time-out conducted.  Noted no overlying erythema, induration, or other signs of local infection.  Skin prepped in a sterile fashion.  Local anesthesia: Topical Ethyl chloride.  With sterile technique and under real time ultrasound guidance:  0.5 mL kenalog 40, 0.5 mL lidocaine injected easily Completed without difficulty  Pain immediately resolved suggesting accurate placement of the medication.  Advised to call if fevers/chills, erythema, induration, drainage, or persistent bleeding.  Images permanently stored and available for review in the ultrasound unit.  Impression: Technically successful ultrasound guided injection.  Procedure: Real-time Ultrasound Guided Injection of left second PIP joint Device: GE Logiq E  Verbal informed consent obtained.  Time-out conducted.  Noted no overlying erythema, induration, or other signs of local infection.  Skin prepped in a sterile fashion.  Local anesthesia: Topical Ethyl chloride.  With sterile technique and under real time ultrasound  guidance:  0.5 mL kenalog 40, 0.5 mL lidocaine injected easily Completed without difficulty  Pain immediately resolved suggesting accurate placement of the medication.  Advised to call if fevers/chills, erythema, induration, drainage, or persistent bleeding.  Images permanently stored and available for review in the ultrasound unit.  Impression: Technically successful ultrasound guided injection.  Procedure: Real-time Ultrasound Guided Injection of left third PIP joint Device: GE Logiq E  Verbal informed consent obtained.  Time-out conducted.  Noted no overlying erythema, induration, or other signs of local infection.  Skin prepped in a sterile fashion.  Local anesthesia: Topical Ethyl chloride.  With sterile technique and under real time ultrasound guidance:  0.5 mL kenalog 40, 0.5 mL lidocaine injected easily Completed without difficulty  Pain immediately resolved suggesting accurate placement of the medication.  Advised to call if fevers/chills, erythema, induration, drainage, or persistent bleeding.  Images permanently stored and available for review in the ultrasound unit.  Impression: Technically successful ultrasound guided injection.  Impression and Recommendations:

## 2014-12-28 ENCOUNTER — Encounter (INDEPENDENT_AMBULATORY_CARE_PROVIDER_SITE_OTHER): Payer: 59 | Admitting: Neurology

## 2014-12-28 DIAGNOSIS — R351 Nocturia: Secondary | ICD-10-CM

## 2014-12-28 DIAGNOSIS — R51 Headache: Secondary | ICD-10-CM

## 2014-12-28 DIAGNOSIS — G2581 Restless legs syndrome: Secondary | ICD-10-CM

## 2014-12-28 DIAGNOSIS — G478 Other sleep disorders: Secondary | ICD-10-CM

## 2014-12-28 DIAGNOSIS — G4761 Periodic limb movement disorder: Secondary | ICD-10-CM

## 2014-12-28 DIAGNOSIS — R519 Headache, unspecified: Secondary | ICD-10-CM

## 2014-12-28 DIAGNOSIS — G4733 Obstructive sleep apnea (adult) (pediatric): Secondary | ICD-10-CM

## 2014-12-29 ENCOUNTER — Telehealth: Payer: Self-pay | Admitting: Neurology

## 2014-12-29 NOTE — Telephone Encounter (Signed)
Please call and notify the patient that the recent home sleep test did not show any significant obstructive sleep apnea. Patient can follow up with the referring provider, or, if he wishes, he can make FU appointment with me. I recommend avoidance of the supine sleep position, weight loss and good sleep hygiene: Keep a regular sleep and wake schedule, try not to exercise or have a meal within 2 hours of your bedtime, try to keep your bedroom conducive for sleep, that is, cool and dark, without light distractors such as an illuminated alarm clock, and refrain from watching TV right before sleep or in the middle of the night and do not keep the TV or radio on during the night. Also, try not to use or play on electronic devices at bedtime, such as your cell phone, tablet PC or laptop. If you like to read at bedtime on an electronic device, try to dim the background light as much as possible. Do not eat in the middle of the night.  A copy of the report will be sent to the PCP and referring MD, if other than PCP.  Once you have spoken to patient, you can close this encounter.   Thanks,  Star Age, MD, PhD [facility])

## 2015-01-02 NOTE — Telephone Encounter (Signed)
I left message on vm stating that the results of sleep study are normal and he should follow up with PCP or is welcome to make appt with Dr. Rexene Alberts. I will fax report to PCP and send patient a list of sleep hygiene pointers.

## 2015-01-17 ENCOUNTER — Encounter: Payer: Self-pay | Admitting: Sports Medicine

## 2015-01-17 ENCOUNTER — Ambulatory Visit (INDEPENDENT_AMBULATORY_CARE_PROVIDER_SITE_OTHER): Payer: 59 | Admitting: Sports Medicine

## 2015-01-17 VITALS — BP 143/83 | HR 81 | Temp 98.1°F | Resp 18 | Wt 196.9 lb

## 2015-01-17 DIAGNOSIS — H8113 Benign paroxysmal vertigo, bilateral: Secondary | ICD-10-CM | POA: Diagnosis not present

## 2015-01-17 DIAGNOSIS — G43109 Migraine with aura, not intractable, without status migrainosus: Secondary | ICD-10-CM

## 2015-01-17 DIAGNOSIS — G43809 Other migraine, not intractable, without status migrainosus: Secondary | ICD-10-CM | POA: Insufficient documentation

## 2015-01-17 MED ORDER — DIAZEPAM 5 MG PO TABS: 5.0000 mg | ORAL_TABLET | Freq: Three times a day (TID) | ORAL | Status: DC | PRN

## 2015-01-17 NOTE — Progress Notes (Signed)
  Subjective:    CC: Dizzy  HPI: 2 days ago this pleasant 59 year old male woke up feeling somewhat sick, when he sat up he developed severe dizziness, and had great difficulty keeping his balance, he also had significant nausea but no slurred speech, visual changes, weakness on one side of the body. He noted that his symptoms were well-controlled unless he moved his head. He denied any tinnitus during the episode, and had no changes in his hearing. Symptoms were moderate, intermittent, and have not yet recurred.  Past medical history, Surgical history, Family history not pertinant except as noted below, Social history, Allergies, and medications have been entered into the medical record, reviewed, and no changes needed.   Review of Systems: No fevers, chills, night sweats, weight loss, chest pain, or shortness of breath.   Objective:    General: Well Developed, well nourished, and in no acute distress.  Neuro: Alert and oriented x3, extra-ocular muscles intact, sensation grossly intact. Cranial nerves II through XII are intact, motor, sensory, and coordinative functions were all intact. Negative Romberg sign, no pronator drift. HEENT: Normocephalic, atraumatic, pupils equal round reactive to light, neck supple, no masses, no lymphadenopathy, thyroid nonpalpable. Negative Dix-Hallpike test bilaterally Skin: Warm and dry, no rashes. Cardiac: Regular rate and rhythm, no murmurs rubs or gallops, no lower extremity edema.  Respiratory: Clear to auscultation bilaterally. Not using accessory muscles, speaking in full sentences.  Impression and Recommendations:

## 2015-01-17 NOTE — Assessment & Plan Note (Signed)
Cody Kane had a single episode on Monday that resolved after a few hours, and was associated with a rapid change in position of his head. No acoustic symptoms, no tinnitus during the episode. No TIA type symptoms. Adding Valium and vestibular rehabilitation.

## 2015-01-17 NOTE — Patient Instructions (Signed)
Benign Positional Vertigo Vertigo is the feeling that you or your surroundings are moving when they are not. Benign positional vertigo is the most common form of vertigo. The cause of this condition is not serious (is benign). This condition is triggered by certain movements and positions (is positional). This condition can be dangerous if it occurs while you are doing something that could endanger you or others, such as driving.  CAUSES In many cases, the cause of this condition is not known. It may be caused by a disturbance in an area of the inner ear that helps your brain to sense movement and balance. This disturbance can be caused by a viral infection (labyrinthitis), head injury, or repetitive motion. RISK FACTORS This condition is more likely to develop in:  Women.  People who are 50 years of age or older. SYMPTOMS Symptoms of this condition usually happen when you move your head or your eyes in different directions. Symptoms may start suddenly, and they usually last for less than a minute. Symptoms may include:  Loss of balance and falling.  Feeling like you are spinning or moving.  Feeling like your surroundings are spinning or moving.  Nausea and vomiting.  Blurred vision.  Dizziness.  Involuntary eye movement (nystagmus). Symptoms can be mild and cause only slight annoyance, or they can be severe and interfere with daily life. Episodes of benign positional vertigo may return (recur) over time, and they may be triggered by certain movements. Symptoms may improve over time. DIAGNOSIS This condition is usually diagnosed by medical history and a physical exam of the head, neck, and ears. You may be referred to a health care provider who specializes in ear, nose, and throat (ENT) problems (otolaryngologist) or a provider who specializes in disorders of the nervous system (neurologist). You may have additional testing, including:  MRI.  A CT scan.  Eye movement tests. Your  health care provider may ask you to change positions quickly while he or she watches you for symptoms of benign positional vertigo, such as nystagmus. Eye movement may be tested with an electronystagmogram (ENG), caloric stimulation, the Dix-Hallpike test, or the roll test.  An electroencephalogram (EEG). This records electrical activity in your brain.  Hearing tests. TREATMENT Usually, your health care provider will treat this by moving your head in specific positions to adjust your inner ear back to normal. Surgery may be needed in severe cases, but this is rare. In some cases, benign positional vertigo may resolve on its own in 2-4 weeks. HOME CARE INSTRUCTIONS Safety  Move slowly.Avoid sudden body or head movements.  Avoid driving.  Avoid operating heavy machinery.  Avoid doing any tasks that would be dangerous to you or others if a vertigo episode would occur.  If you have trouble walking or keeping your balance, try using a cane for stability. If you feel dizzy or unstable, sit down right away.  Return to your normal activities as told by your health care provider. Ask your health care provider what activities are safe for you. General Instructions  Take over-the-counter and prescription medicines only as told by your health care provider.  Avoid certain positions or movements as told by your health care provider.  Drink enough fluid to keep your urine clear or pale yellow.  Keep all follow-up visits as told by your health care provider. This is important. SEEK MEDICAL CARE IF:  You have a fever.  Your condition gets worse or you develop new symptoms.  Your family or friends   notice any behavioral changes.  Your nausea or vomiting gets worse.  You have numbness or a "pins and needles" sensation. SEEK IMMEDIATE MEDICAL CARE IF:  You have difficulty speaking or moving.  You are always dizzy.  You faint.  You develop severe headaches.  You have weakness in your  legs or arms.  You have changes in your hearing or vision.  You develop a stiff neck.  You develop sensitivity to light.   This information is not intended to replace advice given to you by your health care provider. Make sure you discuss any questions you have with your health care provider.   Document Released: 11/25/2005 Document Revised: 11/08/2014 Document Reviewed: 06/12/2014 Elsevier Interactive Patient Education 2016 [employer]  

## 2015-01-18 ENCOUNTER — Other Ambulatory Visit: Payer: Self-pay | Admitting: *Deleted

## 2015-01-18 MED ORDER — OMEPRAZOLE 40 MG PO CPDR: 40.0000 mg | DELAYED_RELEASE_CAPSULE | Freq: Every morning | ORAL | Status: DC

## 2015-01-23 ENCOUNTER — Ambulatory Visit: Payer: 59 | Admitting: Physical Therapy

## 2015-01-23 ENCOUNTER — Encounter: Payer: Self-pay | Admitting: Sports Medicine

## 2015-02-13 ENCOUNTER — Ambulatory Visit (INDEPENDENT_AMBULATORY_CARE_PROVIDER_SITE_OTHER): Payer: 59 | Admitting: Sports Medicine

## 2015-02-13 VITALS — BP 145/78 | HR 72 | Temp 98.1°F | Resp 18 | Wt 197.0 lb

## 2015-02-13 DIAGNOSIS — G4700 Insomnia, unspecified: Secondary | ICD-10-CM | POA: Diagnosis not present

## 2015-02-13 DIAGNOSIS — I639 Cerebral infarction, unspecified: Secondary | ICD-10-CM | POA: Diagnosis not present

## 2015-02-13 DIAGNOSIS — H8113 Benign paroxysmal vertigo, bilateral: Secondary | ICD-10-CM | POA: Diagnosis not present

## 2015-02-13 MED ORDER — MECLIZINE HCL 25 MG PO CHEW: CHEWABLE_TABLET | ORAL | Status: DC

## 2015-02-13 NOTE — Assessment & Plan Note (Signed)
After failure of multiple sleep agents, it is now well controlled with one half of a Valium daily at bedtime.

## 2015-02-13 NOTE — Assessment & Plan Note (Signed)
Persistent symptoms, this time without changes in his head position. We are going to obtain an MRI/MRA brain, carotid Dopplers, echocardiogram. Referral to ENT for further assistance, adding a bit of meclizine as well.

## 2015-02-13 NOTE — Progress Notes (Signed)
  Subjective:    CC: Follow-up  HPI: Vertigo: Persistent, and no longer simply associated with changes in head position. Denies any visual changes, slurred speech, numbness or tingling in any of extremities, denies any tinnitus, ear pressure, or changes in his hearing. Unfortunately he did have an episode of vertigo when standing still. He has tried the Epley maneuver and we have treated him with steroids and decongestants without any improvements, exam has been overall unremarkable.  Insomnia: Improved with Valium that was prescribed initially for vertigo. He is happy with one half tab at bedtime.  Past medical history, Surgical history, Family history not pertinant except as noted below, Social history, Allergies, and medications have been entered into the medical record, reviewed, and no changes needed.   Review of Systems: No fevers, chills, night sweats, weight loss, chest pain, or shortness of breath.   Objective:    General: Well Developed, well nourished, and in no acute distress.  Neuro: Alert and oriented x3, extra-ocular muscles intact, sensation grossly intact.  HEENT: Normocephalic, atraumatic, pupils equal round reactive to light, neck supple, no masses, no lymphadenopathy, thyroid nonpalpable.  Skin: Warm and dry, no rashes. Cardiac: Regular rate and rhythm, no murmurs rubs or gallops, no lower extremity edema.  Respiratory: Clear to auscultation bilaterally. Not using accessory muscles, speaking in full sentences.  Impression and Recommendations:    I spent 25 minutes with this patient, greater than 50% was face-to-face time counseling regarding the above diagnoses

## 2015-02-14 ENCOUNTER — Ambulatory Visit (HOSPITAL_BASED_OUTPATIENT_CLINIC_OR_DEPARTMENT_OTHER): Admission: RE | Admit: 2015-02-14 | Payer: 59 | Source: Ambulatory Visit

## 2015-02-14 ENCOUNTER — Ambulatory Visit: Payer: 59 | Admitting: Sports Medicine

## 2015-02-14 ENCOUNTER — Ambulatory Visit (HOSPITAL_BASED_OUTPATIENT_CLINIC_OR_DEPARTMENT_OTHER)
Admission: RE | Admit: 2015-02-14 | Discharge: 2015-02-14 | Disposition: A | Discharge status: Home / Self Care | Payer: 59 | Source: Ambulatory Visit | Attending: Sports Medicine | Admitting: Sports Medicine

## 2015-02-14 DIAGNOSIS — G459 Transient cerebral ischemic attack, unspecified: Secondary | ICD-10-CM | POA: Insufficient documentation

## 2015-02-14 DIAGNOSIS — H8113 Benign paroxysmal vertigo, bilateral: Secondary | ICD-10-CM

## 2015-02-14 DIAGNOSIS — E785 Hyperlipidemia, unspecified: Secondary | ICD-10-CM | POA: Insufficient documentation

## 2015-02-14 DIAGNOSIS — I517 Cardiomegaly: Secondary | ICD-10-CM | POA: Insufficient documentation

## 2015-02-14 NOTE — Addendum Note (Signed)
Addended by: Elizabeth Sauer on: 02/14/2015 02:32 PM   Modules accepted: Orders

## 2015-02-14 NOTE — Progress Notes (Signed)
  Echocardiogram 2D Echocardiogram has been performed.  Donata Clay 02/14/2015, 11:40 AM

## 2015-02-15 ENCOUNTER — Other Ambulatory Visit: Payer: Self-pay | Admitting: Sports Medicine

## 2015-02-15 DIAGNOSIS — I639 Cerebral infarction, unspecified: Secondary | ICD-10-CM

## 2015-02-17 ENCOUNTER — Ambulatory Visit (HOSPITAL_BASED_OUTPATIENT_CLINIC_OR_DEPARTMENT_OTHER): Payer: 59

## 2015-02-17 ENCOUNTER — Ambulatory Visit (HOSPITAL_BASED_OUTPATIENT_CLINIC_OR_DEPARTMENT_OTHER)
Admission: RE | Admit: 2015-02-17 | Discharge: 2015-02-17 | Disposition: A | Discharge status: Home / Self Care | Payer: 59 | Source: Ambulatory Visit | Attending: Sports Medicine | Admitting: Sports Medicine

## 2015-02-17 DIAGNOSIS — I779 Disorder of arteries and arterioles, unspecified: Secondary | ICD-10-CM | POA: Diagnosis not present

## 2015-02-17 DIAGNOSIS — H8113 Benign paroxysmal vertigo, bilateral: Secondary | ICD-10-CM | POA: Diagnosis present

## 2015-02-17 DIAGNOSIS — G479 Sleep disorder, unspecified: Secondary | ICD-10-CM | POA: Diagnosis not present

## 2015-02-17 DIAGNOSIS — R410 Disorientation, unspecified: Secondary | ICD-10-CM | POA: Insufficient documentation

## 2015-02-17 DIAGNOSIS — R51 Headache: Secondary | ICD-10-CM | POA: Diagnosis not present

## 2015-02-17 DIAGNOSIS — I639 Cerebral infarction, unspecified: Secondary | ICD-10-CM

## 2015-02-17 DIAGNOSIS — H538 Other visual disturbances: Secondary | ICD-10-CM | POA: Diagnosis not present

## 2015-02-22 ENCOUNTER — Encounter: Payer: Self-pay | Admitting: Sports Medicine

## 2015-02-27 ENCOUNTER — Encounter: Payer: Self-pay | Admitting: Sports Medicine

## 2015-02-27 ENCOUNTER — Ambulatory Visit (INDEPENDENT_AMBULATORY_CARE_PROVIDER_SITE_OTHER): Payer: 59 | Admitting: Sports Medicine

## 2015-02-27 VITALS — BP 145/86 | HR 79 | Temp 98.4°F | Resp 18 | Wt 197.0 lb

## 2015-02-27 DIAGNOSIS — G4700 Insomnia, unspecified: Secondary | ICD-10-CM | POA: Diagnosis not present

## 2015-02-27 DIAGNOSIS — H8113 Benign paroxysmal vertigo, bilateral: Secondary | ICD-10-CM | POA: Diagnosis not present

## 2015-02-27 MED ORDER — DIAZEPAM 5 MG PO TABS: 5.0000 mg | ORAL_TABLET | Freq: Every day | ORAL | Status: DC

## 2015-02-27 NOTE — Progress Notes (Signed)
  Subjective:    CC: follow-up  HPI: Cody Kane returns, he has occasional episodes of dizziness, we've put him through the stroke workup, brain MRI, MRA was negative, carotid Dopplers were negative.  Valium and meclizine have been a bit helpful, and symptoms only last for a few minutes, he also had some ringing in his ears which has since resolved. He has not yet touched base with ENT. Has been doing the Epley maneuver daily.  Insomnia: Well-controlled with Valium. We have already tried many other hypnotic agents including Belsomra without much improvement.  Past medical history, Surgical history, Family history not pertinant except as noted below, Social history, Allergies, and medications have been entered into the medical record, reviewed, and no changes needed.   Review of Systems: No fevers, chills, night sweats, weight loss, chest pain, or shortness of breath.   Objective:    General: Well Developed, well nourished, and in no acute distress.  Neuro: Alert and oriented x3, extra-ocular muscles intact, sensation grossly intact.  HEENT: Normocephalic, atraumatic, pupils equal round reactive to light, neck supple, no masses, no lymphadenopathy, thyroid nonpalpable.  Skin: Warm and dry, no rashes. Cardiac: Regular rate and rhythm, no murmurs rubs or gallops, no lower extremity edema.  Respiratory: Clear to auscultation bilaterally. Not using accessory muscles, speaking in full sentences.  Impression and Recommendations:   I spent 25 minutes with this patient, greater than 50% was face-to-face time counseling regarding the above diagnoses

## 2015-02-27 NOTE — Assessment & Plan Note (Signed)
- 

## 2015-02-27 NOTE — Patient Instructions (Signed)
PENTA at [phone]

## 2015-02-27 NOTE — Assessment & Plan Note (Addendum)
Tinnitus has improved however still has a sensation of pressure and occasional episodes of vertigo. Has been doing the Epley maneuver regularly, without any improvement. Some of this is reminiscent of Mnire's disease, he has not yet seen his ENT doctor. MRI and MRA of the brain as well as carotid Dopplers were negative, sinuses were also clear on the MRI.

## 2015-04-04 ENCOUNTER — Encounter: Payer: Self-pay | Admitting: Sports Medicine

## 2015-04-04 ENCOUNTER — Ambulatory Visit (INDEPENDENT_AMBULATORY_CARE_PROVIDER_SITE_OTHER): Payer: 59 | Admitting: Sports Medicine

## 2015-04-04 VITALS — BP 122/83 | HR 64 | Resp 18 | Wt 201.4 lb

## 2015-04-04 DIAGNOSIS — G4700 Insomnia, unspecified: Secondary | ICD-10-CM

## 2015-04-04 DIAGNOSIS — G43109 Migraine with aura, not intractable, without status migrainosus: Secondary | ICD-10-CM | POA: Diagnosis not present

## 2015-04-04 DIAGNOSIS — M159 Polyosteoarthritis, unspecified: Secondary | ICD-10-CM

## 2015-04-04 DIAGNOSIS — G43809 Other migraine, not intractable, without status migrainosus: Secondary | ICD-10-CM

## 2015-04-04 MED ORDER — TOPIRAMATE 50 MG PO TABS: ORAL_TABLET | ORAL | Status: DC

## 2015-04-04 NOTE — Assessment & Plan Note (Signed)
After advanced imaging of the brain, sinuses, visit with ear nose and throat, as well as vestibular rehabilitation, continues to have occasional vertigo. On further questioning today he does get a dull throbbing headache that last for several hours and is associated with nausea and photophobia and phonophobia. This makes the likely diagnosis vestibular migraines which we will go ahead and treat with Topamax. Return to see me in one month.

## 2015-04-04 NOTE — Assessment & Plan Note (Signed)
Has done well since injection over 3 months ago. Repeat left and right second, third, and fourth proximal interphalangeal joint injections.

## 2015-04-04 NOTE — Assessment & Plan Note (Signed)
Continues to do well with Valium daily at bedtime, other sleep aids have been ineffective.

## 2015-04-04 NOTE — Progress Notes (Signed)
Subjective:    CC:  Finger pain  HPI: Hand osteoarthritis:  Gets occasional interphalangeal joint injections, previous injection was 3-4 months ago, desires repeat injections into the second through fourth PIPs bilaterally.  Vertigo: now has had a negative brain MRI, sinus advanced imaging, ENT visit without a diagnosis. We have treated him with Valium and other vestibular suppressants as well as vestibular rehabilitation, on further questioning he does endorse that he developed and on since of dizziness, followed by a right-sided dull headache with photophobia, phonophobia, and nausea. We have not yet treated him as a vestibular migraine.  Past medical history, Surgical history, Family history not pertinant except as noted below, Social history, Allergies, and medications have been entered into the medical record, reviewed, and no changes needed.   Review of Systems: No fevers, chills, night sweats, weight loss, chest pain, or shortness of breath.   Objective:    General: Well Developed, well nourished, and in no acute distress.  Neuro: Alert and oriented x3, extra-ocular muscles intact, sensation grossly intact.  HEENT: Normocephalic, atraumatic, pupils equal round reactive to light, neck supple, no masses, no lymphadenopathy, thyroid nonpalpable.  Skin: Warm and dry, no rashes. Cardiac: Regular rate and rhythm, no murmurs rubs or gallops, no lower extremity edema.  Respiratory: Clear to auscultation bilaterally. Not using accessory muscles, speaking in full sentences.  Procedure: Real-time Ultrasound Guided Injection of left second PIP Device: GE Logiq E  Verbal informed consent obtained.  Time-out conducted.  Noted no overlying erythema, induration, or other signs of local infection.  Skin prepped in a sterile fashion.  Local anesthesia: Topical Ethyl chloride.  With sterile technique and under real time ultrasound guidance:  Using a 30-gauge needle and injected 1/2 mL kenalog 40,  1/2 mL lidocaine Completed without difficulty  Pain immediately resolved suggesting accurate placement of the medication.  Advised to call if fevers/chills, erythema, induration, drainage, or persistent bleeding.  Images permanently stored and available for review in the ultrasound unit.  Impression: Technically successful ultrasound guided injection.  Procedure: Real-time Ultrasound Guided Injection of left third PIP Device: GE Logiq E  Verbal informed consent obtained.  Time-out conducted.  Noted no overlying erythema, induration, or other signs of local infection.  Skin prepped in a sterile fashion.  Local anesthesia: Topical Ethyl chloride.  With sterile technique and under real time ultrasound guidance:  Using a 30-gauge needle and injected 1/2 mL kenalog 40, 1/2 mL lidocaine Completed without difficulty  Pain immediately resolved suggesting accurate placement of the medication.  Advised to call if fevers/chills, erythema, induration, drainage, or persistent bleeding.  Images permanently stored and available for review in the ultrasound unit.  Impression: Technically successful ultrasound guided injection.  Procedure: Real-time Ultrasound Guided Injection of left fourth PIP Device: GE Logiq E  Verbal informed consent obtained.  Time-out conducted.  Noted no overlying erythema, induration, or other signs of local infection.  Skin prepped in a sterile fashion.  Local anesthesia: Topical Ethyl chloride.  With sterile technique and under real time ultrasound guidance:  Using a 30-gauge needle and injected 1/2 mL kenalog 40, 1/2 mL lidocaine Completed without difficulty  Pain immediately resolved suggesting accurate placement of the medication.  Advised to call if fevers/chills, erythema, induration, drainage, or persistent bleeding.  Images permanently stored and available for review in the ultrasound unit.  Impression: Technically successful ultrasound guided  injection.  Procedure: Real-time Ultrasound Guided Injection of right second PIP Device: GE Logiq E  Verbal informed consent obtained.  Time-out conducted.  Noted no overlying erythema, induration, or other signs of local infection.  Skin prepped in a sterile fashion.  Local anesthesia: Topical Ethyl chloride.  With sterile technique and under real time ultrasound guidance:  Using a 30-gauge needle and injected 1/2 mL kenalog 40, 1/2 mL lidocaine Completed without difficulty  Pain immediately resolved suggesting accurate placement of the medication.  Advised to call if fevers/chills, erythema, induration, drainage, or persistent bleeding.  Images permanently stored and available for review in the ultrasound unit.  Impression: Technically successful ultrasound guided injection.  Procedure: Real-time Ultrasound Guided Injection of right third PIP Device: GE Logiq E  Verbal informed consent obtained.  Time-out conducted.  Noted no overlying erythema, induration, or other signs of local infection.  Skin prepped in a sterile fashion.  Local anesthesia: Topical Ethyl chloride.  With sterile technique and under real time ultrasound guidance:  Using a 30-gauge needle and injected 1/2 mL kenalog 40, 1/2 mL lidocaine Completed without difficulty  Pain immediately resolved suggesting accurate placement of the medication.  Advised to call if fevers/chills, erythema, induration, drainage, or persistent bleeding.  Images permanently stored and available for review in the ultrasound unit.  Impression: Technically successful ultrasound guided injection.  Procedure: Real-time Ultrasound Guided Injection of right fourth PIP Device: GE Logiq E  Verbal informed consent obtained.  Time-out conducted.  Noted no overlying erythema, induration, or other signs of local infection.  Skin prepped in a sterile fashion.  Local anesthesia: Topical Ethyl chloride.  With sterile technique and under real time  ultrasound guidance:  Using a 30-gauge needle and injected 1/2 mL kenalog 40, 1/2 mL lidocaine Completed without difficulty  Pain immediately resolved suggesting accurate placement of the medication.  Advised to call if fevers/chills, erythema, induration, drainage, or persistent bleeding.  Images permanently stored and available for review in the ultrasound unit.  Impression: Technically successful ultrasound guided injection.  Impression and Recommendations:

## 2015-04-20 ENCOUNTER — Encounter: Payer: Self-pay | Admitting: Sports Medicine

## 2015-05-02 ENCOUNTER — Ambulatory Visit (INDEPENDENT_AMBULATORY_CARE_PROVIDER_SITE_OTHER): Payer: 59 | Admitting: Sports Medicine

## 2015-05-02 VITALS — BP 137/83 | HR 88 | Resp 18 | Wt 186.1 lb

## 2015-05-02 DIAGNOSIS — M159 Polyosteoarthritis, unspecified: Secondary | ICD-10-CM

## 2015-05-02 DIAGNOSIS — G43109 Migraine with aura, not intractable, without status migrainosus: Secondary | ICD-10-CM | POA: Diagnosis not present

## 2015-05-02 DIAGNOSIS — Z7952 Long term (current) use of systemic steroids: Secondary | ICD-10-CM | POA: Diagnosis not present

## 2015-05-02 DIAGNOSIS — G43809 Other migraine, not intractable, without status migrainosus: Secondary | ICD-10-CM

## 2015-05-02 MED ORDER — PREDNISONE 5 MG PO TABS: 5.0000 mg | ORAL_TABLET | Freq: Every day | ORAL | Status: DC

## 2015-05-02 NOTE — Assessment & Plan Note (Signed)
Interestingly has resolved after stopping all medications.

## 2015-05-02 NOTE — Progress Notes (Signed)
  Subjective:    CC: Follow-up  HPI: Hand osteoarthritis: Doing well after multiple PIP injections at the last visit. Overall multiple NSAIDs, Tylenol, tramadol, has been ineffective. He is tried topical capsaicin and Voltaren without any improvement.  Vestibular migraines: Resolved with discontinuation of Tylenol. He did touch base with neurology. Also was unable to tolerate the Topamax.  Past medical history, Surgical history, Family history not pertinant except as noted below, Social history, Allergies, and medications have been entered into the medical record, reviewed, and no changes needed.   Review of Systems: No fevers, chills, night sweats, weight loss, chest pain, or shortness of breath.   Objective:    General: Well Developed, well nourished, and in no acute distress.  Neuro: Alert and oriented x3, extra-ocular muscles intact, sensation grossly intact.  HEENT: Normocephalic, atraumatic, pupils equal round reactive to light, neck supple, no masses, no lymphadenopathy, thyroid nonpalpable.  Skin: Warm and dry, no rashes. Cardiac: Regular rate and rhythm, no murmurs rubs or gallops, no lower extremity edema.  Respiratory: Clear to auscultation bilaterally. Not using accessory muscles, speaking in full sentences.  Impression and Recommendations:    I spent 25 minutes with this patient, greater than 50% was face-to-face time counseling regarding the above diagnoses

## 2015-05-02 NOTE — Assessment & Plan Note (Signed)
Did well with injections. We are also going to add prednisone 5 mg daily, he understands the risks. Checking a baseline bone densitometry, and then every 6-12 months afterwards. We have tried multiple NSAIDs, acetaminophen, tramadol without any improvement.

## 2015-05-09 ENCOUNTER — Ambulatory Visit (INDEPENDENT_AMBULATORY_CARE_PROVIDER_SITE_OTHER): Payer: 59

## 2015-05-09 DIAGNOSIS — M8588 Other specified disorders of bone density and structure, other site: Secondary | ICD-10-CM | POA: Diagnosis not present

## 2015-05-10 ENCOUNTER — Encounter: Payer: Self-pay | Admitting: Sports Medicine

## 2015-05-10 DIAGNOSIS — M8580 Other specified disorders of bone density and structure, unspecified site: Secondary | ICD-10-CM | POA: Insufficient documentation

## 2015-06-21 ENCOUNTER — Encounter: Payer: Self-pay | Admitting: Sports Medicine

## 2015-06-21 ENCOUNTER — Telehealth: Payer: Self-pay | Admitting: *Deleted

## 2015-06-21 MED ORDER — FLAVOCOXID 500 MG PO CAPS: 1.0000 | ORAL_CAPSULE | Freq: Two times a day (BID) | ORAL | Status: DC

## 2015-06-21 NOTE — Telephone Encounter (Signed)
PA initiated through covermymeds for Limbrel

## 2015-06-22 MED ORDER — FLAVOCOXID 500 MG PO CAPS: 1.0000 | ORAL_CAPSULE | Freq: Two times a day (BID) | ORAL | Status: DC

## 2015-06-22 NOTE — Addendum Note (Signed)
Addended by: Silverio Decamp on: 06/22/2015 08:24 AM   Modules accepted: Orders

## 2015-06-22 NOTE — Telephone Encounter (Signed)
Please call the patient and discuss with him, he told me it would be only $40 if it was sent to the pharmacy he recommended. There is a Therapist, music

## 2015-06-22 NOTE — Telephone Encounter (Signed)
Response from [facility] is that this medication has a policy exclusion, so there is no coverage criteria to review and apply

## 2015-06-22 NOTE — Telephone Encounter (Signed)
Contacted patient and he is aware that insurance will not cover this med. He was hoping PA would help. Pt states he can pay [facility] and the medication will be $49.

## 2015-06-29 ENCOUNTER — Ambulatory Visit (INDEPENDENT_AMBULATORY_CARE_PROVIDER_SITE_OTHER): Payer: 59 | Admitting: Sports Medicine

## 2015-06-29 ENCOUNTER — Encounter: Payer: Self-pay | Admitting: Sports Medicine

## 2015-06-29 VITALS — BP 112/57 | HR 76 | Resp 16 | Ht 70.0 in | Wt 186.5 lb

## 2015-06-29 DIAGNOSIS — Z0000 Encounter for general adult medical examination without abnormal findings: Secondary | ICD-10-CM

## 2015-06-29 DIAGNOSIS — Z23 Encounter for immunization: Secondary | ICD-10-CM

## 2015-06-29 DIAGNOSIS — M159 Polyosteoarthritis, unspecified: Secondary | ICD-10-CM

## 2015-06-29 DIAGNOSIS — Z299 Encounter for prophylactic measures, unspecified: Secondary | ICD-10-CM | POA: Diagnosis not present

## 2015-06-29 DIAGNOSIS — E785 Hyperlipidemia, unspecified: Secondary | ICD-10-CM | POA: Diagnosis not present

## 2015-06-29 NOTE — Assessment & Plan Note (Signed)
Minimal improvement on prednisone 5 mg daily.he is still waiting for his new medication.

## 2015-06-29 NOTE — Assessment & Plan Note (Signed)
Annual physical as above, Zostavax. Routine blood work.

## 2015-06-29 NOTE — Progress Notes (Signed)
  Subjective:    CC: physical exam  HPI:  Preventive measures: Physical today.  Generalized osteoarthritis: After failure of multiple medications, we have minimal success with daily prednisone. He is still waiting on his new medication. Flavocoxid.  Vertigo: Has had few episodes, does take meclizine when he gets the episodes and they last a couple of hours, he has not yet tried Valium  Past medical history, Surgical history, Family history not pertinant except as noted below, Social history, Allergies, and medications have been entered into the medical record, reviewed, and no changes needed.   Review of Systems: No headache, visual changes, nausea, vomiting, diarrhea, constipation, dizziness, abdominal pain, skin rash, fevers, chills, night sweats, swollen lymph nodes, weight loss, chest pain, body aches, joint swelling, muscle aches, shortness of breath, mood changes, visual or auditory hallucinations.  Objective:    General: Well Developed, well nourished, and in no acute distress.  Neuro: Alert and oriented x3, extra-ocular muscles intact, sensation grossly intact. Cranial nerves II through XII are intact, motor, sensory, and coordinative functions are all intact. HEENT: Normocephalic, atraumatic, pupils equal round reactive to light, neck supple, no masses, no lymphadenopathy, thyroid nonpalpable. Oropharynx, nasopharynx, external ear canals are unremarkable. Skin: Warm and dry, no rashes noted.  Cardiac: Regular rate and rhythm, no murmurs rubs or gallops.  Respiratory: Clear to auscultation bilaterally. Not using accessory muscles, speaking in full sentences.  Abdominal: Soft, nontender, nondistended, positive bowel sounds, no masses, no organomegaly.  Musculoskeletal: Shoulder, elbow, wrist, hip, knee, ankle stable, and with full range of motion.  Impression and Recommendations:    The patient was counselled, risk factors were discussed, anticipatory guidance given.

## 2015-07-02 ENCOUNTER — Encounter: Payer: Self-pay | Admitting: Sports Medicine

## 2015-07-02 LAB — CBC
HCT: 40.6 % (ref 38.5–50.0)
Hemoglobin: 13.4 g/dL (ref 13.2–17.1)
MCH: 29 pg (ref 27.0–33.0)
MCHC: 33 g/dL (ref 32.0–36.0)
MCV: 87.9 fL (ref 80.0–100.0)
MPV: 9.1 fL (ref 7.5–12.5)
Platelets: 202 K/uL (ref 140–400)
RBC: 4.62 MIL/uL (ref 4.20–5.80)
RDW: 13.9 % (ref 11.0–15.0)
WBC: 5.4 10*3/uL (ref 3.8–10.8)

## 2015-07-02 LAB — HEMOGLOBIN A1C
Hgb A1c MFr Bld: 5.5 % (ref <5.7)
Mean Plasma Glucose: 111 mg/dL

## 2015-07-03 LAB — HIV ANTIBODY (ROUTINE TESTING W REFLEX): HIV 1&2 Ab, 4th Generation: NONREACTIVE

## 2015-07-03 LAB — COMPREHENSIVE METABOLIC PANEL WITH GFR
ALT: 15 U/L (ref 9–46)
AST: 15 U/L (ref 10–35)
Alkaline Phosphatase: 54 U/L (ref 40–115)
BUN: 22 mg/dL (ref 7–25)
CO2: 24 mmol/L (ref 20–31)
Glucose, Bld: 88 mg/dL (ref 65–99)
Potassium: 4 mmol/L (ref 3.5–5.3)

## 2015-07-03 LAB — LIPID PANEL
Cholesterol: 214 mg/dL — ABNORMAL HIGH (ref 125–200)
HDL: 39 mg/dL — ABNORMAL LOW (ref >=40)
LDL Cholesterol: 151 mg/dL — ABNORMAL HIGH (ref <130)
Total CHOL/HDL Ratio: 5.5 ratio — ABNORMAL HIGH (ref <=5.0)
Triglycerides: 118 mg/dL (ref <150)
VLDL: 24 mg/dL (ref <30)

## 2015-07-03 LAB — COMPREHENSIVE METABOLIC PANEL
Albumin: 3.9 g/dL (ref 3.6–5.1)
Calcium: 9 mg/dL (ref 8.6–10.3)
Chloride: 105 mmol/L (ref 98–110)
Creat: 0.92 mg/dL (ref 0.70–1.25)
Sodium: 139 mmol/L (ref 135–146)
Total Bilirubin: 0.4 mg/dL (ref 0.2–1.2)
Total Protein: 6.3 g/dL (ref 6.1–8.1)

## 2015-07-03 LAB — VITAMIN D 25 HYDROXY (VIT D DEFICIENCY, FRACTURES): Vit D, 25-Hydroxy: 28 ng/mL — ABNORMAL LOW (ref 30–100)

## 2015-07-03 LAB — HEPATITIS C ANTIBODY: HCV Ab: NEGATIVE

## 2015-07-03 LAB — VITAMIN B12: Vitamin B-12: 412 pg/mL (ref 200–1100)

## 2015-07-03 LAB — TSH: TSH: 0.99 mIU/L (ref 0.40–4.50)

## 2015-07-03 MED ORDER — VITAMIN D (ERGOCALCIFEROL) 1.25 MG (50000 UNIT) PO CAPS: 50000.0000 [IU] | ORAL_CAPSULE | ORAL | Status: DC

## 2015-07-03 MED ORDER — ATORVASTATIN CALCIUM 40 MG PO TABS: 40.0000 mg | ORAL_TABLET | Freq: Every day | ORAL | Status: DC

## 2015-07-03 NOTE — Addendum Note (Signed)
Addended by: Silverio Decamp on: 07/03/2015 11:38 AM   Modules accepted: Orders

## 2015-07-03 NOTE — Addendum Note (Signed)
Addended by: Silverio Decamp on: 07/03/2015 08:38 AM   Modules accepted: Orders

## 2015-07-03 NOTE — Assessment & Plan Note (Signed)
Started Lipitor 40, recheck in 3 months

## 2015-07-17 ENCOUNTER — Encounter: Payer: Self-pay | Admitting: Sports Medicine

## 2015-07-17 ENCOUNTER — Telehealth: Payer: Self-pay

## 2015-07-17 NOTE — Telephone Encounter (Signed)
Have him come see me tomorrow.

## 2015-07-17 NOTE — Telephone Encounter (Signed)
Pt has been taking Limbrel 500mg  for 2 wks and since Sunday he's been having nausea, fatigue, dizziness, loss of appetite, skin is "hot", vomited "clear and yellow stuff". Wife states pt has been urinating. Please advise.

## 2015-07-17 NOTE — Telephone Encounter (Signed)
Pt recently ate toast and is feeling a little better will call if feels he still needs appointment.

## 2015-07-19 ENCOUNTER — Encounter: Payer: Self-pay | Admitting: Sports Medicine

## 2015-07-19 ENCOUNTER — Ambulatory Visit (INDEPENDENT_AMBULATORY_CARE_PROVIDER_SITE_OTHER): Payer: 59 | Admitting: Sports Medicine

## 2015-07-19 VITALS — BP 125/75 | HR 68 | Temp 100.0°F | Resp 18 | Wt 181.6 lb

## 2015-07-19 DIAGNOSIS — F411 Generalized anxiety disorder: Secondary | ICD-10-CM | POA: Diagnosis not present

## 2015-07-19 DIAGNOSIS — F419 Anxiety disorder, unspecified: Secondary | ICD-10-CM

## 2015-07-19 DIAGNOSIS — M159 Polyosteoarthritis, unspecified: Secondary | ICD-10-CM | POA: Diagnosis not present

## 2015-07-19 DIAGNOSIS — F329 Major depressive disorder, single episode, unspecified: Secondary | ICD-10-CM | POA: Insufficient documentation

## 2015-07-19 DIAGNOSIS — F32A Depression, unspecified: Secondary | ICD-10-CM | POA: Insufficient documentation

## 2015-07-19 MED ORDER — DULOXETINE HCL 20 MG PO CPEP: 20.0000 mg | ORAL_CAPSULE | Freq: Every day | ORAL | Status: DC

## 2015-07-19 MED ORDER — ALPRAZOLAM 0.5 MG PO TBDP: 0.5000 mg | ORAL_TABLET | Freq: Two times a day (BID) | ORAL | Status: DC | PRN

## 2015-07-19 NOTE — Progress Notes (Signed)
  Subjective:    CC: Follow-up  HPI: This is a pleasant 60 year old male, he called in with complaints of dizziness, sweaty, nausea. We have previous he diagnosed him with vestibular migraines, ultimately stress test, echocardiogram, MRI brain were negative. On further questioning he endorses some degree of stress and anxiety preceding this episode, at which point he felt presyncopal, nauseated, and had a cold sweat. This resolved quickly and all was normal. Further questioning he does endorse moderate irritability, mild nervousness, difficulty controlling his worry, worrying about different things, and trouble relaxing. Symptoms are moderate, persistent.  Past medical history, Surgical history, Family history not pertinant except as noted below, Social history, Allergies, and medications have been entered into the medical record, reviewed, and no changes needed.   Review of Systems: No fevers, chills, night sweats, weight loss, chest pain, or shortness of breath.   Objective:    General: Well Developed, well nourished, and in no acute distress.  Neuro: Alert and oriented x3, extra-ocular muscles intact, sensation grossly intact.  HEENT: Normocephalic, atraumatic, pupils equal round reactive to light, neck supple, no masses, no lymphadenopathy, thyroid nonpalpable.  Skin: Warm and dry, no rashes. Cardiac: Regular rate and rhythm, no murmurs rubs or gallops, no lower extremity edema.  Respiratory: Clear to auscultation bilaterally. Not using accessory muscles, speaking in full sentences.  Impression and Recommendations:    I spent 25 minutes with this patient, greater than 50% was face-to-face time counseling regarding the above diagnoses

## 2015-07-19 NOTE — Patient Instructions (Signed)
Syncope, commonly known as fainting, is a temporary loss of consciousness. It occurs when the blood flow to the brain is reduced. Vasovagal syncope (also called neurocardiogenic syncope) is a fainting spell in which the blood flow to the brain is reduced because of a sudden drop in heart rate and blood pressure. Vasovagal syncope occurs when the brain and the cardiovascular system (blood vessels) do not adequately communicate and respond to each other. This is the most common cause of fainting. It often occurs in response to fear or some other type of emotional or physical stress. The body has a reaction in which the heart starts beating too slowly or the blood vessels expand, reducing blood pressure. This type of fainting spell is generally considered harmless. However, injuries can occur if a person takes a sudden fall during a fainting spell.   CAUSES   Vasovagal syncope occurs when a person's blood pressure and heart rate decrease suddenly, usually in response to a trigger. Many things and situations can trigger an episode. Some of these include:    Pain.    Fear.    The sight of blood or medical procedures, such as blood being drawn from a vein.    Common activities, such as coughing, swallowing, stretching, or going to the bathroom.    Emotional stress.    Prolonged standing, especially in a warm environment.    Lack of sleep or rest.    Prolonged lack of food.    Prolonged lack of fluids.    Recent illness.   The use of certain drugs that affect blood pressure, such as cocaine, alcohol, marijuana, inhalants, and opiates.   SYMPTOMS   Before the fainting episode, you may:    Feel dizzy or light headed.    Become pale.   Sense that you are going to faint.    Feel like the room is spinning.    Have tunnel vision, only seeing directly in front of you.    Feel sick to your stomach (nauseous).    See spots or slowly lose vision.    Hear ringing in your ears.    Have a headache.     Feel warm and sweaty.    Feel a sensation of pins and needles.  During the fainting spell, you will generally be unconscious for no longer than a couple minutes before waking up and returning to normal. If you get up too quickly before your body can recover, you may faint again. Some twitching or jerky movements may occur during the fainting spell.   DIAGNOSIS   Your health care provider will ask about your symptoms, take a medical history, and perform a physical exam. Various tests may be done to rule out other causes of fainting. These may include blood tests and tests to check the heart, such as electrocardiography, echocardiography, and possibly an electrophysiology study. When other causes have been ruled out, a test may be done to check the body's response to changes in position (tilt table test).  TREATMENT   Most cases of vasovagal syncope do not require treatment. Your health care provider may recommend ways to avoid fainting triggers and may provide home strategies for preventing fainting. If you must be exposed to a possible trigger, you can drink additional fluids to help reduce your chances of having an episode of vasovagal syncope. If you have warning signs of an oncoming episode, you can respond by positioning yourself favorably (lying down).  If your fainting spells continue, you may be   given medicines to prevent fainting. Some medicines may help make you more resistant to repeated episodes of vasovagal syncope. Special exercises or compression stockings may be recommended. In rare cases, the surgical placement of a pacemaker is considered.  HOME CARE INSTRUCTIONS    Learn to identify the warning signs of vasovagal syncope.    Sit or lie down at the first warning sign of a fainting spell. If sitting, put your head down between your legs. If you lie down, swing your legs up in the air to increase blood flow to the brain.    Avoid hot tubs and saunas.   Avoid prolonged standing.   Drink  enough fluids to keep your urine clear or pale yellow. Avoid caffeine.   Increase salt in your diet as directed by your health care provider.    If you have to stand for a long time, perform movements such as:     Crossing your legs.     Flexing and stretching your leg muscles.     Squatting.     Moving your legs.     Bending over.    Only take over-the-counter or prescription medicines as directed by your health care provider. Do not suddenly stop any medicines without asking your health care provider first.  SEEK MEDICAL CARE IF:    Your fainting spells continue or happen more frequently in spite of treatment.    You lose consciousness for more than a couple minutes.   You have fainting spells during or after exercising or after being startled.    You have new symptoms that occur with the fainting spells, such as:     Shortness of breath.    Chest pain.     Irregular heartbeat.    You have episodes of twitching or jerky movements that last longer than a few seconds.   You have episodes of twitching or jerky movements without obvious fainting.  SEEK IMMEDIATE MEDICAL CARE IF:    You have injuries or bleeding after a fainting spell.    You have episodes of twitching or jerky movements that last longer than 5 minutes.    You have more than one spell of twitching or jerky movements before returning to consciousness after fainting.     This information is not intended to replace advice given to you by your health care provider. Make sure you discuss any questions you have with your health care provider.     Document Released: 02/04/2012 Document Revised: 07/04/2014 Document Reviewed: 02/04/2012  [employer] Interactive Patient Education 2016 [employer]

## 2015-07-19 NOTE — Assessment & Plan Note (Signed)
There are reports of vasovagal type symptoms, he does endorse a degree of baseline anxiety. Starting Cymbalta, alprazolam for rate through anxiety.

## 2015-08-08 ENCOUNTER — Encounter: Payer: Self-pay | Admitting: Sports Medicine

## 2015-08-08 ENCOUNTER — Ambulatory Visit (INDEPENDENT_AMBULATORY_CARE_PROVIDER_SITE_OTHER): Payer: Managed Care, Other (non HMO) | Admitting: Sports Medicine

## 2015-08-08 VITALS — BP 122/70 | HR 65 | Resp 16 | Wt 183.0 lb

## 2015-08-08 DIAGNOSIS — M159 Polyosteoarthritis, unspecified: Secondary | ICD-10-CM | POA: Diagnosis not present

## 2015-08-08 NOTE — Progress Notes (Signed)
Subjective:    CC: Follow-up  HPI: Generalized hand osteoarthritis: We have tried nearly every NSAID, analgesic, prednisone. Unfortunately continued to have pain and we have been keeping him going with occasional interphalangeal joint injections. He desires injections into 9 joints today. Pain is moderate, persistent without radiation. Agreeable to touch base with hand surgery.  Past medical history, Surgical history, Family history not pertinant except as noted below, Social history, Allergies, and medications have been entered into the medical record, reviewed, and no changes needed.   Review of Systems: No fevers, chills, night sweats, weight loss, chest pain, or shortness of breath.   Objective:    General: Well Developed, well nourished, and in no acute distress.  Neuro: Alert and oriented x3, extra-ocular muscles intact, sensation grossly intact.  HEENT: Normocephalic, atraumatic, pupils equal round reactive to light, neck supple, no masses, no lymphadenopathy, thyroid nonpalpable.  Skin: Warm and dry, no rashes. Cardiac: Regular rate and rhythm, no murmurs rubs or gallops, no lower extremity edema.  Respiratory: Clear to auscultation bilaterally. Not using accessory muscles, speaking in full sentences.  Procedure: Real-time Ultrasound Guided Injection of left second PIP Device: GE Logiq E  Verbal informed consent obtained.  Time-out conducted.  Noted no overlying erythema, induration, or other signs of local infection.  Skin prepped in a sterile fashion.  Local anesthesia: Topical Ethyl chloride.  With sterile technique and under real time ultrasound guidance: Using a 30-gauge needle and injected 1/2 mL kenalog 40, 1/2 mL lidocaine Completed without difficulty  Pain immediately resolved suggesting accurate placement of the medication.  Advised to call if fevers/chills, erythema, induration, drainage, or persistent bleeding.  Images permanently stored and available  for review in the ultrasound unit.  Impression: Technically successful ultrasound guided injection.  Procedure: Real-time Ultrasound Guided Injection of left third PIP Device: GE Logiq E  Verbal informed consent obtained.  Time-out conducted.  Noted no overlying erythema, induration, or other signs of local infection.  Skin prepped in a sterile fashion.  Local anesthesia: Topical Ethyl chloride.  With sterile technique and under real time ultrasound guidance: Using a 30-gauge needle and injected 1/2 mL kenalog 40, 1/2 mL lidocaine Completed without difficulty  Pain immediately resolved suggesting accurate placement of the medication.  Advised to call if fevers/chills, erythema, induration, drainage, or persistent bleeding.  Images permanently stored and available for review in the ultrasound unit.  Impression: Technically successful ultrasound guided injection.  Procedure: Real-time Ultrasound Guided Injection of left fourth PIP Device: GE Logiq E  Verbal informed consent obtained.  Time-out conducted.  Noted no overlying erythema, induration, or other signs of local infection.  Skin prepped in a sterile fashion.  Local anesthesia: Topical Ethyl chloride.  With sterile technique and under real time ultrasound guidance: Using a 30-gauge needle and injected 1/2 mL kenalog 40, 1/2 mL lidocaine Completed without difficulty  Pain immediately resolved suggesting accurate placement of the medication.  Advised to call if fevers/chills, erythema, induration, drainage, or persistent bleeding.  Images permanently stored and available for review in the ultrasound unit.  Impression: Technically successful ultrasound guided injection.  Procedure: Real-time Ultrasound Guided Injection of left fifth PIP Device: GE Logiq E  Verbal informed consent obtained.  Time-out conducted.  Noted no overlying erythema, induration, or other signs of local infection.  Skin prepped in a  sterile fashion.  Local anesthesia: Topical Ethyl chloride.  With sterile technique and under real time ultrasound guidance: Using a 30-gauge needle and injected 1/2 mL kenalog 40, 1/2 mL lidocaine  Completed without difficulty  Pain immediately resolved suggesting accurate placement of the medication.  Advised to call if fevers/chills, erythema, induration, drainage, or persistent bleeding.  Images permanently stored and available for review in the ultrasound unit.  Impression: Technically successful ultrasound guided injection.  Procedure: Real-time Ultrasound Guided Injection of right second PIP Device: GE Logiq E  Verbal informed consent obtained.  Time-out conducted.  Noted no overlying erythema, induration, or other signs of local infection.  Skin prepped in a sterile fashion.  Local anesthesia: Topical Ethyl chloride.  With sterile technique and under real time ultrasound guidance: Using a 30-gauge needle and injected 1/2 mL kenalog 40, 1/2 mL lidocaine Completed without difficulty  Pain immediately resolved suggesting accurate placement of the medication.  Advised to call if fevers/chills, erythema, induration, drainage, or persistent bleeding.  Images permanently stored and available for review in the ultrasound unit.  Impression: Technically successful ultrasound guided injection.  Procedure: Real-time Ultrasound Guided Injection of right third PIP Device: GE Logiq E  Verbal informed consent obtained.  Time-out conducted.  Noted no overlying erythema, induration, or other signs of local infection.  Skin prepped in a sterile fashion.  Local anesthesia: Topical Ethyl chloride.  With sterile technique and under real time ultrasound guidance: Using a 30-gauge needle and injected 1/2 mL kenalog 40, 1/2 mL lidocaine Completed without difficulty  Pain immediately resolved suggesting accurate placement of the medication.  Advised to call if fevers/chills,  erythema, induration, drainage, or persistent bleeding.  Images permanently stored and available for review in the ultrasound unit.  Impression: Technically successful ultrasound guided injection.  Procedure: Real-time Ultrasound Guided Injection of right fourth PIP Device: GE Logiq E  Verbal informed consent obtained.  Time-out conducted.  Noted no overlying erythema, induration, or other signs of local infection.  Skin prepped in a sterile fashion.  Local anesthesia: Topical Ethyl chloride.  With sterile technique and under real time ultrasound guidance: Using a 30-gauge needle and injected 1/2 mL kenalog 40, 1/2 mL lidocaine Completed without difficulty  Pain immediately resolved suggesting accurate placement of the medication.  Advised to call if fevers/chills, erythema, induration, drainage, or persistent bleeding.  Images permanently stored and available for review in the ultrasound unit.  Impression: Technically successful ultrasound guided injection.  Procedure: Real-time Ultrasound Guided Injection of right fifth PIP Device: GE Logiq E  Verbal informed consent obtained.  Time-out conducted.  Noted no overlying erythema, induration, or other signs of local infection.  Skin prepped in a sterile fashion.  Local anesthesia: Topical Ethyl chloride.  With sterile technique and under real time ultrasound guidance: Using a 30-gauge needle and injected 1/2 mL kenalog 40, 1/2 mL lidocaine Completed without difficulty  Pain immediately resolved suggesting accurate placement of the medication.  Advised to call if fevers/chills, erythema, induration, drainage, or persistent bleeding.  Images permanently stored and available for review in the ultrasound unit.  Impression: Technically successful ultrasound guided injection.  Procedure: Real-time Ultrasound Guided Injection of right first interphalangeal joint Device: GE Logiq E  Verbal informed consent obtained.   Time-out conducted.  Noted no overlying erythema, induration, or other signs of local infection.  Skin prepped in a sterile fashion.  Local anesthesia: Topical Ethyl chloride.  With sterile technique and under real time ultrasound guidance: Using a 30-gauge needle and injected 1/2 mL kenalog 40, 1/2 mL lidocaine Completed without difficulty  Pain immediately resolved suggesting accurate placement of the medication.  Advised to call if fevers/chills, erythema, induration, drainage, or persistent bleeding.  Images  permanently stored and available for review in the ultrasound unit.  Impression: Technically successful ultrasound guided injection.  Impression and Recommendations:

## 2015-08-08 NOTE — Assessment & Plan Note (Addendum)
Right second through fifth proximal interphalangeal joint injections as well as right first interphalangeal joint, also left second through fifth proximal interphalangeal joint injections. Not lasting for a long, we've tried most NSAIDs, prednisone, and he has a completely negative rheumatoid workup. Referral to hand surgery for further assistance in management.

## 2015-08-16 ENCOUNTER — Ambulatory Visit: Payer: 59 | Admitting: Sports Medicine

## 2015-09-09 ENCOUNTER — Encounter: Payer: Self-pay | Admitting: Sports Medicine

## 2015-09-19 ENCOUNTER — Other Ambulatory Visit: Payer: Self-pay

## 2015-09-19 ENCOUNTER — Encounter: Payer: Self-pay | Admitting: Sports Medicine

## 2015-09-19 DIAGNOSIS — M159 Polyosteoarthritis, unspecified: Secondary | ICD-10-CM

## 2015-09-19 MED ORDER — OMEPRAZOLE 40 MG PO CPDR: 40.0000 mg | DELAYED_RELEASE_CAPSULE | Freq: Every morning | ORAL | Status: DC

## 2015-09-19 MED ORDER — PREDNISONE 5 MG PO TABS: 5.0000 mg | ORAL_TABLET | Freq: Every day | ORAL | Status: DC

## 2015-11-06 ENCOUNTER — Encounter: Payer: Self-pay | Admitting: Sports Medicine

## 2015-12-03 ENCOUNTER — Ambulatory Visit (INDEPENDENT_AMBULATORY_CARE_PROVIDER_SITE_OTHER): Payer: Managed Care, Other (non HMO) | Admitting: Sports Medicine

## 2015-12-03 ENCOUNTER — Encounter: Payer: Self-pay | Admitting: Sports Medicine

## 2015-12-03 DIAGNOSIS — Z23 Encounter for immunization: Secondary | ICD-10-CM

## 2015-12-03 DIAGNOSIS — M159 Polyosteoarthritis, unspecified: Secondary | ICD-10-CM

## 2015-12-03 NOTE — Progress Notes (Signed)
Subjective:    CC: Bilateral hand pain  HPI: This is a pleasant 60 year old male, he comes in with recurrence of bilateral hand pain, we have been giving him occasional interphalangeal joint injections, he does have recurrence of pain. He did see his hand surgeon who didn't recommend any surgical intervention, simply continuing with injections. Currently his pain is in all 4 proximal interphalangeal joints and his left and right first interphalangeal joints.  Past medical history:  Negative.  See flowsheet/record as well for more information.  Surgical history: Negative.  See flowsheet/record as well for more information.  Family history: Negative.  See flowsheet/record as well for more information.  Social history: Negative.  See flowsheet/record as well for more information.  Allergies, and medications have been entered into the medical record, reviewed, and no changes needed.   Review of Systems: No fevers, chills, night sweats, weight loss, chest pain, or shortness of breath.   Objective:    General: Well Developed, well nourished, and in no acute distress.  Neuro: Alert and oriented x3, extra-ocular muscles intact, sensation grossly intact.  HEENT: Normocephalic, atraumatic, pupils equal round reactive to light, neck supple, no masses, no lymphadenopathy, thyroid nonpalpable.  Skin: Warm and dry, no rashes. Cardiac: Regular rate and rhythm, no murmurs rubs or gallops, no lower extremity edema.  Respiratory: Clear to auscultation bilaterally. Not using accessory muscles, speaking in full sentences.  A total of 8 mL lidocaine and 2 mL Kenalog 40 was injected and spread out between the below joints.  Procedure: Real-time Ultrasound Guided Injection of left second proximal interphalangeal joint Device: GE Logiq E  Verbal informed consent obtained.  Time-out conducted.  Noted no overlying erythema, induration, or other signs of local infection.  Skin prepped in a sterile fashion.    Local anesthesia: Topical Ethyl chloride.  With sterile technique and under real time ultrasound guidance:  Medication injected easily. Completed without difficulty  Pain immediately resolved suggesting accurate placement of the medication.  Advised to call if fevers/chills, erythema, induration, drainage, or persistent bleeding.  Images permanently stored and available for review in the ultrasound unit.  Impression: Technically successful ultrasound guided injection.  Procedure: Real-time Ultrasound Guided Injection of left third proximal interphalangeal joint Device: GE Logiq E  Verbal informed consent obtained.  Time-out conducted.  Noted no overlying erythema, induration, or other signs of local infection.  Skin prepped in a sterile fashion.  Local anesthesia: Topical Ethyl chloride.  With sterile technique and under real time ultrasound guidance:  Medication injected easily. Completed without difficulty  Pain immediately resolved suggesting accurate placement of the medication.  Advised to call if fevers/chills, erythema, induration, drainage, or persistent bleeding.  Images permanently stored and available for review in the ultrasound unit.  Impression: Technically successful ultrasound guided injection.  Procedure: Real-time Ultrasound Guided Injection of left fourth proximal interphalangeal joint Device: GE Logiq E  Verbal informed consent obtained.  Time-out conducted.  Noted no overlying erythema, induration, or other signs of local infection.  Skin prepped in a sterile fashion.  Local anesthesia: Topical Ethyl chloride.  With sterile technique and under real time ultrasound guidance:  Medication injected easily. Completed without difficulty  Pain immediately resolved suggesting accurate placement of the medication.  Advised to call if fevers/chills, erythema, induration, drainage, or persistent bleeding.  Images permanently stored and available for review in the  ultrasound unit.  Impression: Technically successful ultrasound guided injection.  Procedure: Real-time Ultrasound Guided Injection of left fifth proximal interphalangeal joint Device: GE Logiq E  Verbal informed consent obtained.  Time-out conducted.  Noted no overlying erythema, induration, or other signs of local infection.  Skin prepped in a sterile fashion.  Local anesthesia: Topical Ethyl chloride.  With sterile technique and under real time ultrasound guidance:  Medication injected easily. Completed without difficulty  Pain immediately resolved suggesting accurate placement of the medication.  Advised to call if fevers/chills, erythema, induration, drainage, or persistent bleeding.  Images permanently stored and available for review in the ultrasound unit.  Impression: Technically successful ultrasound guided injection.  Procedure: Real-time Ultrasound Guided Injection of left first interphalangeal joint Device: GE Logiq E  Verbal informed consent obtained.  Time-out conducted.  Noted no overlying erythema, induration, or other signs of local infection.  Skin prepped in a sterile fashion.  Local anesthesia: Topical Ethyl chloride.  With sterile technique and under real time ultrasound guidance:  Medication injected easily. Completed without difficulty  Pain immediately resolved suggesting accurate placement of the medication.  Advised to call if fevers/chills, erythema, induration, drainage, or persistent bleeding.  Images permanently stored and available for review in the ultrasound unit.  Impression: Technically successful ultrasound guided injection.  Procedure: Real-time Ultrasound Guided Injection of right second proximal interphalangeal joint Device: GE Logiq E  Verbal informed consent obtained.  Time-out conducted.  Noted no overlying erythema, induration, or other signs of local infection.  Skin prepped in a sterile fashion.  Local anesthesia: Topical Ethyl  chloride.  With sterile technique and under real time ultrasound guidance:  Medication injected easily. Completed without difficulty  Pain immediately resolved suggesting accurate placement of the medication.  Advised to call if fevers/chills, erythema, induration, drainage, or persistent bleeding.  Images permanently stored and available for review in the ultrasound unit.  Impression: Technically successful ultrasound guided injection.  Procedure: Real-time Ultrasound Guided Injection of right third proximal interphalangeal joint Device: GE Logiq E  Verbal informed consent obtained.  Time-out conducted.  Noted no overlying erythema, induration, or other signs of local infection.  Skin prepped in a sterile fashion.  Local anesthesia: Topical Ethyl chloride.  With sterile technique and under real time ultrasound guidance:  Medication injected easily. Completed without difficulty  Pain immediately resolved suggesting accurate placement of the medication.  Advised to call if fevers/chills, erythema, induration, drainage, or persistent bleeding.  Images permanently stored and available for review in the ultrasound unit.  Impression: Technically successful ultrasound guided injection.  Procedure: Real-time Ultrasound Guided Injection of right fourth proximal interphalangeal joint Device: GE Logiq E  Verbal informed consent obtained.  Time-out conducted.  Noted no overlying erythema, induration, or other signs of local infection.  Skin prepped in a sterile fashion.  Local anesthesia: Topical Ethyl chloride.  With sterile technique and under real time ultrasound guidance:  Medication injected easily. Completed without difficulty  Pain immediately resolved suggesting accurate placement of the medication.  Advised to call if fevers/chills, erythema, induration, drainage, or persistent bleeding.  Images permanently stored and available for review in the ultrasound unit.  Impression:  Technically successful ultrasound guided injection.  Procedure: Real-time Ultrasound Guided Injection of right fifth proximal interphalangeal joint Device: GE Logiq E  Verbal informed consent obtained.  Time-out conducted.  Noted no overlying erythema, induration, or other signs of local infection.  Skin prepped in a sterile fashion.  Local anesthesia: Topical Ethyl chloride.  With sterile technique and under real time ultrasound guidance:  Medication injected easily. Completed without difficulty  Pain immediately resolved suggesting accurate placement of the medication.  Advised to call if  fevers/chills, erythema, induration, drainage, or persistent bleeding.  Images permanently stored and available for review in the ultrasound unit.  Impression: Technically successful ultrasound guided injection.  Procedure: Real-time Ultrasound Guided Injection of right first interphalangeal joint Device: GE Logiq E  Verbal informed consent obtained.  Time-out conducted.  Noted no overlying erythema, induration, or other signs of local infection.  Skin prepped in a sterile fashion.  Local anesthesia: Topical Ethyl chloride.  With sterile technique and under real time ultrasound guidance:  Medication injected easily. Completed without difficulty  Pain immediately resolved suggesting accurate placement of the medication.  Advised to call if fevers/chills, erythema, induration, drainage, or persistent bleeding.  Images permanently stored and available for review in the ultrasound unit.  Impression: Technically successful ultrasound guided injection.  Impression and Recommendations:    Generalized osteoarthritis of hand Repeat injections. This time in all 4 PIPs and into the interphalangeal joints of both thumbs. Return to see me as needed. He did see the hand surgeon who recommended continued conservative treatment.

## 2015-12-03 NOTE — Assessment & Plan Note (Signed)
Repeat injections. This time in all 4 PIPs and into the interphalangeal joints of both thumbs. Return to see me as needed. He did see the hand surgeon who recommended continued conservative treatment.

## 2015-12-05 ENCOUNTER — Encounter: Payer: Self-pay | Admitting: Sports Medicine

## 2015-12-05 MED ORDER — VALACYCLOVIR HCL 1 G PO TABS: 1000.0000 mg | ORAL_TABLET | Freq: Two times a day (BID) | ORAL | 2 refills | Status: AC | PRN

## 2015-12-12 ENCOUNTER — Encounter: Payer: Self-pay | Admitting: Sports Medicine

## 2016-02-01 ENCOUNTER — Encounter: Payer: Self-pay | Admitting: Sports Medicine

## 2016-02-01 ENCOUNTER — Ambulatory Visit (INDEPENDENT_AMBULATORY_CARE_PROVIDER_SITE_OTHER): Payer: Managed Care, Other (non HMO) | Admitting: Sports Medicine

## 2016-02-01 DIAGNOSIS — M25552 Pain in left hip: Secondary | ICD-10-CM

## 2016-02-01 DIAGNOSIS — M159 Polyosteoarthritis, unspecified: Secondary | ICD-10-CM

## 2016-02-01 MED ORDER — PREGABALIN 25 MG PO CAPS: 25.0000 mg | ORAL_CAPSULE | Freq: Two times a day (BID) | ORAL | 3 refills | Status: DC

## 2016-02-01 NOTE — Assessment & Plan Note (Signed)
Consistent with hip flexor strain.  Hip flexor rehabilitation exercises given, I was really unable to reproduce pain on examination today.

## 2016-02-01 NOTE — Assessment & Plan Note (Signed)
All 4 PIPs and thumb IP joints were injected 2 months ago, having recurrence of pain in the left hand. Hand surgeon recommended nonoperative measures. All NSAIDs as well as tramadol have been ineffective. Adding Lyrica, he did fail gabapentin.

## 2016-02-01 NOTE — Progress Notes (Signed)
  Subjective:    CC: Follow-up  HPI: Hand osteoarthritis: Having recurrence of pain in his left hand. Moderate, persistent.  Left hip pain: Localized just proximal to the ASIS. Moderate, persistent, worse with hip flexion, present for a month now, has not done any rehabilitation exercises, no trauma, no radiation.  Vertigo: Having a recurrence of vertiginous symptoms, agrees to restart vestibular rehabilitation.  Past medical history:  Negative.  See flowsheet/record as well for more information.  Surgical history: Negative.  See flowsheet/record as well for more information.  Family history: Negative.  See flowsheet/record as well for more information.  Social history: Negative.  See flowsheet/record as well for more information.  Allergies, and medications have been entered into the medical record, reviewed, and no changes needed.   Review of Systems: No fevers, chills, night sweats, weight loss, chest pain, or shortness of breath.   Objective:    General: Well Developed, well nourished, and in no acute distress.  Neuro: Alert and oriented x3, extra-ocular muscles intact, sensation grossly intact.  HEENT: Normocephalic, atraumatic, pupils equal round reactive to light, neck supple, no masses, no lymphadenopathy, thyroid nonpalpable.  Skin: Warm and dry, no rashes. Cardiac: Regular rate and rhythm, no murmurs rubs or gallops, no lower extremity edema.  Respiratory: Clear to auscultation bilaterally. Not using accessory muscles, speaking in full sentences. Left Hip: ROM IR: 60 Deg, ER: 60 Deg, Flexion: 120 Deg, Extension: 100 Deg, Abduction: 45 Deg, Adduction: 45 Deg Strength IR: 5/5, ER: 5/5, Flexion: 5/5, Extension: 5/5, Abduction: 5/5, Adduction: 5/5 Pelvic alignment unremarkable to inspection and palpation. Standing hip rotation and gait without trendelenburg / unsteadiness. Greater trochanter without tenderness to palpation. No tenderness over piriformis. No SI joint  tenderness and normal minimal SI movement. Difficult to reproduce pain but there is a partial reproduction with resisted flexion of the hip.  Impression and Recommendations:    Generalized osteoarthritis of hand All 4 PIPs and thumb IP joints were injected 2 months ago, having recurrence of pain in the left hand. Hand surgeon recommended nonoperative measures. All NSAIDs as well as tramadol have been ineffective. Adding Lyrica, he did fail gabapentin.  Left hip pain Consistent with hip flexor strain.  Hip flexor rehabilitation exercises given, I was really unable to reproduce pain on examination today.

## 2016-02-08 ENCOUNTER — Encounter: Payer: Self-pay | Admitting: Sports Medicine

## 2016-02-21 ENCOUNTER — Encounter: Payer: Self-pay | Admitting: Sports Medicine

## 2016-02-22 ENCOUNTER — Telehealth: Payer: Self-pay

## 2016-02-22 NOTE — Telephone Encounter (Signed)
Pt left VM asking for an antibiotic for his symptoms. This message was left after pt read email sent recommending theraflu. Please advise.

## 2016-02-29 ENCOUNTER — Ambulatory Visit (INDEPENDENT_AMBULATORY_CARE_PROVIDER_SITE_OTHER): Payer: Managed Care, Other (non HMO) | Admitting: Sports Medicine

## 2016-02-29 ENCOUNTER — Encounter: Payer: Self-pay | Admitting: Sports Medicine

## 2016-02-29 DIAGNOSIS — M159 Polyosteoarthritis, unspecified: Secondary | ICD-10-CM

## 2016-02-29 MED ORDER — HYDROCODONE-IBUPROFEN 5-200 MG PO TABS: 1.0000 | ORAL_TABLET | Freq: Every day | ORAL | 0 refills | Status: DC | PRN

## 2016-02-29 MED ORDER — IBUPROFEN 800 MG PO TABS: 800.0000 mg | ORAL_TABLET | Freq: Three times a day (TID) | ORAL | 2 refills | Status: DC | PRN

## 2016-02-29 NOTE — Assessment & Plan Note (Signed)
Does okay with multiple interphalangeal joint injections, we did it at the beginning of this month. Unfortunately has persistent pain, has discussed this with the hand surgeon who recommended nonoperative measures, and has had multiple negative rheumatoid workup. Most NSAIDs and tramadol have been ineffective, Lyrica, gabapentin, Cymbalta were ineffective. Prednisone is ineffective. Where going to simply try ibuprofen and Vicoprofen for now. He understands the Vicoprofen will not be a regular agent.  I do anticipate proceeding to pain management before too long.

## 2016-02-29 NOTE — Progress Notes (Signed)
  Subjective:    CC: Follow-up  HPI:  This is a pleasant 60 year old male with known inoperable hand osteoarthritis, has failed multiple medications as dictated below in the assessment and plan, at this point he just wants to do ibuprofen.  Past medical history:  Negative.  See flowsheet/record as well for more information.  Surgical history: Negative.  See flowsheet/record as well for more information.  Family history: Negative.  See flowsheet/record as well for more information.  Social history: Negative.  See flowsheet/record as well for more information.  Allergies, and medications have been entered into the medical record, reviewed, and no changes needed.   Review of Systems: No fevers, chills, night sweats, weight loss, chest pain, or shortness of breath.   Objective:    General: Well Developed, well nourished, and in no acute distress.  Neuro: Alert and oriented x3, extra-ocular muscles intact, sensation grossly intact.  HEENT: Normocephalic, atraumatic, pupils equal round reactive to light, neck supple, no masses, no lymphadenopathy, thyroid nonpalpable.  Skin: Warm and dry, no rashes. Cardiac: Regular rate and rhythm, no murmurs rubs or gallops, no lower extremity edema.  Respiratory: Clear to auscultation bilaterally. Not using accessory muscles, speaking in full sentences.  Impression and Recommendations:    Generalized osteoarthritis of hand Does okay with multiple interphalangeal joint injections, we did it at the beginning of this month. Unfortunately has persistent pain, has discussed this with the hand surgeon who recommended nonoperative measures, and has had multiple negative rheumatoid workup. Most NSAIDs and tramadol have been ineffective, Lyrica, gabapentin, Cymbalta were ineffective. Prednisone is ineffective. Where going to simply try ibuprofen and Vicoprofen for now. He understands the Vicoprofen will not be a regular agent.  I do anticipate proceeding to pain  management before too long.  I spent 25 minutes with this patient, greater than 50% was face-to-face time counseling regarding the above diagnoses

## 2016-03-07 ENCOUNTER — Encounter: Payer: Self-pay | Admitting: Sports Medicine

## 2016-03-10 ENCOUNTER — Encounter: Payer: Self-pay | Admitting: Sports Medicine

## 2016-03-10 ENCOUNTER — Ambulatory Visit (INDEPENDENT_AMBULATORY_CARE_PROVIDER_SITE_OTHER): Payer: Managed Care, Other (non HMO)

## 2016-03-10 ENCOUNTER — Ambulatory Visit (INDEPENDENT_AMBULATORY_CARE_PROVIDER_SITE_OTHER): Payer: Managed Care, Other (non HMO) | Admitting: Sports Medicine

## 2016-03-10 DIAGNOSIS — M11849 Other specified crystal arthropathies, unspecified hand: Secondary | ICD-10-CM | POA: Diagnosis not present

## 2016-03-10 DIAGNOSIS — M159 Polyosteoarthritis, unspecified: Secondary | ICD-10-CM

## 2016-03-10 MED ORDER — COLCHICINE 0.6 MG PO TABS: 0.6000 mg | ORAL_TABLET | Freq: Two times a day (BID) | ORAL | 2 refills | Status: DC

## 2016-03-10 NOTE — Progress Notes (Addendum)
Subjective:    CC: Hand pain  HPI: This is a pleasant 61 year old male, we've tried multiple agents to treat his hand osteoarthritis, we've gotten a second opinion from a surgeon, unfortunately he is not an operative candidate, the only thing that really worked so far is individual interphalangeal joint injections, as well as more recently Vicoprofen. Pain is moderate, persistent, localized at the PIPs on digits 2 through 4 on both hands, he desires repeat interventional treatment today.  Past medical history:  Negative.  See flowsheet/record as well for more information.  Surgical history: Negative.  See flowsheet/record as well for more information.  Family history: Negative.  See flowsheet/record as well for more information.  Social history: Negative.  See flowsheet/record as well for more information.  Allergies, and medications have been entered into the medical record, reviewed, and no changes needed.   Review of Systems: No fevers, chills, night sweats, weight loss, chest pain, or shortness of breath.   Objective:    General: Well Developed, well nourished, and in no acute distress.  Neuro: Alert and oriented x3, extra-ocular muscles intact, sensation grossly intact.  HEENT: Normocephalic, atraumatic, pupils equal round reactive to light, neck supple, no masses, no lymphadenopathy, thyroid nonpalpable.  Skin: Warm and dry, no rashes. Cardiac: Regular rate and rhythm, no murmurs rubs or gallops, no lower extremity edema.  Respiratory: Clear to auscultation bilaterally. Not using accessory muscles, speaking in full sentences.  Procedure: Real-time Ultrasound Guided Injection of left second PIP Device: GE Logiq E  Verbal informed consent obtained.  Time-out conducted.  Noted no overlying erythema, induration, or other signs of local infection.  Skin prepped in a sterile fashion.  Local anesthesia: Topical Ethyl chloride.  With sterile technique and under real time ultrasound  guidance:  1/3 mL kenalog 40, 1/3 mL lidocaine, 1/3 mL Marcaine injected easily Completed without difficulty  Pain immediately resolved suggesting accurate placement of the medication.  Advised to call if fevers/chills, erythema, induration, drainage, or persistent bleeding.  Images permanently stored and available for review in the ultrasound unit.  Impression: Technically successful ultrasound guided injection.  Procedure: Real-time Ultrasound Guided Injection of left third PIP Device: GE Logiq E  Verbal informed consent obtained.  Time-out conducted.  Noted no overlying erythema, induration, or other signs of local infection.  Skin prepped in a sterile fashion.  Local anesthesia: Topical Ethyl chloride.  With sterile technique and under real time ultrasound guidance:  1/3 mL kenalog 40, 1/3 mL lidocaine, 1/3 mL Marcaine injected easily Completed without difficulty  Pain immediately resolved suggesting accurate placement of the medication.  Advised to call if fevers/chills, erythema, induration, drainage, or persistent bleeding.  Images permanently stored and available for review in the ultrasound unit.  Impression: Technically successful ultrasound guided injection.  Procedure: Real-time Ultrasound Guided Injection of left fourth PIP Device: GE Logiq E  Verbal informed consent obtained.  Time-out conducted.  Noted no overlying erythema, induration, or other signs of local infection.  Skin prepped in a sterile fashion.  Local anesthesia: Topical Ethyl chloride.  With sterile technique and under real time ultrasound guidance:  1/3 mL kenalog 40, 1/3 mL lidocaine, 1/3 mL Marcaine injected easily Completed without difficulty  Pain immediately resolved suggesting accurate placement of the medication.  Advised to call if fevers/chills, erythema, induration, drainage, or persistent bleeding.  Images permanently stored and available for review in the ultrasound unit.  Impression:  Technically successful ultrasound guided injection.  Procedure: Real-time Ultrasound Guided Injection of right second PIP Device:  GE Logiq E  Verbal informed consent obtained.  Time-out conducted.  Noted no overlying erythema, induration, or other signs of local infection.  Skin prepped in a sterile fashion.  Local anesthesia: Topical Ethyl chloride.  With sterile technique and under real time ultrasound guidance:  1/3 mL kenalog 40, 1/3 mL lidocaine, 1/3 mL Marcaine injected easily Completed without difficulty  Pain immediately resolved suggesting accurate placement of the medication.  Advised to call if fevers/chills, erythema, induration, drainage, or persistent bleeding.  Images permanently stored and available for review in the ultrasound unit.  Impression: Technically successful ultrasound guided injection.  Procedure: Real-time Ultrasound Guided Injection of right third PIP Device: GE Logiq E  Verbal informed consent obtained.  Time-out conducted.  Noted no overlying erythema, induration, or other signs of local infection.  Skin prepped in a sterile fashion.  Local anesthesia: Topical Ethyl chloride.  With sterile technique and under real time ultrasound guidance:  1/3 mL kenalog 40, 1/3 mL lidocaine, 1/3 mL Marcaine injected easily Completed without difficulty  Pain immediately resolved suggesting accurate placement of the medication.  Advised to call if fevers/chills, erythema, induration, drainage, or persistent bleeding.  Images permanently stored and available for review in the ultrasound unit.  Impression: Technically successful ultrasound guided injection.  Procedure: Real-time Ultrasound Guided Injection of right fourth PIP Device: GE Logiq E  Verbal informed consent obtained.  Time-out conducted.  Noted no overlying erythema, induration, or other signs of local infection.  Skin prepped in a sterile fashion.  Local anesthesia: Topical Ethyl chloride.  With sterile  technique and under real time ultrasound guidance:  1/3 mL kenalog 40, 1/3 mL lidocaine, 1/3 mL Marcaine injected easily Completed without difficulty  Pain immediately resolved suggesting accurate placement of the medication.  Advised to call if fevers/chills, erythema, induration, drainage, or persistent bleeding.  Images permanently stored and available for review in the ultrasound unit.  Impression: Technically successful ultrasound guided injection.  Impression and Recommendations:    Calcium pyrophosphate arthropathy of hand Does okay with multiple interphalangeal joint injections, we did it at the beginning of this month. Unfortunately has persistent pain, has discussed this with the hand surgeon who recommended nonoperative measures, and has had multiple negative rheumatoid workup. Most NSAIDs and tramadol have been ineffective, Lyrica, gabapentin, Cymbalta were ineffective. Prednisone is ineffective. Vicoprofen seems to be working ok. He understands the Vicoprofen will not be a regular agent.  I do anticipate proceeding to pain management before too long.  6 interphalangeal joints injected today. Repeat set of x-rays ordered.  The right hand is not any different but the left hand shows new erosions, this is consistent with calcium pyrophosphate deposition disease, and due to failure of multiple other oral agents we are going to start colchicine 0.6 mg twice a day. If this fails we can consider methotrexate. I would want to see him back in one month to evaluate response to colchicine.

## 2016-03-10 NOTE — Addendum Note (Signed)
Addended by: Silverio Decamp on: 03/10/2016 05:13 PM   Modules accepted: Orders

## 2016-03-10 NOTE — Assessment & Plan Note (Addendum)
Does okay with multiple interphalangeal joint injections, we did it at the beginning of this month. Unfortunately has persistent pain, has discussed this with the hand surgeon who recommended nonoperative measures, and has had multiple negative rheumatoid workup. Most NSAIDs and tramadol have been ineffective, Lyrica, gabapentin, Cymbalta were ineffective. Prednisone is ineffective. Vicoprofen seems to be working ok. He understands the Vicoprofen will not be a regular agent.  I do anticipate proceeding to pain management before too long.  6 interphalangeal joints injected today. Repeat set of x-rays ordered.  The right hand is not any different but the left hand shows new erosions, this is consistent with calcium pyrophosphate deposition disease, and due to failure of multiple other oral agents we are going to start colchicine 0.6 mg twice a day. If this fails we can consider methotrexate. I would want to see him back in one month to evaluate response to colchicine.

## 2016-03-11 ENCOUNTER — Encounter: Payer: Self-pay | Admitting: Sports Medicine

## 2016-04-03 ENCOUNTER — Encounter: Payer: Self-pay | Admitting: Sports Medicine

## 2016-04-04 MED ORDER — DEXLANSOPRAZOLE 60 MG PO CPDR: 60.0000 mg | DELAYED_RELEASE_CAPSULE | Freq: Every day | ORAL | 11 refills | Status: DC

## 2016-04-24 ENCOUNTER — Encounter: Payer: Self-pay | Admitting: Sports Medicine

## 2016-04-24 ENCOUNTER — Ambulatory Visit (INDEPENDENT_AMBULATORY_CARE_PROVIDER_SITE_OTHER): Payer: Managed Care, Other (non HMO) | Admitting: Sports Medicine

## 2016-04-24 DIAGNOSIS — M11849 Other specified crystal arthropathies, unspecified hand: Secondary | ICD-10-CM | POA: Diagnosis not present

## 2016-04-24 DIAGNOSIS — E7800 Pure hypercholesterolemia, unspecified: Secondary | ICD-10-CM

## 2016-04-24 NOTE — Progress Notes (Signed)
  Subjective:    CC: Follow-up  HPI: This is a pleasant 61 year old male, he has calcium pyrophosphate deposition disease with chronic pain in the hands, overall he had a fantastic improvement with starting colchicine twice a day. He still does need occasional interphalangeal joint injections, today he would like them in the left and right thumb interphalangeal joints.  Hyperlipidemia: Has self discontinued Lipitor due to questions and concerns about breakdown of the muscles, he tells me he really has no symptoms however.  Past medical history:  Negative.  See flowsheet/record as well for more information.  Surgical history: Negative.  See flowsheet/record as well for more information.  Family history: Negative.  See flowsheet/record as well for more information.  Social history: Negative.  See flowsheet/record as well for more information.  Allergies, and medications have been entered into the medical record, reviewed, and no changes needed.   Review of Systems: No fevers, chills, night sweats, weight loss, chest pain, or shortness of breath.   Objective:    General: Well Developed, well nourished, and in no acute distress.  Neuro: Alert and oriented x3, extra-ocular muscles intact, sensation grossly intact.  HEENT: Normocephalic, atraumatic, pupils equal round reactive to light, neck supple, no masses, no lymphadenopathy, thyroid nonpalpable.  Skin: Warm and dry, no rashes. Cardiac: Regular rate and rhythm, no murmurs rubs or gallops, no lower extremity edema.  Respiratory: Clear to auscultation bilaterally. Not using accessory muscles, speaking in full sentences.  Procedure: Real-time Ultrasound Guided Injection of left first interphalangeal joint Device: GE Logiq E  Verbal informed consent obtained.  Time-out conducted.  Noted no overlying erythema, induration, or other signs of local infection.  Skin prepped in a sterile fashion.  Local anesthesia: Topical Ethyl chloride.  With  sterile technique and under real time ultrasound guidance:  1/2 mL kenalog 40, 1/2 mL lidocaine injected easily. Completed without difficulty  Pain immediately resolved suggesting accurate placement of the medication.  Advised to call if fevers/chills, erythema, induration, drainage, or persistent bleeding.  Images permanently stored and available for review in the ultrasound unit.  Impression: Technically successful ultrasound guided injection.  Procedure: Real-time Ultrasound Guided Injection of right first interphalangeal joint Device: GE Logiq E  Verbal informed consent obtained.  Time-out conducted.  Noted no overlying erythema, induration, or other signs of local infection.  Skin prepped in a sterile fashion.  Local anesthesia: Topical Ethyl chloride.  With sterile technique and under real time ultrasound guidance:  1/2 mL kenalog 40, 1/2 mL lidocaine injected easily. Completed without difficulty  Pain immediately resolved suggesting accurate placement of the medication.  Advised to call if fevers/chills, erythema, induration, drainage, or persistent bleeding.  Images permanently stored and available for review in the ultrasound unit.  Impression: Technically successful ultrasound guided injection.  Impression and Recommendations:    Calcium pyrophosphate arthropathy of hand Continues to do extremely well with colchicine twice a day. We did inject his left and right thumb interphalangeal joints today. Continue current plan.   Hyperlipidemia Rechecking lipids, patient has self discontinued atorvastatin. We can find another non-statin antihyperlipidemic in the future if the lipids are still high in 2 months.  I spent 25 minutes with this patient, greater than 50% was face-to-face time counseling regarding the above diagnoses, this was separate from the time spent performing the above injections

## 2016-04-24 NOTE — Assessment & Plan Note (Signed)
Continues to do extremely well with colchicine twice a day. We did inject his left and right thumb interphalangeal joints today. Continue current plan.

## 2016-04-24 NOTE — Assessment & Plan Note (Addendum)
Rechecking lipids, patient has self discontinued atorvastatin. We can find another non-statin antihyperlipidemic in the future if the lipids are still high in 2 months.  Adding Lipitor 40, recheck in 3 months.

## 2016-05-22 ENCOUNTER — Encounter: Payer: Self-pay | Admitting: Sports Medicine

## 2016-05-23 ENCOUNTER — Encounter: Payer: Self-pay | Admitting: Sports Medicine

## 2016-05-23 DIAGNOSIS — H8113 Benign paroxysmal vertigo, bilateral: Secondary | ICD-10-CM

## 2016-05-23 MED ORDER — DIAZEPAM 5 MG PO TABS
5.0000 mg | ORAL_TABLET | Freq: Every day | ORAL | 0 refills | Status: DC
Start: 2016-05-23 — End: 2017-02-25

## 2016-06-05 ENCOUNTER — Encounter: Payer: Self-pay | Admitting: Sports Medicine

## 2016-06-05 DIAGNOSIS — R1013 Epigastric pain: Secondary | ICD-10-CM

## 2016-06-13 LAB — COMPREHENSIVE METABOLIC PANEL
ALT: 33 U/L (ref 9–46)
AST: 23 U/L (ref 10–35)
Alkaline Phosphatase: 64 U/L (ref 40–115)
BUN: 19 mg/dL (ref 7–25)
Calcium: 9.4 mg/dL (ref 8.6–10.3)
Potassium: 4.3 mmol/L (ref 3.5–5.3)
Total Bilirubin: 0.5 mg/dL (ref 0.2–1.2)
Total Protein: 6.7 g/dL (ref 6.1–8.1)

## 2016-06-13 LAB — CBC
HCT: 43.2 % (ref 38.5–50.0)
Hemoglobin: 14.7 g/dL (ref 13.2–17.1)
MCH: 29.7 pg (ref 27.0–33.0)
MCHC: 34 g/dL (ref 32.0–36.0)
MCV: 87.3 fL (ref 80.0–100.0)
MPV: 9 fL (ref 7.5–12.5)
Platelets: 212 K/uL (ref 140–400)
RBC: 4.95 MIL/uL (ref 4.20–5.80)
RDW: 13.9 % (ref 11.0–15.0)
WBC: 5.8 10*3/uL (ref 3.8–10.8)

## 2016-06-13 LAB — COMPREHENSIVE METABOLIC PANEL WITH GFR
Albumin: 4.1 g/dL (ref 3.6–5.1)
CO2: 22 mmol/L (ref 20–31)
Chloride: 106 mmol/L (ref 98–110)
Creat: 1.11 mg/dL (ref 0.70–1.25)
Glucose, Bld: 92 mg/dL (ref 65–99)
Sodium: 141 mmol/L (ref 135–146)

## 2016-06-13 LAB — LIPID PANEL W/REFLEX DIRECT LDL
Cholesterol: 248 mg/dL — ABNORMAL HIGH (ref <200)
HDL: 39 mg/dL — ABNORMAL LOW (ref >40)
LDL-Cholesterol: 180 mg/dL — ABNORMAL HIGH
Non-HDL Cholesterol (Calc): 209 mg/dL — ABNORMAL HIGH (ref <130)
Total CHOL/HDL Ratio: 6.4 Ratio — ABNORMAL HIGH (ref <5.0)
Triglycerides: 144 mg/dL (ref <150)

## 2016-06-14 LAB — HEMOGLOBIN A1C
Hgb A1c MFr Bld: 5.3 % (ref <5.7)
Mean Plasma Glucose: 105 mg/dL

## 2016-06-16 ENCOUNTER — Other Ambulatory Visit: Payer: Self-pay

## 2016-06-16 DIAGNOSIS — M11849 Other specified crystal arthropathies, unspecified hand: Secondary | ICD-10-CM

## 2016-06-16 DIAGNOSIS — M159 Polyosteoarthritis, unspecified: Secondary | ICD-10-CM

## 2016-06-16 MED ORDER — ATORVASTATIN CALCIUM 40 MG PO TABS: 40.0000 mg | ORAL_TABLET | Freq: Every day | ORAL | 3 refills | Status: DC

## 2016-06-16 MED ORDER — COLCHICINE 0.6 MG PO TABS: 0.6000 mg | ORAL_TABLET | Freq: Two times a day (BID) | ORAL | 2 refills | Status: DC

## 2016-06-16 NOTE — Addendum Note (Signed)
Addended by: Silverio Decamp on: 06/16/2016 10:45 AM   Modules accepted: Orders

## 2016-06-17 ENCOUNTER — Other Ambulatory Visit: Payer: Self-pay

## 2016-06-17 DIAGNOSIS — E7800 Pure hypercholesterolemia, unspecified: Secondary | ICD-10-CM

## 2016-06-17 MED ORDER — ATORVASTATIN CALCIUM 40 MG PO TABS: 40.0000 mg | ORAL_TABLET | Freq: Every day | ORAL | 3 refills | Status: DC

## 2016-07-01 ENCOUNTER — Encounter: Payer: Self-pay | Admitting: Sports Medicine

## 2016-07-21 LAB — HM COLONOSCOPY

## 2016-07-24 ENCOUNTER — Ambulatory Visit (INDEPENDENT_AMBULATORY_CARE_PROVIDER_SITE_OTHER): Payer: Managed Care, Other (non HMO) | Admitting: Sports Medicine

## 2016-07-24 ENCOUNTER — Encounter: Payer: Self-pay | Admitting: Sports Medicine

## 2016-07-24 DIAGNOSIS — M11849 Other specified crystal arthropathies, unspecified hand: Secondary | ICD-10-CM

## 2016-07-24 NOTE — Progress Notes (Signed)
Subjective:    CC: Hand pain  HPI: This is a pleasant 61 year old male, recurrence of right hand arthritic pain, initially controlled well with colchicine. At this point he is hurting more in the right thumb interphalangeal joint second and third PIPs. Her, persistent without radiation.  Past medical history:  Negative.  See flowsheet/record as well for more information.  Surgical history: Negative.  See flowsheet/record as well for more information.  Family history: Negative.  See flowsheet/record as well for more information.  Social history: Negative.  See flowsheet/record as well for more information.  Allergies, and medications have been entered into the medical record, reviewed, and no changes needed.   Review of Systems: No fevers, chills, night sweats, weight loss, chest pain, or shortness of breath.   Objective:    General: Well Developed, well nourished, and in no acute distress.  Neuro: Alert and oriented x3, extra-ocular muscles intact, sensation grossly intact.  HEENT: Normocephalic, atraumatic, pupils equal round reactive to light, neck supple, no masses, no lymphadenopathy, thyroid nonpalpable.  Skin: Warm and dry, no rashes. Cardiac: Regular rate and rhythm, no murmurs rubs or gallops, no lower extremity edema.  Respiratory: Clear to auscultation bilaterally. Not using accessory muscles, speaking in full sentences.  Procedure: Real-time Ultrasound Guided Injection of right first interphalangeal joint Device: GE Logiq E  Verbal informed consent obtained.  Time-out conducted.  Noted no overlying erythema, induration, or other signs of local infection.  Skin prepped in a sterile fashion.  Local anesthesia: Topical Ethyl chloride.  With sterile technique and under real time ultrasound guidance:  1/2 mL kenalog 40, 1/2 mL lidocaine injected easily Completed without difficulty  Pain immediately resolved suggesting accurate placement of the medication.  Advised to call if  fevers/chills, erythema, induration, drainage, or persistent bleeding.  Images permanently stored and available for review in the ultrasound unit.  Impression: Technically successful ultrasound guided injection.  Procedure: Real-time Ultrasound Guided Injection of right second PIP Device: GE Logiq E  Verbal informed consent obtained.  Time-out conducted.  Noted no overlying erythema, induration, or other signs of local infection.  Skin prepped in a sterile fashion.  Local anesthesia: Topical Ethyl chloride.  With sterile technique and under real time ultrasound guidance:  1/2 mL kenalog 40, 1/2 mL lidocaine injected easily Completed without difficulty  Pain immediately resolved suggesting accurate placement of the medication.  Advised to call if fevers/chills, erythema, induration, drainage, or persistent bleeding.  Images permanently stored and available for review in the ultrasound unit.  Impression: Technically successful ultrasound guided injection.  Procedure: Real-time Ultrasound Guided Injection of right third PIP Device: GE Logiq E  Verbal informed consent obtained.  Time-out conducted.  Noted no overlying erythema, induration, or other signs of local infection.  Skin prepped in a sterile fashion.  Local anesthesia: Topical Ethyl chloride.  With sterile technique and under real time ultrasound guidance:  1/2 mL kenalog 40, 1/2 mL lidocaine injected easily Completed without difficulty  Pain immediately resolved suggesting accurate placement of the medication.  Advised to call if fevers/chills, erythema, induration, drainage, or persistent bleeding.  Images permanently stored and available for review in the ultrasound unit.  Impression: Technically successful ultrasound guided injection.  Impression and Recommendations:    Calcium pyrophosphate arthropathy of hand Right first interphalangeal joint and second and third PIP injections as above. His insurance company seems to  only cover 3 shots per visit. Continue colchicine. PRP would be another option, he has already discussed this with the hand surgeon.

## 2016-07-24 NOTE — Assessment & Plan Note (Signed)
Right first interphalangeal joint and second and third PIP injections as above. His insurance company seems to only cover 3 shots per visit. Continue colchicine. PRP would be another option, he has already discussed this with the hand surgeon.

## 2016-07-31 ENCOUNTER — Ambulatory Visit (INDEPENDENT_AMBULATORY_CARE_PROVIDER_SITE_OTHER): Payer: Managed Care, Other (non HMO) | Admitting: Sports Medicine

## 2016-07-31 DIAGNOSIS — M11849 Other specified crystal arthropathies, unspecified hand: Secondary | ICD-10-CM

## 2016-07-31 NOTE — Progress Notes (Signed)
Subjective:    CC:Follow-up  HPI: Bilateral hand pain: Suspected hand osteoarthritis, some chondrocalcinosis was seen on x-rays. Initially he did well with colchicine, we have also been giving him occasional interphalangeal joint injections under ultrasound guidance. NSAIDs have really not been intermittently effective. He's had a full rheumatologic workup and has seen the rheumatologist without any answer, he is also seen Dr. Amedeo Plenty.  He is having persistent pain, the joints injected the last visit are doing well, he would like his right fourth PIP and left second and third PIPs injected today.  Past medical history:  Negative.  See flowsheet/record as well for more information.  Surgical history: Negative.  See flowsheet/record as well for more information.  Family history: Negative.  See flowsheet/record as well for more information.  Social history: Negative.  See flowsheet/record as well for more information.  Allergies, and medications have been entered into the medical record, reviewed, and no changes needed.   Review of Systems: No fevers, chills, night sweats, weight loss, chest pain, or shortness of breath.   Objective:    General: Well Developed, well nourished, and in no acute distress.  Neuro: Alert and oriented x3, extra-ocular muscles intact, sensation grossly intact.  HEENT: Normocephalic, atraumatic, pupils equal round reactive to light, neck supple, no masses, no lymphadenopathy, thyroid nonpalpable.  Skin: Warm and dry, no rashes. Cardiac: Regular rate and rhythm, no murmurs rubs or gallops, no lower extremity edema.  Respiratory: Clear to auscultation bilaterally. Not using accessory muscles, speaking in full sentences.  Procedure: Real-time Ultrasound Guided Injection of right fourth proximal interphalangeal joint Device: GE Logiq E  Verbal informed consent obtained.  Time-out conducted.  Noted no overlying erythema, induration, or other signs of local infection.    Skin prepped in a sterile fashion.  Local anesthesia: Topical Ethyl chloride.  With sterile technique and under real time ultrasound guidance:  1/2 mL kenalog 40, 1/2 mL lidocaine injected easily Completed without difficulty  Pain immediately resolved suggesting accurate placement of the medication.  Advised to call if fevers/chills, erythema, induration, drainage, or persistent bleeding.  Images permanently stored and available for review in the ultrasound unit.  Impression: Technically successful ultrasound guided injection.  Procedure: Real-time Ultrasound Guided Injection of left second PIP Device: GE Logiq E  Verbal informed consent obtained.  Time-out conducted.  Noted no overlying erythema, induration, or other signs of local infection.  Skin prepped in a sterile fashion.  Local anesthesia: Topical Ethyl chloride.  With sterile technique and under real time ultrasound guidance:  1/2 mL kenalog 40, 1/2 mL lidocaine injected easily Completed without difficulty  Pain immediately resolved suggesting accurate placement of the medication.  Advised to call if fevers/chills, erythema, induration, drainage, or persistent bleeding.  Images permanently stored and available for review in the ultrasound unit.  Impression: Technically successful ultrasound guided injection.  Procedure: Real-time Ultrasound Guided Injection of left third PIP Device: GE Logiq E  Verbal informed consent obtained.  Time-out conducted.  Noted no overlying erythema, induration, or other signs of local infection.  Skin prepped in a sterile fashion.  Local anesthesia: Topical Ethyl chloride.  With sterile technique and under real time ultrasound guidance:  1/2 mL kenalog 40, 1/2 mL lidocaine injected easily Completed without difficulty  Pain immediately resolved suggesting accurate placement of the medication.  Advised to call if fevers/chills, erythema, induration, drainage, or persistent bleeding.  Images  permanently stored and available for review in the ultrasound unit.  Impression: Technically successful ultrasound guided injection.  Impression  and Recommendations:    Calcium pyrophosphate arthropathy of hand Proximal interphalangeal joint injections as above. Continue colchicine, he continues to be interested in PRP. I would like him to get a second opinion from hand surgery. He has stopped his colchicine has not noticed much improvement, he has already been on multiple NSAIDs, steroids. He has had a full rheumatologic workup that was negative including CCP, RF, ANA, CK, Erlichia, Lyme, and [facility] titers. X-rays have been fairly unrevealing. PRP remains an interest of his, I did let him know the lack of evidence for treatment of OA and CPPD.

## 2016-07-31 NOTE — Assessment & Plan Note (Addendum)
Proximal interphalangeal joint injections as above. Continue colchicine, he continues to be interested in PRP. I would like him to get a second opinion from hand surgery. He has stopped his colchicine has not noticed much improvement, he has already been on multiple NSAIDs, steroids. He has had a full rheumatologic workup that was negative including CCP, RF, ANA, CK, Erlichia, Lyme, and [facility] titers. X-rays have been fairly unrevealing. PRP remains an interest of his, I did let him know the lack of evidence for treatment of OA and CPPD. Ultimately I may recommend nerve conduction and EMG to ensure this is not an atypical presentation of peripheral neuropathy.

## 2016-08-01 ENCOUNTER — Encounter: Payer: Self-pay | Admitting: Sports Medicine

## 2016-08-01 NOTE — Progress Notes (Unsigned)
07/21/16 

## 2016-08-08 ENCOUNTER — Encounter: Payer: Self-pay | Admitting: Sports Medicine

## 2016-08-08 ENCOUNTER — Ambulatory Visit (INDEPENDENT_AMBULATORY_CARE_PROVIDER_SITE_OTHER): Payer: Managed Care, Other (non HMO) | Admitting: Sports Medicine

## 2016-08-08 DIAGNOSIS — M11849 Other specified crystal arthropathies, unspecified hand: Secondary | ICD-10-CM | POA: Diagnosis not present

## 2016-08-08 NOTE — Progress Notes (Signed)
Subjective:    CC:Follow-up  HPI: Bilateral hand pain: Suspected hand osteoarthritis, some chondrocalcinosis was seen on x-rays. Initially he did well with colchicine, we have also been giving him occasional interphalangeal joint injections under ultrasound guidance. NSAIDs have really not been intermittently effective. He's had a full rheumatologic workup and has seen the rheumatologist without any answer, he is also seen Dr. Amedeo Plenty.  He is having persistent pain, the joints injected the last visit are doing well, he would like his left first interphalangeal, left fourth proximal interphalangeal, and right fifth proximal interphalangeal joints injected today.  Past medical history:  Negative.  See flowsheet/record as well for more information.  Surgical history: Negative.  See flowsheet/record as well for more information.  Family history: Negative.  See flowsheet/record as well for more information.  Social history: Negative.  See flowsheet/record as well for more information.  Allergies, and medications have been entered into the medical record, reviewed, and no changes needed.   Review of Systems: No fevers, chills, night sweats, weight loss, chest pain, or shortness of breath.   Objective:    General: Well Developed, well nourished, and in no acute distress.  Neuro: Alert and oriented x3, extra-ocular muscles intact, sensation grossly intact.  HEENT: Normocephalic, atraumatic, pupils equal round reactive to light, neck supple, no masses, no lymphadenopathy, thyroid nonpalpable.  Skin: Warm and dry, no rashes. Cardiac: Regular rate and rhythm, no murmurs rubs or gallops, no lower extremity edema.  Respiratory: Clear to auscultation bilaterally. Not using accessory muscles, speaking in full sentences.  Procedure: Real-time Ultrasound Guided Injection of Left first interphalangeal joint Device: GE Logiq E  Verbal informed consent obtained.  Time-out conducted.  Noted no overlying  erythema, induration, or other signs of local infection.  Skin prepped in a sterile fashion.  Local anesthesia: Topical Ethyl chloride.  With sterile technique and under real time ultrasound guidance:  1/2 mL kenalog 40, 1/2 mL lidocaine injected easily Completed without difficulty  Pain immediately resolved suggesting accurate placement of the medication.  Advised to call if fevers/chills, erythema, induration, drainage, or persistent bleeding.  Images permanently stored and available for review in the ultrasound unit.  Impression: Technically successful ultrasound guided injection.  Procedure: Real-time Ultrasound Guided Injection of left fourth PIP Device: GE Logiq E  Verbal informed consent obtained.  Time-out conducted.  Noted no overlying erythema, induration, or other signs of local infection.  Skin prepped in a sterile fashion.  Local anesthesia: Topical Ethyl chloride.  With sterile technique and under real time ultrasound guidance:  1/2 mL kenalog 40, 1/2 mL lidocaine injected easily Completed without difficulty  Pain immediately resolved suggesting accurate placement of the medication.  Advised to call if fevers/chills, erythema, induration, drainage, or persistent bleeding.  Images permanently stored and available for review in the ultrasound unit.  Impression: Technically successful ultrasound guided injection.  Procedure: Real-time Ultrasound Guided Injection of right fifth PIP Device: GE Logiq E  Verbal informed consent obtained.  Time-out conducted.  Noted no overlying erythema, induration, or other signs of local infection.  Skin prepped in a sterile fashion.  Local anesthesia: Topical Ethyl chloride.  With sterile technique and under real time ultrasound guidance:  1/2 mL kenalog 40, 1/2 mL lidocaine injected easily Completed without difficulty  Pain immediately resolved suggesting accurate placement of the medication.  Advised to call if fevers/chills, erythema,  induration, drainage, or persistent bleeding.  Images permanently stored and available for review in the ultrasound unit.  Impression: Technically successful ultrasound guided injection.  Impression and Recommendations:    Calcium pyrophosphate arthropathy of hand Continues to be interested in PRP. Discontinue colchicine. He has an appointment coming up with Dr. Fredna Dow for a second opinion.  Full rheumatologic workup has been negative.  Today we did left first interphalangeal and fourth PIP injection, and right fifth PIP injection. Return as needed.

## 2016-08-08 NOTE — Assessment & Plan Note (Signed)
Continues to be interested in PRP. Discontinue colchicine. He has an appointment coming up with Dr. Fredna Dow for a second opinion.  Full rheumatologic workup has been negative.  Today we did left first interphalangeal and fourth PIP injection, and right fifth PIP injection. Return as needed.

## 2016-10-15 ENCOUNTER — Encounter: Payer: Self-pay | Admitting: Sports Medicine

## 2016-11-13 ENCOUNTER — Encounter: Payer: Self-pay | Admitting: Sports Medicine

## 2016-11-13 NOTE — Telephone Encounter (Signed)
Would you help this patient get his records?

## 2016-12-23 ENCOUNTER — Encounter: Payer: Self-pay | Admitting: Sports Medicine

## 2016-12-23 ENCOUNTER — Ambulatory Visit (INDEPENDENT_AMBULATORY_CARE_PROVIDER_SITE_OTHER): Payer: Managed Care, Other (non HMO) | Admitting: Sports Medicine

## 2016-12-23 DIAGNOSIS — M11849 Other specified crystal arthropathies, unspecified hand: Secondary | ICD-10-CM | POA: Diagnosis not present

## 2016-12-23 DIAGNOSIS — Z23 Encounter for immunization: Secondary | ICD-10-CM | POA: Diagnosis not present

## 2016-12-23 DIAGNOSIS — G5622 Lesion of ulnar nerve, left upper limb: Secondary | ICD-10-CM | POA: Diagnosis not present

## 2016-12-23 DIAGNOSIS — Z87891 Personal history of nicotine dependence: Secondary | ICD-10-CM | POA: Diagnosis not present

## 2016-12-23 DIAGNOSIS — L723 Sebaceous cyst: Secondary | ICD-10-CM | POA: Insufficient documentation

## 2016-12-23 MED ORDER — AZITHROMYCIN 250 MG PO TABS: ORAL_TABLET | ORAL | 0 refills | Status: DC

## 2016-12-23 NOTE — Assessment & Plan Note (Signed)
Elbow sleeve to encourage extension at nighttime. If insufficient relief after 4-6 weeks we can consider an ulnar nerve hydrodissection at the elbow.

## 2016-12-23 NOTE — Assessment & Plan Note (Signed)
We discontinued colchicine, he did see Dr. Fredna Dow for a second opinion, there is no surgical solution for this. Full rheumatologic workup has been negative. He is also seen a rheumatologist who started methotrexate, no improvement yet.

## 2016-12-23 NOTE — Progress Notes (Signed)
  Subjective:    CC: Multiple issues  HPI: Mass on neck: Present for years, asymptomatic.  Hand pain: Has been through the full workup, has see me, hand surgery, rheumatology, currently on methotrexate, the only thing that has worked in the past his colchicine.  Cubital tunnel syndrome left: With the visit with the hand surgeon he did have a nerve conduction study that showed some cubital tunnel syndrome, at the left elbow, on neuropathy. He tells me he had no paresthesias in the hand until after the nerve conduction study.  Cough: Present for 4-6 weeks, former smoker. Nonproductive, no constitutional symptoms.  Past medical history:  Negative.  See flowsheet/record as well for more information.  Surgical history: Negative.  See flowsheet/record as well for more information.  Family history: Negative.  See flowsheet/record as well for more information.  Social history: Negative.  See flowsheet/record as well for more information.  Allergies, and medications have been entered into the medical record, reviewed, and no changes needed.   Review of Systems: No fevers, chills, night sweats, weight loss, chest pain, or shortness of breath.   Objective:    General: Well Developed, well nourished, and in no acute distress.  Neuro: Alert and oriented x3, extra-ocular muscles intact, sensation grossly intact.  HEENT: Normocephalic, atraumatic, pupils equal round reactive to light, neck supple, no masses, no lymphadenopathy, thyroid nonpalpable. Oropharynx, nasopharynx, ear canals unremarkable. Skin: Warm and dry, no rashes. 1 cm sebaceous cyst on the left posterior neck Cardiac: Regular rate and rhythm, no murmurs rubs or gallops, no lower extremity edema.  Respiratory: Clear to auscultation bilaterally. Not using accessory muscles, speaking in full sentences.  Impression and Recommendations:    History of smoking Currently with subacute bronchitis over 6 weeks now. He is relatively  immunosuppressed being on methotrexate. Adding azithromycin, can use over-the-counter antitussives. Return to see me if no better in a couple of weeks.  Sebaceous cyst Asymptomatic, no further intervention needed. If it does start to hurt, father him, he can return and we can do a simple surgical excision here in the office.  Calcium pyrophosphate arthropathy of hand We discontinued colchicine, he did see Dr. Fredna Dow for a second opinion, there is no surgical solution for this. Full rheumatologic workup has been negative. He is also seen a rheumatologist who started methotrexate, no improvement yet.  Cubital tunnel syndrome, left Elbow sleeve to encourage extension at nighttime. If insufficient relief after 4-6 weeks we can consider an ulnar nerve hydrodissection at the elbow.  ___________________________________________ Gwen Her. Dianah Field, M.D., ABFM., CAQSM. Primary Care and [place] [employer] of [place] [employer] [facility] [employer]

## 2016-12-23 NOTE — Assessment & Plan Note (Signed)
Currently with subacute bronchitis over 6 weeks now. He is relatively immunosuppressed being on methotrexate. Adding azithromycin, can use over-the-counter antitussives. Return to see me if no better in a couple of weeks.

## 2016-12-23 NOTE — Assessment & Plan Note (Signed)
Asymptomatic, no further intervention needed. If it does start to hurt, father him, he can return and we can do a simple surgical excision here in the office.

## 2017-02-25 ENCOUNTER — Other Ambulatory Visit: Payer: Self-pay | Admitting: Sports Medicine

## 2017-02-25 DIAGNOSIS — H8113 Benign paroxysmal vertigo, bilateral: Secondary | ICD-10-CM

## 2017-02-26 ENCOUNTER — Other Ambulatory Visit: Payer: Self-pay

## 2017-02-26 DIAGNOSIS — M06041 Rheumatoid arthritis without rheumatoid factor, right hand: Secondary | ICD-10-CM | POA: Insufficient documentation

## 2017-02-27 MED ORDER — DIAZEPAM 5 MG PO TABS: 5.0000 mg | ORAL_TABLET | Freq: Every evening | ORAL | 0 refills | Status: DC | PRN

## 2017-03-18 ENCOUNTER — Other Ambulatory Visit: Payer: Self-pay | Admitting: Sports Medicine

## 2017-03-30 DIAGNOSIS — Z79899 Other long term (current) drug therapy: Secondary | ICD-10-CM | POA: Insufficient documentation

## 2017-05-26 ENCOUNTER — Other Ambulatory Visit: Payer: Self-pay | Admitting: Physician Assistant

## 2017-05-26 DIAGNOSIS — H8113 Benign paroxysmal vertigo, bilateral: Secondary | ICD-10-CM

## 2017-05-27 ENCOUNTER — Encounter: Payer: Self-pay | Admitting: Sports Medicine

## 2017-05-27 DIAGNOSIS — H8113 Benign paroxysmal vertigo, bilateral: Secondary | ICD-10-CM

## 2017-05-27 MED ORDER — DIAZEPAM 5 MG PO TABS: 5.0000 mg | ORAL_TABLET | Freq: Every evening | ORAL | 0 refills | Status: DC | PRN

## 2017-05-28 NOTE — Telephone Encounter (Signed)
Pt requesting RF for Valium. Please review and send if appropriate to [facility]. Thanks!

## 2017-05-28 NOTE — Telephone Encounter (Signed)
I did it yesterday

## 2017-05-29 NOTE — Telephone Encounter (Signed)
Noted. RX received by pharmacy.

## 2017-07-01 ENCOUNTER — Other Ambulatory Visit: Payer: Self-pay | Admitting: Sports Medicine

## 2017-07-01 DIAGNOSIS — M159 Polyosteoarthritis, unspecified: Secondary | ICD-10-CM

## 2017-07-01 DIAGNOSIS — H8113 Benign paroxysmal vertigo, bilateral: Secondary | ICD-10-CM

## 2017-07-02 ENCOUNTER — Other Ambulatory Visit: Payer: Self-pay | Admitting: Sports Medicine

## 2017-07-02 MED ORDER — OMEPRAZOLE 40 MG PO CPDR: 40.0000 mg | DELAYED_RELEASE_CAPSULE | Freq: Every day | ORAL | 3 refills | Status: DC

## 2017-08-17 ENCOUNTER — Encounter: Payer: Self-pay | Admitting: Sports Medicine

## 2017-08-17 ENCOUNTER — Ambulatory Visit: Payer: 59 | Admitting: Sports Medicine

## 2017-08-17 DIAGNOSIS — L568 Other specified acute skin changes due to ultraviolet radiation: Secondary | ICD-10-CM | POA: Diagnosis not present

## 2017-08-17 DIAGNOSIS — F329 Major depressive disorder, single episode, unspecified: Secondary | ICD-10-CM | POA: Diagnosis not present

## 2017-08-17 DIAGNOSIS — F32A Depression, unspecified: Secondary | ICD-10-CM

## 2017-08-17 DIAGNOSIS — M11849 Other specified crystal arthropathies, unspecified hand: Secondary | ICD-10-CM

## 2017-08-17 DIAGNOSIS — F419 Anxiety disorder, unspecified: Secondary | ICD-10-CM | POA: Diagnosis not present

## 2017-08-17 MED ORDER — TRIAMCINOLONE ACETONIDE 0.1 % EX CREA: 1.0000 "application " | TOPICAL_CREAM | Freq: Two times a day (BID) | CUTANEOUS | 0 refills | Status: DC

## 2017-08-17 MED ORDER — DULOXETINE HCL 30 MG PO CPEP: 30.0000 mg | ORAL_CAPSULE | Freq: Every day | ORAL | 3 refills | Status: DC

## 2017-08-17 NOTE — Assessment & Plan Note (Signed)
Adding topical triamcinolone, SPF 50 or greater when the rash clears.

## 2017-08-17 NOTE — Progress Notes (Signed)
Subjective:    CC: Anxiety  HPI: Cody Kane is a pleasant 62 year old male, we have been treating him for polyarthralgias, rheumatoid testing has been negative, he does have some signs of calcium pyrophosphate deposition disease on x-rays, we have tried multiple medications including methotrexate, steroids, NSAIDs, narcotics, as well as multiple interphalangeal joint injections with only minimal to moderate improvement.  He has been seen by rheumatology, the going diagnosis is currently seronegative rheumatoid arthritis.  He has been on leflunomide which has not been very helpful, and more recently started Humira injections.  In addition he is noted increasing anxiety, difficulty sleeping, depressed mood.  No suicidal or homicidal ideation.  Since taking leflunomide he has noted a rash over the dorsum of both forearms, worse in the sun.  More burning than itching.  I reviewed the past medical history, family history, social history, surgical history, and allergies today and no changes were needed.  Please see the problem list section below in epic for further details.  Past Medical History: Past Medical History:  Diagnosis Date  . Arthritis   . BPH (benign prostatic hypertrophy)   . Elevated PSA   . GERD (gastroesophageal reflux disease)   . Lower urinary tract symptoms (LUTS)   . Mild obstructive sleep apnea    pt just had study done--  to start using cpap next week   Past Surgical History: Past Surgical History:  Procedure Laterality Date  . COLONOSCOPY  2011  . CYSTOSCOPY N/A 12/02/2013   Procedure: CYSTOSCOPY;  Surgeon: Festus Aloe, MD;  Location: [facility];  Service: Urology;  Laterality: N/A;  . GREEN LIGHT LASER TURP (TRANSURETHRAL RESECTION OF PROSTATE N/A 01/10/2014   Procedure: GREEN LIGHT LASER TURP (TRANSURETHRAL RESECTION OF PROSTATE;  Surgeon: Festus Aloe, MD;  Location[facility];  Service: Urology;  Laterality: N/A;  . PROSTATE  BIOPSY N/A 12/02/2013   Procedure: BIOPSY TRANSRECTAL ULTRASONIC PROSTATE (TUBP);  Surgeon: Festus Aloe, MD;  Location[facility];  Service: Urology;  Laterality: N/A;   Social History: Social History   Socioeconomic History  . Marital status: Married    Spouse name: [name]  . Number of children: 1  . Years of education: College  . Highest education level: Not on file  Occupational History    Comment: Administration   Social Needs  . Financial resource strain: Not on file  . Food insecurity:    Worry: Not on file    Inability: Not on file  . Transportation needs:    Medical: Not on file    Non-medical: Not on file  Tobacco Use  . Smoking status: Former Smoker    Packs/day: 1.00    Years: 25.00    Pack years: 25.00    Types: Cigarettes    Last attempt to quit: 05/01/2009    Years since quitting: 8.3  . Smokeless tobacco: Never Used  . Tobacco comment: Pt uses "vape" rare occasion   Substance and Sexual Activity  . Alcohol use: Yes    Alcohol/week: 0.0 oz    Comment: occasional  . Drug use: No  . Sexual activity: Not on file  Lifestyle  . Physical activity:    Days per week: Not on file    Minutes per session: Not on file  . Stress: Not on file  Relationships  . Social connections:    Talks on phone: Not on file    Gets together: Not on file    Attends religious service: Not on file  Active member of club or organization: Not on file    Attends meetings of clubs or organizations: Not on file    Relationship status: Not on file  Other Topics Concern  . Not on file  Social History Narrative   Drinks about 2 cups of coffee a day    Family History: Family History  Problem Relation Age of Onset  . Cancer Mother        colon  . Cancer Father        lung   Allergies: Allergies  Allergen Reactions  . Acetaminophen Other (See Comments)    headaches   Medications: See med rec.  Review of Systems: No fevers, chills, night sweats, weight  loss, chest pain, or shortness of breath.   Objective:    General: Well Developed, well nourished, and in no acute distress.  Neuro: Alert and oriented x3, extra-ocular muscles intact, sensation grossly intact.  HEENT: Normocephalic, atraumatic, pupils equal round reactive to light, neck supple, no masses, no lymphadenopathy, thyroid nonpalpable.  Skin: Warm and dry, minimally erythematous rash on both dorsal forearms without signs of bacterial superinfection. Cardiac: Regular rate and rhythm, no murmurs rubs or gallops, no lower extremity edema.  Respiratory: Clear to auscultation bilaterally. Not using accessory muscles, speaking in full sentences.  Impression and Recommendations:    Anxiety and depression Cymbalta 30. Return in 1 month for PHQ and GAD, we did discuss the pathophysiology and neuroanatomy of anxiety and depression.  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 Currently being treated with leflunomide, Humira with rheumatology. He does desire to come here for his procedures.  Photosensitivity dermatitis Adding topical triamcinolone, SPF 50 or greater when the rash clears.  I spent 25 minutes with this patient, greater than 50% was face-to-face time counseling regarding the above diagnoses ___________________________________________ Gwen Her. Dianah Field, M.D., ABFM., CAQSM. Primary Care and [place] Instructor of [place] of [facility] of Medicine

## 2017-08-17 NOTE — Assessment & Plan Note (Signed)
Cymbalta 30. Return in 1 month for PHQ and GAD, we did discuss the pathophysiology and neuroanatomy of anxiety and depression.

## 2017-08-17 NOTE — Assessment & Plan Note (Signed)
Currently being treated with leflunomide, Humira with rheumatology. He does desire to come here for his procedures.

## 2017-09-14 ENCOUNTER — Ambulatory Visit: Payer: 59 | Admitting: Sports Medicine

## 2017-09-14 ENCOUNTER — Encounter: Payer: Self-pay | Admitting: Sports Medicine

## 2017-09-14 DIAGNOSIS — L989 Disorder of the skin and subcutaneous tissue, unspecified: Secondary | ICD-10-CM | POA: Diagnosis not present

## 2017-09-14 DIAGNOSIS — F329 Major depressive disorder, single episode, unspecified: Secondary | ICD-10-CM

## 2017-09-14 DIAGNOSIS — F419 Anxiety disorder, unspecified: Secondary | ICD-10-CM | POA: Diagnosis not present

## 2017-09-14 DIAGNOSIS — F32A Depression, unspecified: Secondary | ICD-10-CM

## 2017-09-14 MED ORDER — DULOXETINE HCL 60 MG PO CPEP: 60.0000 mg | ORAL_CAPSULE | Freq: Every day | ORAL | 3 refills | Status: DC

## 2017-09-14 NOTE — Progress Notes (Signed)
Subjective:    CC: Follow-up  HPI: Cody Kane returns, we have been treating for depression and anxiety, we started Cymbalta 30 mg at the last visit, he has not noted much of an improvement yet.  In addition he is noted a lesion on the tip of his nose, concerned that this may be a malignancy.  It has been present for several weeks and is not responding to topical triamcinolone.  I reviewed the past medical history, family history, social history, surgical history, and allergies today and no changes were needed.  Please see the problem list section below in epic for further details.  Past Medical History: Past Medical History:  Diagnosis Date  . Arthritis   . BPH (benign prostatic hypertrophy)   . Elevated PSA   . GERD (gastroesophageal reflux disease)   . Lower urinary tract symptoms (LUTS)   . Mild obstructive sleep apnea    pt just had study done--  to start using cpap next week   Past Surgical History: Past Surgical History:  Procedure Laterality Date  . COLONOSCOPY  2011  . CYSTOSCOPY N/A 12/02/2013   Procedure: CYSTOSCOPY;  Surgeon: Festus Aloe, MD;  Location: [facility];  Service: Urology;  Laterality: N/A;  . GREEN LIGHT LASER TURP (TRANSURETHRAL RESECTION OF PROSTATE N/A 01/10/2014   Procedure: GREEN LIGHT LASER TURP (TRANSURETHRAL RESECTION OF PROSTATE;  Surgeon: Festus Aloe, MD;  Location: [facility];  Service: Urology;  Laterality: N/A;  . PROSTATE BIOPSY N/A 12/02/2013   Procedure: BIOPSY TRANSRECTAL ULTRASONIC PROSTATE (TUBP);  Surgeon: Festus Aloe, MD;  Location: [facility];  Service: Urology;  Laterality: N/A;   Social History: Social History   Socioeconomic History  . Marital status: Married    Spouse name: [name]  . Number of children: 1  . Years of education: College  . Highest education level: Not on file  Occupational History    Comment: Administration   Social Needs  . Financial resource  strain: Not on file  . Food insecurity:    Worry: Not on file    Inability: Not on file  . Transportation needs:    Medical: Not on file    Non-medical: Not on file  Tobacco Use  . Smoking status: Former Smoker    Packs/day: 1.00    Years: 25.00    Pack years: 25.00    Types: Cigarettes    Last attempt to quit: 05/01/2009    Years since quitting: 8.3  . Smokeless tobacco: Never Used  . Tobacco comment: Pt uses "vape" rare occasion   Substance and Sexual Activity  . Alcohol use: Yes    Alcohol/week: 0.0 oz    Comment: occasional  . Drug use: No  . Sexual activity: Not on file  Lifestyle  . Physical activity:    Days per week: Not on file    Minutes per session: Not on file  . Stress: Not on file  Relationships  . Social connections:    Talks on phone: Not on file    Gets together: Not on file    Attends religious service: Not on file    Active member of club or organization: Not on file    Attends meetings of clubs or organizations: Not on file    Relationship status: Not on file  Other Topics Concern  . Not on file  Social History Narrative   Drinks about 2 cups of coffee a day    Family History: Family History  Problem  Relation Age of Onset  . Cancer Mother        colon  . Cancer Father        lung   Allergies: Allergies  Allergen Reactions  . Acetaminophen Other (See Comments)    headaches   Medications: See med rec.  Review of Systems: No fevers, chills, night sweats, weight loss, chest pain, or shortness of breath.   Objective:    General: Well Developed, well nourished, and in no acute distress.  Neuro: Alert and oriented x3, extra-ocular muscles intact, sensation grossly intact.  HEENT: Normocephalic, atraumatic, pupils equal round reactive to light, neck supple, no masses, no lymphadenopathy, thyroid nonpalpable.  Skin: Warm and dry, no rashes.  There is somewhat of a crusty lesion on the tip of his nose with minimal erythema, no pain. Cardiac:  Regular rate and rhythm, no murmurs rubs or gallops, no lower extremity edema.  Respiratory: Clear to auscultation bilaterally. Not using accessory muscles, speaking in full sentences.  Impression and Recommendations:    Anxiety and depression Symptoms really not much better on Cymbalta 30, increasing to 60 mg daily, return in 1 month for PHQ and GAD.  Non-healing skin lesion of nose Patient will return for shave biopsy with Valium taken before hand. He will have a driver. ___________________________________________ Gwen Her. Dianah Field, M.D., ABFM., CAQSM. Primary Care and [place] Instructor of [place] of [facility] of Medicine

## 2017-09-14 NOTE — Assessment & Plan Note (Signed)
Symptoms really not much better on Cymbalta 30, increasing to 60 mg daily, return in 1 month for PHQ and GAD.

## 2017-09-14 NOTE — Assessment & Plan Note (Signed)
Patient will return for shave biopsy with Valium taken before hand. He will have a driver.

## 2017-09-24 ENCOUNTER — Encounter: Payer: Self-pay | Admitting: Sports Medicine

## 2017-09-24 ENCOUNTER — Ambulatory Visit: Payer: 59 | Admitting: Sports Medicine

## 2017-09-24 VITALS — BP 135/79 | HR 86 | Ht 70.0 in | Wt 184.0 lb

## 2017-09-24 DIAGNOSIS — L989 Disorder of the skin and subcutaneous tissue, unspecified: Secondary | ICD-10-CM

## 2017-09-24 NOTE — Progress Notes (Signed)
   Patient is here, we decided to do cryotherapy, no liquid nitrogen available, this visit will be no-charged.

## 2017-10-09 ENCOUNTER — Encounter: Payer: Self-pay | Admitting: Sports Medicine

## 2017-10-09 ENCOUNTER — Ambulatory Visit (INDEPENDENT_AMBULATORY_CARE_PROVIDER_SITE_OTHER): Payer: 59 | Admitting: Sports Medicine

## 2017-10-09 DIAGNOSIS — F419 Anxiety disorder, unspecified: Secondary | ICD-10-CM

## 2017-10-09 DIAGNOSIS — L989 Disorder of the skin and subcutaneous tissue, unspecified: Secondary | ICD-10-CM | POA: Diagnosis not present

## 2017-10-09 DIAGNOSIS — L919 Hypertrophic disorder of the skin, unspecified: Secondary | ICD-10-CM

## 2017-10-09 DIAGNOSIS — F32A Depression, unspecified: Secondary | ICD-10-CM

## 2017-10-09 DIAGNOSIS — F329 Major depressive disorder, single episode, unspecified: Secondary | ICD-10-CM

## 2017-10-09 MED ORDER — DULOXETINE HCL 60 MG PO CPEP: 60.0000 mg | ORAL_CAPSULE | Freq: Every day | ORAL | 3 refills | Status: DC

## 2017-10-09 NOTE — Assessment & Plan Note (Signed)
Shave biopsy of lesion suspicious for squamous cell carcinoma on the nose, removal of skin tag on the right cheek. Return in 1 week for a wound check.

## 2017-10-09 NOTE — Progress Notes (Signed)
   Procedure: Shave biopsy of 1.1 cm suspicious lesion on nose Risks, benefits, and alternatives explained and consent obtained. Time out conducted. Surface prepped with alcohol. 1cc lidocaine with epinephine infiltrated in a field block. Adequate anesthesia ensured. Area prepped and draped in a sterile fashion. Excision performed with: Using a DermaBlade I made a shave biopsy into the dermis, hyfrecation was used to achieve hemostasis. Hemostasis achieved. Pt stab    Procedure:  Excision of right cheek skin tag Risks, benefits, and alternatives explained and consent obtained. Time out conducted. Surface prepped with alcohol. 2cc lidocaine with epinephine infiltrated in a field block. Adequate anesthesia ensured. Area prepped and draped in a sterile fashion. Excision performed with: Scalpel used to excise base of skin tag, hyfrecation used for hemostasis. Hemostasis achieved. Pt stable.le.

## 2017-10-09 NOTE — Addendum Note (Signed)
Addended by: Darene Lamer on: 10/09/2017 08:51 AM   Modules accepted: Orders

## 2017-10-15 ENCOUNTER — Ambulatory Visit: Payer: 59 | Admitting: Sports Medicine

## 2017-10-16 ENCOUNTER — Encounter: Payer: Self-pay | Admitting: Sports Medicine

## 2017-10-16 ENCOUNTER — Ambulatory Visit: Payer: 59 | Admitting: Sports Medicine

## 2017-10-16 DIAGNOSIS — Z23 Encounter for immunization: Secondary | ICD-10-CM | POA: Diagnosis not present

## 2017-10-16 DIAGNOSIS — L989 Disorder of the skin and subcutaneous tissue, unspecified: Secondary | ICD-10-CM

## 2017-10-16 DIAGNOSIS — F32A Depression, unspecified: Secondary | ICD-10-CM

## 2017-10-16 DIAGNOSIS — F419 Anxiety disorder, unspecified: Secondary | ICD-10-CM

## 2017-10-16 DIAGNOSIS — F329 Major depressive disorder, single episode, unspecified: Secondary | ICD-10-CM | POA: Diagnosis not present

## 2017-10-16 NOTE — Assessment & Plan Note (Signed)
Derm path showed squamous cell carcinoma, we did an aggressive hyfrecation of the base of the shave biopsy site. He does have another one on the left arm but prefers we do not do anything about it, risks, benefits explained.

## 2017-10-16 NOTE — Assessment & Plan Note (Signed)
Fantastic improvements on symptoms at 60 of Cymbalta.

## 2017-10-16 NOTE — Progress Notes (Signed)
Subjective:    CC: Follow-up  HPI: Skin lesion: Shave biopsy done on the nose at the last visit, squamous cell carcinoma was identified.  Anxiety and depression: Symptoms now completely resolved for the most part with Cymbalta 60.  Happy with how things are going.  I reviewed the past medical history, family history, social history, surgical history, and allergies today and no changes were needed.  Please see the problem list section below in epic for further details.  Past Medical History: Past Medical History:  Diagnosis Date  . Arthritis   . BPH (benign prostatic hypertrophy)   . Elevated PSA   . GERD (gastroesophageal reflux disease)   . Lower urinary tract symptoms (LUTS)   . Mild obstructive sleep apnea    pt just had study done--  to start using cpap next week   Past Surgical History: Past Surgical History:  Procedure Laterality Date  . COLONOSCOPY  2011  . CYSTOSCOPY N/A 12/02/2013   Procedure: CYSTOSCOPY;  Surgeon: Festus Aloe, MD;  Location: [facility]  Service: Urology;  Laterality: N/A;  . GREEN LIGHT LASER TURP (TRANSURETHRAL RESECTION OF PROSTATE N/A 01/10/2014   Procedure: GREEN LIGHT LASER TURP (TRANSURETHRAL RESECTION OF PROSTATE;  Surgeon: Festus Aloe, MD;  Location: [facility];  Service: Urology;  Laterality: N/A;  . PROSTATE BIOPSY N/A 12/02/2013   Procedure: BIOPSY TRANSRECTAL ULTRASONIC PROSTATE (TUBP);  Surgeon: Festus Aloe, MD;  Location: [facility];  Service: Urology;  Laterality: N/A;   Social History: Social History   Socioeconomic History  . Marital status: Married    Spouse name: [name]  . Number of children: 1  . Years of education: College  . Highest education level: Not on file  Occupational History    Comment: Administration   Social Needs  . Financial resource strain: Not on file  . Food insecurity:    Worry: Not on file    Inability: Not on file  . Transportation needs:     Medical: Not on file    Non-medical: Not on file  Tobacco Use  . Smoking status: Former Smoker    Packs/day: 1.00    Years: 25.00    Pack years: 25.00    Types: Cigarettes    Last attempt to quit: 05/01/2009    Years since quitting: 8.4  . Smokeless tobacco: Never Used  . Tobacco comment: Pt uses "vape" rare occasion   Substance and Sexual Activity  . Alcohol use: Yes    Alcohol/week: 0.0 standard drinks    Comment: occasional  . Drug use: No  . Sexual activity: Not on file  Lifestyle  . Physical activity:    Days per week: Not on file    Minutes per session: Not on file  . Stress: Not on file  Relationships  . Social connections:    Talks on phone: Not on file    Gets together: Not on file    Attends religious service: Not on file    Active member of club or organization: Not on file    Attends meetings of clubs or organizations: Not on file    Relationship status: Not on file  Other Topics Concern  . Not on file  Social History Narrative   Drinks about 2 cups of coffee a day    Family History: Family History  Problem Relation Age of Onset  . Cancer Mother        colon  . Cancer Father  lung   Allergies: Allergies  Allergen Reactions  . Acetaminophen Other (See Comments)    headaches   Medications: See med rec.  Review of Systems: No fevers, chills, night sweats, weight loss, chest pain, or shortness of breath.   Objective:    General: Well Developed, well nourished, and in no acute distress.  Neuro: Alert and oriented x3, extra-ocular muscles intact, sensation grossly intact.  HEENT: Normocephalic, atraumatic, pupils equal round reactive to light, neck supple, no masses, no lymphadenopathy, thyroid nonpalpable.  Shave biopsy site healing well. Skin: Warm and dry, no rashes.  There is a rough lesion on his left forearm. Cardiac: Regular rate and rhythm, no murmurs rubs or gallops, no lower extremity edema.  Respiratory: Clear to auscultation  bilaterally. Not using accessory muscles, speaking in full sentences.  Impression and Recommendations:    Anxiety and depression Fantastic improvements on symptoms at 60 of Cymbalta.  Non-healing skin lesion of nose Derm path showed squamous cell carcinoma, we did an aggressive hyfrecation of the base of the shave biopsy site. He does have another one on the left arm but prefers we do not do anything about it, risks, benefits explained.  ___________________________________________ Gwen Her. Dianah Field, M.D., ABFM., CAQSM. Primary Care and [place] Instructor of [place] of [facility] of Medicine

## 2017-10-28 ENCOUNTER — Encounter: Payer: Self-pay | Admitting: Sports Medicine

## 2017-12-10 ENCOUNTER — Ambulatory Visit: Payer: 59 | Admitting: Sports Medicine

## 2017-12-10 ENCOUNTER — Encounter: Payer: Self-pay | Admitting: Sports Medicine

## 2017-12-10 ENCOUNTER — Telehealth: Payer: Self-pay | Admitting: Sports Medicine

## 2017-12-10 DIAGNOSIS — F32A Depression, unspecified: Secondary | ICD-10-CM

## 2017-12-10 DIAGNOSIS — F419 Anxiety disorder, unspecified: Secondary | ICD-10-CM

## 2017-12-10 DIAGNOSIS — F329 Major depressive disorder, single episode, unspecified: Secondary | ICD-10-CM | POA: Diagnosis not present

## 2017-12-10 MED ORDER — DULOXETINE HCL 30 MG PO CPEP: 90.0000 mg | ORAL_CAPSULE | Freq: Every day | ORAL | 3 refills | Status: DC

## 2017-12-10 NOTE — Progress Notes (Signed)
Subjective:    CC: Follow-up  HPI: Cody Kane returns, initially his symptoms of depression and anxiety were well controlled, his job cutting back on hours to the point where he cannot sustain his quality of life, this is worsened his depression.  No suicidal or homicidal ideation.  I reviewed the past medical history, family history, social history, surgical history, and allergies today and no changes were needed.  Please see the problem list section below in epic for further details.  Past Medical History: Past Medical History:  Diagnosis Date  . Arthritis   . BPH (benign prostatic hypertrophy)   . Elevated PSA   . GERD (gastroesophageal reflux disease)   . Lower urinary tract symptoms (LUTS)   . Mild obstructive sleep apnea    pt just had study done--  to start using cpap next week   Past Surgical History: Past Surgical History:  Procedure Laterality Date  . COLONOSCOPY  2011  . CYSTOSCOPY N/A 12/02/2013   Procedure: CYSTOSCOPY;  Surgeon: Festus Aloe, MD;  Location: [facility];  Service: Urology;  Laterality: N/A;  . GREEN LIGHT LASER TURP (TRANSURETHRAL RESECTION OF PROSTATE N/A 01/10/2014   Procedure: GREEN LIGHT LASER TURP (TRANSURETHRAL RESECTION OF PROSTATE;  Surgeon: Festus Aloe, MD;  Location: [facility];  Service: Urology;  Laterality: N/A;  . PROSTATE BIOPSY N/A 12/02/2013   Procedure: BIOPSY TRANSRECTAL ULTRASONIC PROSTATE (TUBP);  Surgeon: Festus Aloe, MD;  Location: [facility];  Service: Urology;  Laterality: N/A;   Social History: Social History   Socioeconomic History  . Marital status: Married    Spouse name: [name]  . Number of children: 1  . Years of education: College  . Highest education level: Not on file  Occupational History    Comment: Administration   Social Needs  . Financial resource strain: Not on file  . Food insecurity:    Worry: Not on file    Inability: Not on file  .  Transportation needs:    Medical: Not on file    Non-medical: Not on file  Tobacco Use  . Smoking status: Former Smoker    Packs/day: 1.00    Years: 25.00    Pack years: 25.00    Types: Cigarettes    Last attempt to quit: 05/01/2009    Years since quitting: 8.6  . Smokeless tobacco: Never Used  . Tobacco comment: Pt uses "vape" rare occasion   Substance and Sexual Activity  . Alcohol use: Yes    Alcohol/week: 0.0 standard drinks    Comment: occasional  . Drug use: No  . Sexual activity: Not on file  Lifestyle  . Physical activity:    Days per week: Not on file    Minutes per session: Not on file  . Stress: Not on file  Relationships  . Social connections:    Talks on phone: Not on file    Gets together: Not on file    Attends religious service: Not on file    Active member of club or organization: Not on file    Attends meetings of clubs or organizations: Not on file    Relationship status: Not on file  Other Topics Concern  . Not on file  Social History Narrative   Drinks about 2 cups of coffee a day    Family History: Family History  Problem Relation Age of Onset  . Cancer Mother        colon  . Cancer Father  lung   Allergies: Allergies  Allergen Reactions  . Acetaminophen Other (See Comments)    headaches   Medications: See med rec.  Review of Systems: No fevers, chills, night sweats, weight loss, chest pain, or shortness of breath.   Objective:    General: Well Developed, well nourished, and in no acute distress.  Neuro: Alert and oriented x3, extra-ocular muscles intact, sensation grossly intact.  HEENT: Normocephalic, atraumatic, pupils equal round reactive to light, neck supple, no masses, no lymphadenopathy, thyroid nonpalpable.  Skin: Warm and dry, no rashes. Cardiac: Regular rate and rhythm, no murmurs rubs or gallops, no lower extremity edema.  Respiratory: Clear to auscultation bilaterally. Not using accessory muscles, speaking in full  sentences.  Impression and Recommendations:    Anxiety and depression Although initially well controlled he has been cut back in his hours to the point where he does not make enough my to sustain his quality of life. Worsening depressive symptoms regarding his job and his income. I told him there is nothing I can do to fix this. We are going to increase Cymbalta to 90 mg daily in the meantime, return as needed.  I spent 25 minutes with this patient, greater than 50% was face-to-face time counseling regarding the above diagnoses, specifically he spent a great deal of time describing to me why he was upset and his issues with his job. ___________________________________________ Gwen Her. Dianah Field, M.D., ABFM., CAQSM. Primary Care and [place] Instructor of [place] of [facility] of Medicine

## 2017-12-10 NOTE — Telephone Encounter (Signed)
Received fax from [employer] that Cymbalta requires a PA. Information has been sent to the insurance company. Awaiting determination.

## 2017-12-10 NOTE — Assessment & Plan Note (Signed)
Although initially well controlled he has been cut back in his hours to the point where he does not make enough my to sustain his quality of life. Worsening depressive symptoms regarding his job and his income. I told him there is nothing I can do to fix this. We are going to increase Cymbalta to 90 mg daily in the meantime, return as needed.

## 2017-12-14 NOTE — Telephone Encounter (Signed)
Duloxetine has been approved from 12/10/2017 through 12/11/2018. Form sent to scan. -hsm.

## 2018-01-08 ENCOUNTER — Encounter: Payer: Self-pay | Admitting: Sports Medicine

## 2018-01-08 ENCOUNTER — Ambulatory Visit: Payer: 59 | Admitting: Sports Medicine

## 2018-01-08 DIAGNOSIS — F329 Major depressive disorder, single episode, unspecified: Secondary | ICD-10-CM

## 2018-01-08 DIAGNOSIS — L989 Disorder of the skin and subcutaneous tissue, unspecified: Secondary | ICD-10-CM | POA: Insufficient documentation

## 2018-01-08 DIAGNOSIS — F419 Anxiety disorder, unspecified: Secondary | ICD-10-CM

## 2018-01-08 DIAGNOSIS — F32A Depression, unspecified: Secondary | ICD-10-CM

## 2018-01-08 MED ORDER — ARIPIPRAZOLE 5 MG PO TABS: 5.0000 mg | ORAL_TABLET | Freq: Every day | ORAL | 3 refills | Status: DC

## 2018-01-08 MED ORDER — DULOXETINE HCL 60 MG PO CPEP: 120.0000 mg | ORAL_CAPSULE | Freq: Every day | ORAL | 3 refills | Status: DC

## 2018-01-08 NOTE — Assessment & Plan Note (Signed)
Return in a week or 2 for shave biopsy.

## 2018-01-08 NOTE — Progress Notes (Signed)
Subjective:    CC: Follow-up  HPI: This is a pleasant 62 year old male, here for follow-up of his depression.  He continues to lose weight, symptoms are mostly revolving around work.  No suicidal or homicidal ideation.  I reviewed the past medical history, family history, social history, surgical history, and allergies today and no changes were needed.  Please see the problem list section below in epic for further details.  Past Medical History: Past Medical History:  Diagnosis Date  . Arthritis   . BPH (benign prostatic hypertrophy)   . Elevated PSA   . GERD (gastroesophageal reflux disease)   . Lower urinary tract symptoms (LUTS)   . Mild obstructive sleep apnea    pt just had study done--  to start using cpap next week   Past Surgical History: Past Surgical History:  Procedure Laterality Date  . COLONOSCOPY  2011  . CYSTOSCOPY N/A 12/02/2013   Procedure: CYSTOSCOPY;  Surgeon: Festus Aloe, MD;  Location: [facility];  Service: Urology;  Laterality: N/A;  . GREEN LIGHT LASER TURP (TRANSURETHRAL RESECTION OF PROSTATE N/A 01/10/2014   Procedure: GREEN LIGHT LASER TURP (TRANSURETHRAL RESECTION OF PROSTATE;  Surgeon: Festus Aloe, MD;  Location: [facility];  Service: Urology;  Laterality: N/A;  . PROSTATE BIOPSY N/A 12/02/2013   Procedure: BIOPSY TRANSRECTAL ULTRASONIC PROSTATE (TUBP);  Surgeon: Festus Aloe, MD;  Location: [facility];  Service: Urology;  Laterality: N/A;   Social History: Social History   Socioeconomic History  . Marital status: Married    Spouse name: [name]  . Number of children: 1  . Years of education: College  . Highest education level: Not on file  Occupational History    Comment: Administration   Social Needs  . Financial resource strain: Not on file  . Food insecurity:    Worry: Not on file    Inability: Not on file  . Transportation needs:    Medical: Not on file    Non-medical: Not  on file  Tobacco Use  . Smoking status: Former Smoker    Packs/day: 1.00    Years: 25.00    Pack years: 25.00    Types: Cigarettes    Last attempt to quit: 05/01/2009    Years since quitting: 8.6  . Smokeless tobacco: Never Used  . Tobacco comment: Pt uses "vape" rare occasion   Substance and Sexual Activity  . Alcohol use: Yes    Alcohol/week: 0.0 standard drinks    Comment: occasional  . Drug use: No  . Sexual activity: Not on file  Lifestyle  . Physical activity:    Days per week: Not on file    Minutes per session: Not on file  . Stress: Not on file  Relationships  . Social connections:    Talks on phone: Not on file    Gets together: Not on file    Attends religious service: Not on file    Active member of club or organization: Not on file    Attends meetings of clubs or organizations: Not on file    Relationship status: Not on file  Other Topics Concern  . Not on file  Social History Narrative   Drinks about 2 cups of coffee a day    Family History: Family History  Problem Relation Age of Onset  . Cancer Mother        colon  . Cancer Father        lung   Allergies: Allergies  Allergen Reactions  . Acetaminophen Other (See Comments)    headaches   Medications: See med rec.  Review of Systems: No fevers, chills, night sweats, weight loss, chest pain, or shortness of breath.   Objective:    General: Well Developed, well nourished, and in no acute distress.  Neuro: Alert and oriented x3, extra-ocular muscles intact, sensation grossly intact.  HEENT: Normocephalic, atraumatic, pupils equal round reactive to light, neck supple, no masses, no lymphadenopathy, thyroid nonpalpable.  Skin: Warm and dry, no rashes. Cardiac: Regular rate and rhythm, no murmurs rubs or gallops, no lower extremity edema.  Respiratory: Clear to auscultation bilaterally. Not using accessory muscles, speaking in full sentences.  Impression and Recommendations:    Anxiety and  depression Persistent depression on 90 of Cymbalta, continue weight loss. No suicidal or homicidal ideation. He is getting some episodes of dissociation. Increasing Cymbalta to 120 and adding Abilify 5 Return in 1 month.  Arm skin lesion, left Return in a week or 2 for shave biopsy.  I spent 25 minutes with this patient, greater than 50% was face-to-face time counseling regarding the above diagnoses, specifically discussing the further treatment plan for his depression contracting for safety. ___________________________________________ Gwen Her. Dianah Field, M.D., ABFM., CAQSM. Primary Care and Sports Medicine [facility]  Adjunct Professor of [place] of [facility] of Medicine

## 2018-01-08 NOTE — Assessment & Plan Note (Addendum)
Persistent depression on 90 of Cymbalta, continue weight loss. No suicidal or homicidal ideation. He is getting some episodes of dissociation. Increasing Cymbalta to 120 and adding Abilify 5 Return in 1 month.

## 2018-01-22 ENCOUNTER — Encounter: Payer: Self-pay | Admitting: Sports Medicine

## 2018-01-22 ENCOUNTER — Ambulatory Visit: Payer: 59 | Admitting: Sports Medicine

## 2018-01-22 DIAGNOSIS — F32A Depression, unspecified: Secondary | ICD-10-CM

## 2018-01-22 DIAGNOSIS — L989 Disorder of the skin and subcutaneous tissue, unspecified: Secondary | ICD-10-CM | POA: Diagnosis not present

## 2018-01-22 DIAGNOSIS — F329 Major depressive disorder, single episode, unspecified: Secondary | ICD-10-CM

## 2018-01-22 DIAGNOSIS — F419 Anxiety disorder, unspecified: Secondary | ICD-10-CM

## 2018-01-22 DIAGNOSIS — M11849 Other specified crystal arthropathies, unspecified hand: Secondary | ICD-10-CM

## 2018-01-22 MED ORDER — PREDNISONE 50 MG PO TABS: 50.0000 mg | ORAL_TABLET | Freq: Every day | ORAL | 0 refills | Status: DC

## 2018-01-22 NOTE — Addendum Note (Signed)
Addended by: Silverio Decamp on: 01/22/2018 12:03 PM   Modules accepted: Orders

## 2018-01-22 NOTE — Assessment & Plan Note (Signed)
Currently being treated with leflunomide and Humira with rheumatology. He does desire for me to do all of his procedures. Adding a burst of prednisone, he will take it until the end of the week and to give his arm a wounds time to heal. He is unfortunately losing his insurance and will have to switch to [place] care.

## 2018-01-22 NOTE — Progress Notes (Signed)
   Procedure: Shave biopsy of 1.2 cm left radial forearm suspicious lesion Risks, benefits, and alternatives explained and consent obtained. Time out conducted. Surface prepped with alcohol. 2cc lidocaine with epinephine infiltrated in a field block. Adequate anesthesia ensured. Area prepped and draped in a sterile fashion. Excision performed with: Doing a DermaBlade I shaved the lesion into the deep dermis and then used a Hyfrecator to achieve hemostasis. Pt stable.  Procedure: Shave biopsy of 0.5 cm left dorsal forearm suspicious lesion Risks, benefits, and alternatives explained and consent obtained. Time out conducted. Surface prepped with alcohol. 2cc lidocaine with epinephine infiltrated in a field block. Adequate anesthesia ensured. Area prepped and draped in a sterile fashion. Excision performed with: Doing a DermaBlade I shaved the lesion into the deep dermis and then used a Hyfrecator to achieve hemostasis. Pt stable.

## 2018-01-22 NOTE — Addendum Note (Signed)
Addended by: Beatris Ship L on: 01/22/2018 10:55 AM   Modules accepted: Orders

## 2018-01-22 NOTE — Assessment & Plan Note (Signed)
He had persistent depression on 90 of Cymbalta, continue weight loss. No suicidal or homicidal ideation. He was getting some dissociation, we increase Cymbalta to 120 and add Abilify 5, he feels a little bit too spaced out with the Abilify so discontinuing this for now. Keep the 1 month follow-up which should be sometime first week of December.

## 2018-01-22 NOTE — Assessment & Plan Note (Addendum)
Shave biopsy of #2 skin lesions on the left forearm. Base was hyfrecated for hemostasis.  Both skin lesions were squamous cell carcinomas, they extend to the edge of the excision, because of this we will need to do reexcision on both lesions.

## 2018-01-25 ENCOUNTER — Other Ambulatory Visit: Payer: Self-pay | Admitting: *Deleted

## 2018-02-05 ENCOUNTER — Encounter: Payer: Self-pay | Admitting: Sports Medicine

## 2018-02-05 ENCOUNTER — Other Ambulatory Visit: Payer: Self-pay | Admitting: Sports Medicine

## 2018-02-05 ENCOUNTER — Ambulatory Visit: Payer: 59 | Admitting: Sports Medicine

## 2018-02-05 DIAGNOSIS — L989 Disorder of the skin and subcutaneous tissue, unspecified: Secondary | ICD-10-CM | POA: Diagnosis not present

## 2018-02-05 MED ORDER — ALPRAZOLAM 0.5 MG PO TABS: 0.5000 mg | ORAL_TABLET | Freq: Every day | ORAL | 3 refills | Status: DC | PRN

## 2018-02-05 NOTE — Assessment & Plan Note (Addendum)
Previous lesions were squamous cell carcinomas that did extend to the margin. We are going to do a wide excision of both forearm lesions as well as an additional lesion on the lateral elbow. Return in 2 weeks for suture removal.  The cancers are again noted, the elbow, and mid forearm lesions show clear margins, the distal forearm continues to show persistent, deep cancer at the 3:00 margin, he should return for 1 more full excision, there will be no charge.

## 2018-02-05 NOTE — Progress Notes (Signed)
   Procedure:  Excision of left distal forearm squamous cell carcinoma, 2 cm Risks, benefits, and alternatives explained and consent obtained. Time out conducted. Surface prepped with alcohol. 3cc lidocaine with epinephine infiltrated in a field block. Adequate anesthesia ensured. Area prepped and draped in a sterile fashion. Excision performed with: Longitudinal elliptical incision made around the lesion, I carried the procedure down into the subcutaneous tissues, removed it en bloc, the distal pole was tagged with a suture. #3 3-0 simple interrupted Ethilon sutures were used to close the incision after a bit of tissue undermining to reduce tension.  Wound edges were everted and the wound was dressed. Hemostasis achieved. Pt stable.  Procedure:  Excision of left mid forearm squamous cell carcinoma, 2 cm Risks, benefits, and alternatives explained and consent obtained. Time out conducted. Surface prepped with alcohol. 3cc lidocaine with epinephine infiltrated in a field block. Adequate anesthesia ensured. Area prepped and draped in a sterile fashion. Excision performed with: Longitudinal elliptical incision made around the lesion, I carried the procedure down into the subcutaneous tissues, removed it en bloc, the distal pole was tagged with a suture. #3 3-0 simple interrupted Ethilon sutures were used to close the incision after a bit of tissue undermining to reduce tension.  Wound edges were everted and the wound was dressed. Hemostasis achieved. Pt stable.  Procedure:  Excision of left proximal forearm squamous cell carcinoma, 2 cm Risks, benefits, and alternatives explained and consent obtained. Time out conducted. Surface prepped with alcohol. 3cc lidocaine with epinephine infiltrated in a field block. Adequate anesthesia ensured. Area prepped and draped in a sterile fashion. Excision performed with: Longitudinal elliptical incision made around the lesion, I carried the procedure  down into the subcutaneous tissues, removed it en bloc, the distal pole was tagged with a suture. #3 3-0 simple interrupted Ethilon sutures were used to close the incision after a bit of tissue undermining to reduce tension.  Wound edges were everted and the wound was dressed. Hemostasis achieved. Pt stable.

## 2018-02-05 NOTE — Addendum Note (Signed)
Addended by: Beatris Ship L on: 02/05/2018 11:12 AM   Modules accepted: Orders

## 2018-02-11 ENCOUNTER — Encounter: Payer: Self-pay | Admitting: Sports Medicine

## 2018-02-11 ENCOUNTER — Ambulatory Visit (INDEPENDENT_AMBULATORY_CARE_PROVIDER_SITE_OTHER): Payer: 59 | Admitting: Sports Medicine

## 2018-02-11 DIAGNOSIS — L989 Disorder of the skin and subcutaneous tissue, unspecified: Secondary | ICD-10-CM

## 2018-02-11 NOTE — Progress Notes (Signed)
   Procedure: Reexcision of left distal forearm skin lesion Risks, benefits, and alternatives explained and consent obtained. Time out conducted. Surface prepped with alcohol. 5cc lidocaine with epinephine infiltrated in a field block. Adequate anesthesia ensured. Area prepped and draped in a sterile fashion. Excision performed with: Identified the previous incision with involved margins.  Noted margin involved was 3:00 with previous suture tagged at the distal 6:00 pole.  I removed the existing sutures and made an elliptical incision leaving 1 cm margins.  I carried the incision down into the subcutaneous tissues, removing it en bloc contacting the distal pole at 6:00 again with a suture.  I then closed the incision after mild undermining of the skin edges with #6 3-0 simple interrupted Ethilon sutures. Hemostasis achieved. Pt stable.

## 2018-02-11 NOTE — Assessment & Plan Note (Signed)
Elbow and mid forearm lesions showed clear margins. There was persistent deep cancer at the clear 3:00 margin on the distal lesion, this was reexcised with a 1 cm margin with suture tag at the 6:00 pole. I filled out his disability paperwork today. No charges for any of the above.

## 2018-02-17 ENCOUNTER — Ambulatory Visit: Payer: 59 | Admitting: Sports Medicine

## 2018-02-19 ENCOUNTER — Ambulatory Visit: Payer: 59

## 2018-02-22 ENCOUNTER — Encounter: Payer: Self-pay | Admitting: Osteopathic Medicine

## 2018-02-22 ENCOUNTER — Ambulatory Visit (INDEPENDENT_AMBULATORY_CARE_PROVIDER_SITE_OTHER): Payer: 59 | Admitting: Osteopathic Medicine

## 2018-02-22 VITALS — BP 124/60 | HR 71 | Temp 98.4°F | Wt 176.3 lb

## 2018-02-22 DIAGNOSIS — L989 Disorder of the skin and subcutaneous tissue, unspecified: Secondary | ICD-10-CM

## 2018-02-22 NOTE — Progress Notes (Signed)
Here for suture removal s/p skin biopsies.  Areas cleaned w/ alcohol.  Sutures removed from 3 places on L forearm.  No bleeding. Pt tolerated well.

## 2018-03-02 ENCOUNTER — Encounter: Payer: Self-pay | Admitting: Sports Medicine

## 2018-03-02 DIAGNOSIS — M11849 Other specified crystal arthropathies, unspecified hand: Secondary | ICD-10-CM

## 2018-03-02 MED ORDER — PREDNISONE 5 MG PO TABS: 5.0000 mg | ORAL_TABLET | Freq: Every day | ORAL | 11 refills | Status: DC

## 2018-03-02 NOTE — Assessment & Plan Note (Signed)
Currently being treated with leflunomide and Humira with rheumatology. Desires to do daily prednisone, multiple NSAIDs, multiple injections have all been in efficacious. Discontinue ibuprofen, starting daily prednisone treatment, 5 mg daily to start out with.   We will need to check blood sugar/hemoglobin A1c more often and ultimately we are going to need to do a bone density test.

## 2018-03-18 ENCOUNTER — Encounter: Payer: Self-pay | Admitting: Sports Medicine

## 2018-04-19 ENCOUNTER — Ambulatory Visit: Payer: 59 | Admitting: Sports Medicine

## 2018-05-23 IMAGING — DX DG HAND COMPLETE 3+V*L*
3 series · 3 of 3 positions shown · non-contrast
Comparison: 04/21/2012

CLINICAL DATA: Osteoarthritis. Proximal interphalangeal joint pain.

EXAM:
LEFT HAND - COMPLETE 3+ VIEW

[hand pa]
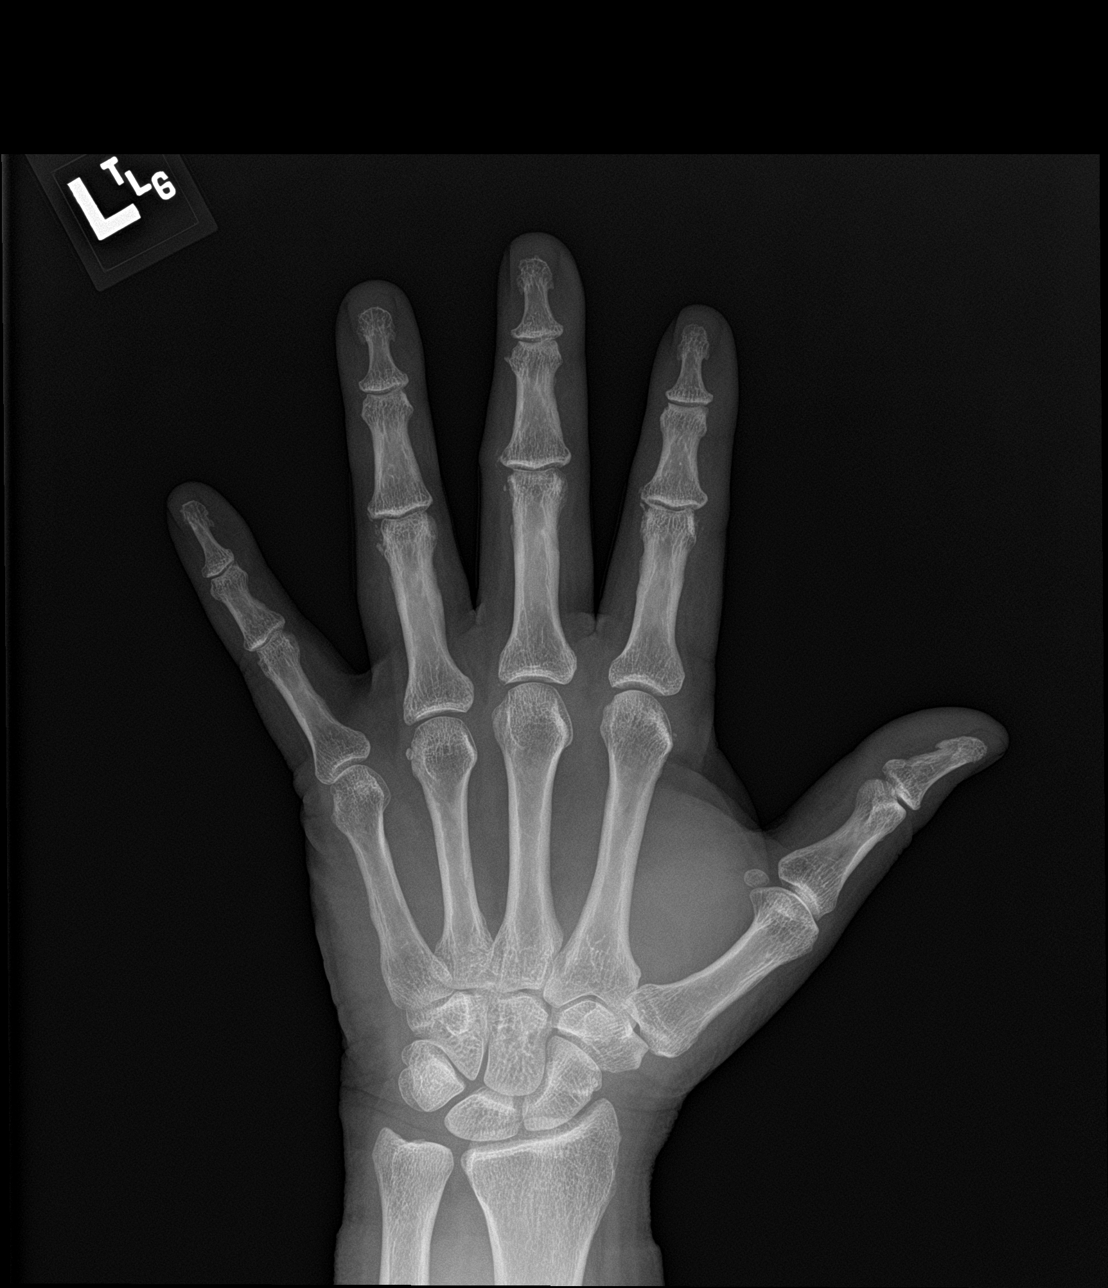

[hand obl]
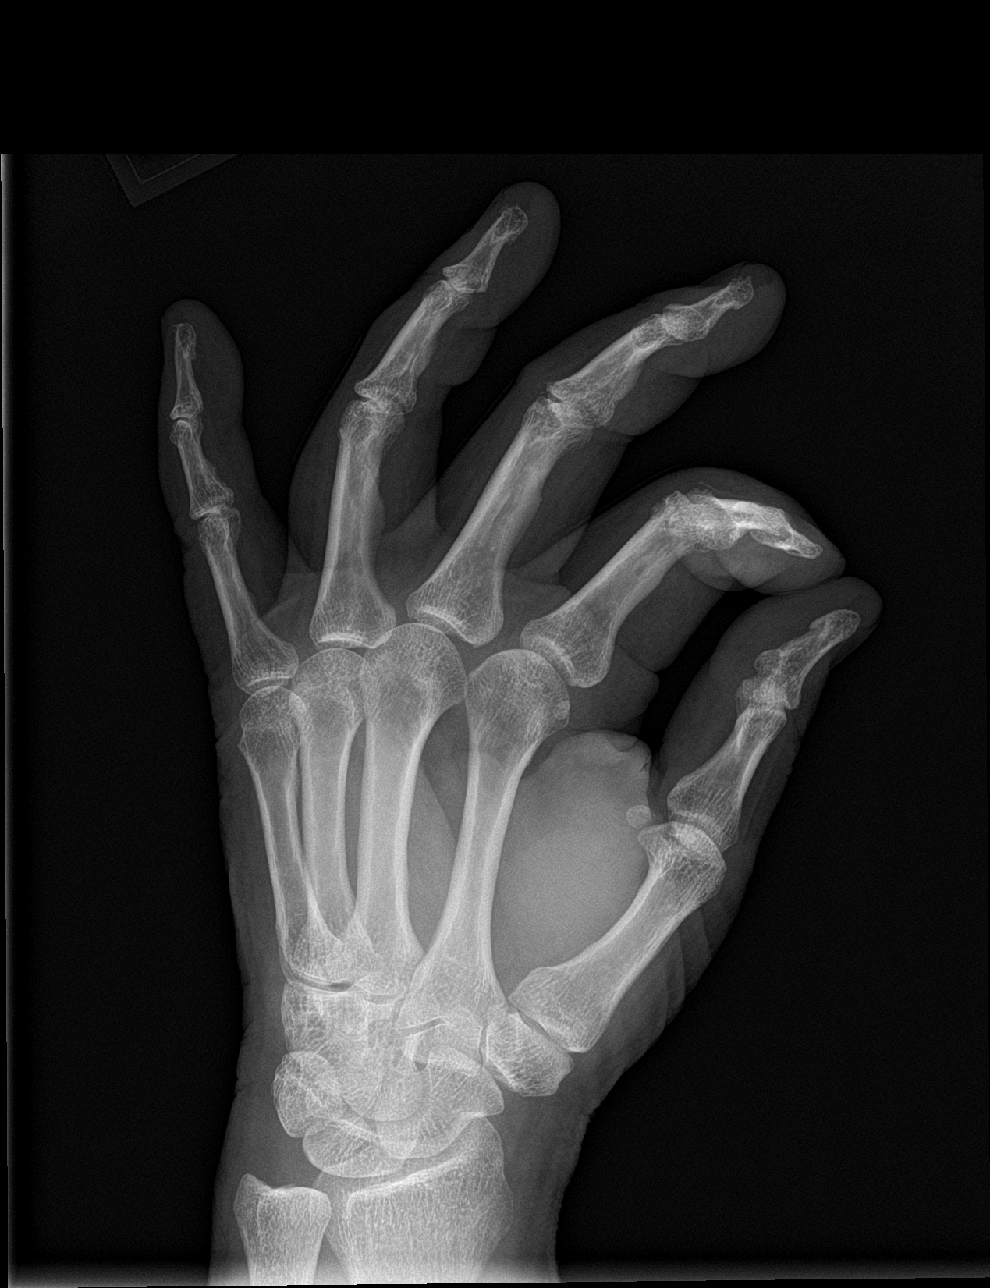

[hand lat]
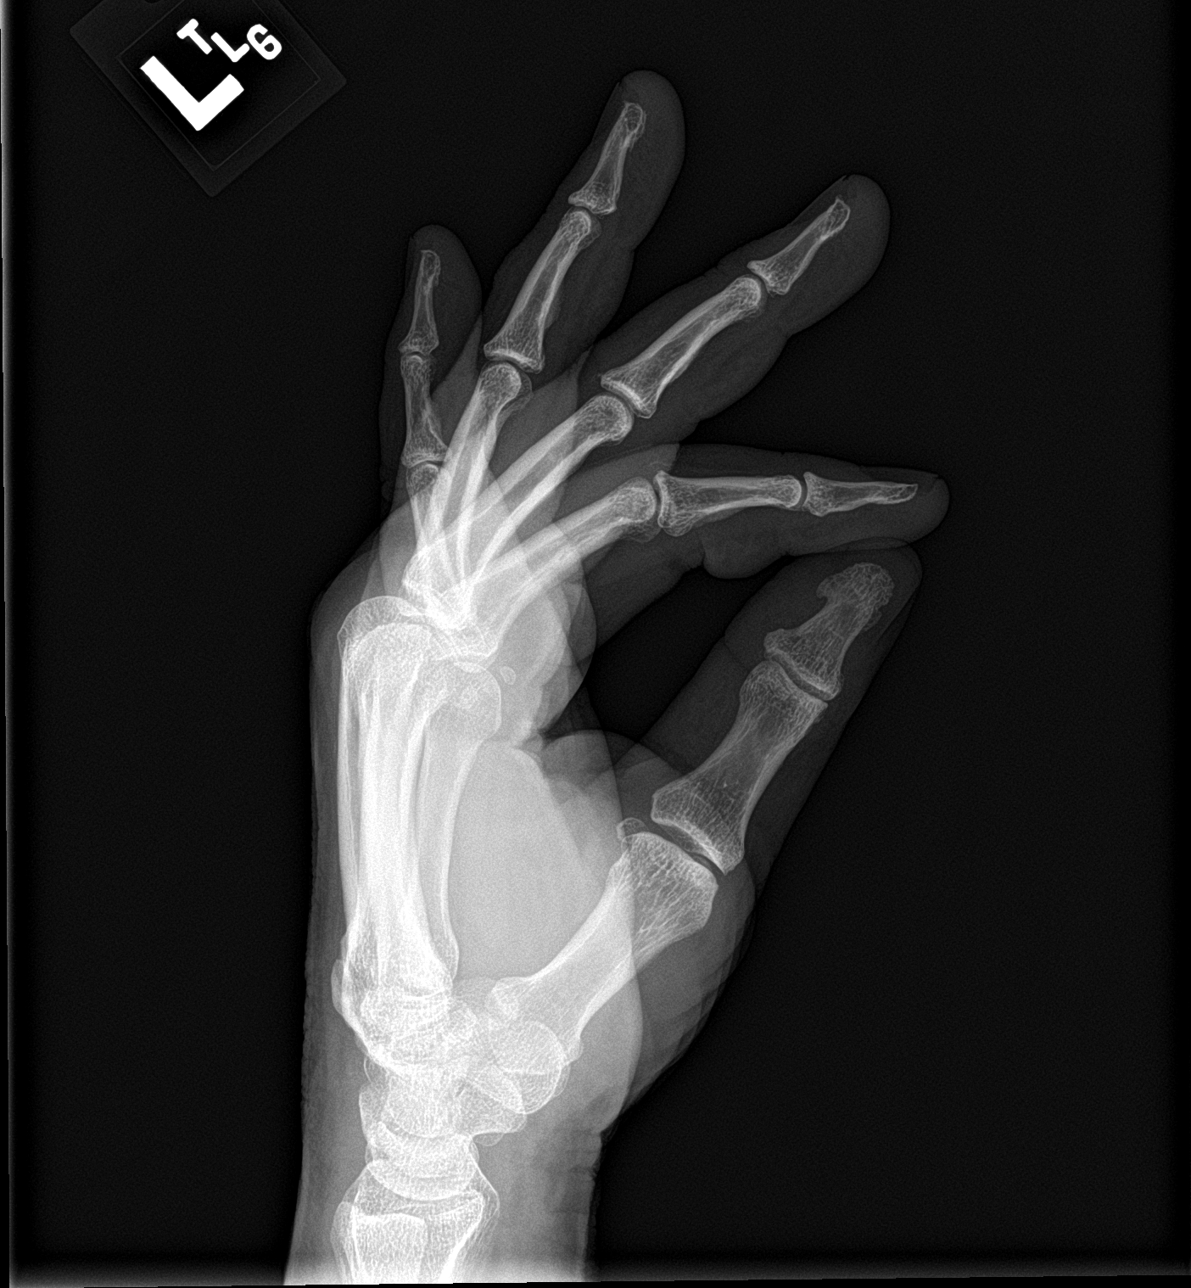

[3 of 3 positions shown; findings below may reference images not displayed]

FINDINGS: Progressive articular calcifications noted along the proximal
interphalangeal joints of the index, middle, and ring fingers.
Marginal erosions are noted along the ulnar side of the head of the
index, middle, and small finger proximal phalanges. Small peripheral
erosions along the heads of the middle phalanges of the index and
middle fingers. These are all new.

No carpal crowding or periarticular bony demineralization. Potential
joint capsule calcifications along the proximal interphalangeal
joints of the index and middle fingers. No acro-osteolysis. No
chondrocalcinosis in the TFCC.
IMPRESSION: 1. New erosions with capsule oral or intracapsular calcifications in
the interphalangeal joints, especially along the proximal
interphalangeal joints as noted above. Appearance compatible with
CPPD arthropathy, hydroxyapatite [HOSPITAL] deposition disease, or
gout.

## 2018-07-22 ENCOUNTER — Encounter: Payer: Self-pay | Admitting: Sports Medicine

## 2018-07-22 DIAGNOSIS — H8113 Benign paroxysmal vertigo, bilateral: Secondary | ICD-10-CM

## 2018-07-22 MED ORDER — DIAZEPAM 5 MG PO TABS: 5.0000 mg | ORAL_TABLET | Freq: Every day | ORAL | 0 refills | Status: DC | PRN

## 2018-07-22 NOTE — Telephone Encounter (Signed)
Patient advised.

## 2018-07-22 NOTE — Telephone Encounter (Signed)
I did it without refills, but no more after that.

## 2019-10-18 ENCOUNTER — Encounter: Payer: Self-pay | Admitting: Sports Medicine

## 2019-10-18 ENCOUNTER — Other Ambulatory Visit: Payer: Self-pay

## 2019-10-18 ENCOUNTER — Ambulatory Visit (INDEPENDENT_AMBULATORY_CARE_PROVIDER_SITE_OTHER): Payer: PPO | Admitting: Sports Medicine

## 2019-10-18 VITALS — BP 144/77 | HR 78 | Ht 70.0 in | Wt 189.0 lb

## 2019-10-18 DIAGNOSIS — F329 Major depressive disorder, single episode, unspecified: Secondary | ICD-10-CM

## 2019-10-18 DIAGNOSIS — F419 Anxiety disorder, unspecified: Secondary | ICD-10-CM

## 2019-10-18 DIAGNOSIS — Z23 Encounter for immunization: Secondary | ICD-10-CM | POA: Diagnosis not present

## 2019-10-18 DIAGNOSIS — Z299 Encounter for prophylactic measures, unspecified: Secondary | ICD-10-CM | POA: Diagnosis not present

## 2019-10-18 DIAGNOSIS — G43809 Other migraine, not intractable, without status migrainosus: Secondary | ICD-10-CM

## 2019-10-18 DIAGNOSIS — F32A Depression, unspecified: Secondary | ICD-10-CM

## 2019-10-18 DIAGNOSIS — E7800 Pure hypercholesterolemia, unspecified: Secondary | ICD-10-CM | POA: Diagnosis not present

## 2019-10-18 MED ORDER — TROKENDI XR 25 MG PO CP24: 1.0000 | ORAL_CAPSULE | Freq: Every day | ORAL | 11 refills | Status: DC

## 2019-10-18 MED ORDER — LORAZEPAM 0.5 MG PO TABS: 0.2500 mg | ORAL_TABLET | Freq: Every day | ORAL | 0 refills | Status: DC | PRN

## 2019-10-18 NOTE — Assessment & Plan Note (Signed)
Annual physical as above, needs pneumococcal 13.

## 2019-10-18 NOTE — Addendum Note (Signed)
Addended by: Beatris Ship L on: 10/18/2019 11:05 AM   Modules accepted: Orders

## 2019-10-18 NOTE — Progress Notes (Addendum)
Subjective:    CC: Annual Physical Exam  HPI:  This patient is here for their annual physical  I reviewed the past medical history, family history, social history, surgical history, and allergies today and no changes were needed.  Please see the problem list section below in epic for further details.  Past Medical History: Past Medical History:  Diagnosis Date  . Arthritis   . BPH (benign prostatic hypertrophy)   . Elevated PSA   . GERD (gastroesophageal reflux disease)   . Lower urinary tract symptoms (LUTS)   . Mild obstructive sleep apnea    pt just had study done--  to start using cpap next week   Past Surgical History: Past Surgical History:  Procedure Laterality Date  . COLONOSCOPY  2011  . CYSTOSCOPY N/A 12/02/2013   Procedure: CYSTOSCOPY;  Surgeon: Festus Aloe, MD;  [facility];  Service: Urology;  Laterality: N/A;  . GREEN LIGHT LASER TURP (TRANSURETHRAL RESECTION OF PROSTATE N/A 01/10/2014   Procedure: GREEN LIGHT LASER TURP (TRANSURETHRAL RESECTION OF PROSTATE;  Surgeon: Festus Aloe, MD;  Location: [facility];  Service: Urology;  Laterality: N/A;  . PROSTATE BIOPSY N/A 12/02/2013   Procedure: BIOPSY TRANSRECTAL ULTRASONIC PROSTATE (TUBP);  Surgeon: Festus Aloe, MD;  Location: [facility];  Service: Urology;  Laterality: N/A;   Social History: Social History   Socioeconomic History  . Marital status: Married    Spouse name: [name]  . Number of children: 1  . Years of education: College  . Highest education level: Not on file  Occupational History    Comment: Administration   Tobacco Use  . Smoking status: Former Smoker    Packs/day: 1.00    Years: 25.00    Pack years: 25.00    Types: Cigarettes    Quit date: 05/01/2009    Years since quitting: 10.4  . Smokeless tobacco: Never Used  . Tobacco comment: Pt uses "vape" rare occasion   Substance and Sexual Activity  . Alcohol use: Yes     Alcohol/week: 0.0 standard drinks    Comment: occasional  . Drug use: No  . Sexual activity: Not on file  Other Topics Concern  . Not on file  Social History Narrative   Drinks about 2 cups of coffee a day    Social Determinants of Health   Financial Resource Strain:   . Difficulty of Paying Living Expenses: Not on file  Food Insecurity:   . Worried About Charity fundraiser in the Last Year: Not on file  . Ran Out of Food in the Last Year: Not on file  Transportation Needs:   . Lack of Transportation (Medical): Not on file  . Lack of Transportation (Non-Medical): Not on file  Physical Activity:   . Days of Exercise per Week: Not on file  . Minutes of Exercise per Session: Not on file  Stress:   . Feeling of Stress : Not on file  Social Connections:   . Frequency of Communication with Friends and Family: Not on file  . Frequency of Social Gatherings with Friends and Family: Not on file  . Attends Religious Services: Not on file  . Active Member of Clubs or Organizations: Not on file  . Attends Archivist Meetings: Not on file  . Marital Status: Not on file   Family History: Family History  Problem Relation Age of Onset  . Cancer Mother        colon  .  Cancer Father        lung   Allergies: Allergies  Allergen Reactions  . Acetaminophen Other (See Comments)    headaches    Medications: See med rec.  Review of Systems: No headache, visual changes, nausea, vomiting, diarrhea, constipation, dizziness, abdominal pain, skin rash, fevers, chills, night sweats, swollen lymph nodes, weight loss, chest pain, body aches, joint swelling, muscle aches, shortness of breath, mood changes, visual or auditory hallucinations.  Objective:    General: Well Developed, well nourished, and in no acute distress.  Neuro: Alert and oriented x3, extra-ocular muscles intact, sensation grossly intact. Cranial nerves II through XII are intact, motor, sensory, and coordinative  functions are all intact. HEENT: Normocephalic, atraumatic, pupils equal round reactive to light, neck supple, no masses, no lymphadenopathy, thyroid nonpalpable. Oropharynx, nasopharynx, external ear canals are unremarkable. Skin: Warm and dry, no rashes noted.  Cardiac: Regular rate and rhythm, no murmurs rubs or gallops.  Respiratory: Clear to auscultation bilaterally. Not using accessory muscles, speaking in full sentences.  Abdominal: Soft, nontender, nondistended, positive bowel sounds, no masses, no organomegaly.  Musculoskeletal: Shoulder, elbow, wrist, hip, knee, ankle stable, and with full range of motion.  Impression and Recommendations:    The patient was counselled, risk factors were discussed, anticipatory guidance given.  Preventive measure Annual physical as above, needs pneumococcal 13.   Vestibular migraine Having clusters of migrainous type headaches over decades. No focal neurologic symptoms. Adding Trokendi low-dose, return in 1 month to reevaluate.  Discount coupon does not work so unable to use Trokendi, he feels as though he can live with the headaches, we could certainly try some other migraine preventative agents such as beta-blockers, as well as medicines like Aimovig and Ajovy.  I can discuss this with him at a future visit.  Anxiety and depression Historically as you Cymbalta with moderate effect, at this point he only desires an as needed anxiety medication, typically used when he plays golf. Shelley is specifically requesting Ativan. We can discuss this again in a month.   ___________________________________________ Gwen Her. Dianah Field, M.D., ABFM., CAQSM. Primary Care and Sports Medicine [facility]  Adjunct Professor of [place] of [facility] of Medicine

## 2019-10-18 NOTE — Assessment & Plan Note (Addendum)
Having clusters of migrainous type headaches over decades. No focal neurologic symptoms. Adding Trokendi low-dose, return in 1 month to reevaluate.  Discount coupon does not work so unable to use Trokendi, he feels as though he can live with the headaches, we could certainly try some other migraine preventative agents such as beta-blockers, as well as medicines like Aimovig and Ajovy.  I can discuss this with him at a future visit.

## 2019-10-18 NOTE — Assessment & Plan Note (Signed)
Historically as you Cymbalta with moderate effect, at this point he only desires an as needed anxiety medication, typically used when he plays golf. Cody Kane is specifically requesting Ativan. We can discuss this again in a month.

## 2019-10-19 DIAGNOSIS — E7800 Pure hypercholesterolemia, unspecified: Secondary | ICD-10-CM | POA: Diagnosis not present

## 2019-10-19 LAB — COMPLETE METABOLIC PANEL WITH GFR
AG Ratio: 1.5 (calc) (ref 1.0–2.5)
ALT: 19 U/L (ref 9–46)
AST: 17 U/L (ref 10–35)
Albumin: 4 g/dL (ref 3.6–5.1)
Alkaline phosphatase (APISO): 65 U/L (ref 35–144)
BUN: 23 mg/dL (ref 7–25)
CO2: 27 mmol/L (ref 20–32)
Calcium: 8.8 mg/dL (ref 8.6–10.3)
Chloride: 106 mmol/L (ref 98–110)
Creat: 0.96 mg/dL (ref 0.70–1.25)
GFR, Est African American: 96 mL/min/{1.73_m2} (ref >=60)
GFR, Est Non African American: 83 mL/min/{1.73_m2} (ref >=60)
Globulin: 2.6 g/dL (calc) (ref 1.9–3.7)
Glucose, Bld: 89 mg/dL (ref 65–99)
Potassium: 4.1 mmol/L (ref 3.5–5.3)
Sodium: 140 mmol/L (ref 135–146)
Total Bilirubin: 0.6 mg/dL (ref 0.2–1.2)
Total Protein: 6.6 g/dL (ref 6.1–8.1)

## 2019-10-19 LAB — TSH: TSH: 0.62 mIU/L (ref 0.40–4.50)

## 2019-10-19 LAB — LIPID PANEL W/REFLEX DIRECT LDL
Cholesterol: 157 mg/dL (ref <200)
HDL: 53 mg/dL (ref >=40)
LDL Cholesterol (Calc): 83 mg/dL (calc)
Non-HDL Cholesterol (Calc): 104 mg/dL (calc) (ref <130)
Total CHOL/HDL Ratio: 3 (calc) (ref <5.0)
Triglycerides: 112 mg/dL (ref <150)

## 2019-10-19 LAB — CBC
HCT: 41.9 % (ref 38.5–50.0)
Hemoglobin: 13.8 g/dL (ref 13.2–17.1)
MCH: 29.4 pg (ref 27.0–33.0)
MCHC: 32.9 g/dL (ref 32.0–36.0)
MCV: 89.1 fL (ref 80.0–100.0)
MPV: 9.3 fL (ref 7.5–12.5)
Platelets: 206 10*3/uL (ref 140–400)
RBC: 4.7 10*6/uL (ref 4.20–5.80)
RDW: 12.4 % (ref 11.0–15.0)
WBC: 7.8 10*3/uL (ref 3.8–10.8)

## 2019-10-20 NOTE — Addendum Note (Signed)
Addended by: Silverio Decamp on: 10/20/2019 02:22 PM   Modules accepted: Orders

## 2019-11-15 ENCOUNTER — Encounter: Payer: Self-pay | Admitting: Sports Medicine

## 2019-11-15 ENCOUNTER — Ambulatory Visit (INDEPENDENT_AMBULATORY_CARE_PROVIDER_SITE_OTHER): Payer: PPO | Admitting: Sports Medicine

## 2019-11-15 DIAGNOSIS — G43809 Other migraine, not intractable, without status migrainosus: Secondary | ICD-10-CM | POA: Diagnosis not present

## 2019-11-15 DIAGNOSIS — M11849 Other specified crystal arthropathies, unspecified hand: Secondary | ICD-10-CM | POA: Diagnosis not present

## 2019-11-15 MED ORDER — RIZATRIPTAN BENZOATE 5 MG PO TBDP: 5.0000 mg | ORAL_TABLET | ORAL | 11 refills | Status: AC | PRN

## 2019-11-15 MED ORDER — BUTALBITAL-ASA-CAFF-CODEINE 50-325-40-30 MG PO CAPS
1.0000 | ORAL_CAPSULE | ORAL | 0 refills | Status: DC | PRN
Start: 2019-11-15 — End: 2020-02-07

## 2019-11-15 NOTE — Progress Notes (Signed)
° ° °  Procedures performed today:    None.  Independent interpretation of notes and tests performed by another provider:   None.  Brief History, Exam, Impression, and Recommendations:    Vestibular migraine Cody Kane has a few migraines a month, Trokendi was too expensive, and he did not like the side effects from topiramate standard release. We are going to try low-dose Maxalt for now. I am also when to add some Fiorinal with codeine for severe pain/head of note he aches. Of note he did have a clear brain MRI in 2016.     ___________________________________________ Gwen Her. Dianah Field, M.D., ABFM., CAQSM. Primary Care and [place] Instructor of [place] of [facility] of Medicine

## 2019-11-15 NOTE — Assessment & Plan Note (Addendum)
Cody Kane has a few migraines a month, Trokendi was too expensive, and he did not like the side effects from topiramate standard release. We are going to try low-dose Maxalt for now. I am also when to add some Fiorinal with codeine for severe pain/head of note he aches. Of note he did have a clear brain MRI in 2016.

## 2019-12-27 ENCOUNTER — Ambulatory Visit (INDEPENDENT_AMBULATORY_CARE_PROVIDER_SITE_OTHER): Payer: PPO

## 2019-12-27 ENCOUNTER — Other Ambulatory Visit: Payer: Self-pay

## 2019-12-27 ENCOUNTER — Ambulatory Visit (INDEPENDENT_AMBULATORY_CARE_PROVIDER_SITE_OTHER): Payer: PPO | Admitting: Sports Medicine

## 2019-12-27 DIAGNOSIS — M545 Low back pain, unspecified: Secondary | ICD-10-CM | POA: Diagnosis not present

## 2019-12-27 DIAGNOSIS — M51369 Other intervertebral disc degeneration, lumbar region without mention of lumbar back pain or lower extremity pain: Secondary | ICD-10-CM | POA: Insufficient documentation

## 2019-12-27 DIAGNOSIS — M5136 Other intervertebral disc degeneration, lumbar region: Secondary | ICD-10-CM | POA: Diagnosis not present

## 2019-12-27 MED ORDER — PREDNISONE 50 MG PO TABS: ORAL_TABLET | ORAL | 0 refills | Status: DC

## 2019-12-27 MED ORDER — ACETAMINOPHEN-CODEINE #3 300-30 MG PO TABS: 1.0000 | ORAL_TABLET | ORAL | 0 refills | Status: DC | PRN

## 2019-12-27 NOTE — Assessment & Plan Note (Signed)
Cody Kane is a very pleasant 64 year old male, he has a long history of years of low back pain, worsened after taking out some tiles from his bathroom. He was seen by a couple of other providers and given some Band-Aid treatments. Pain is axial, discogenic without any radicular pain. No red flags. I explained the anatomy, evolution of the back and mammals, as well as the treatment protocol. Adding physical therapy, 5 days of prednisone, after the 5 days he will drop back down to his 5 mg daily dose. Happy to refill his Tylenol 3. X-rays. Return to see me 6 weeks, MRI for interventional planning if no better.

## 2019-12-27 NOTE — Progress Notes (Signed)
    Procedures performed today:    None.  Independent interpretation of notes and tests performed by another provider:   None.  Brief History, Exam, Impression, and Recommendations:    Lumbar degenerative disc disease Cody Kane is a very pleasant 64 year old male, he has a long history of years of low back pain, worsened after taking out some tiles from his bathroom. He was seen by a couple of other providers and given some Band-Aid treatments. Pain is axial, discogenic without any radicular pain. No red flags. I explained the anatomy, evolution of the back and mammals, as well as the treatment protocol. Adding physical therapy, 5 days of prednisone, after the 5 days he will drop back down to his 5 mg daily dose. Happy to refill his Tylenol 3. X-rays. Return to see me 6 weeks, MRI for interventional planning if no better.    ___________________________________________ Gwen Her. Dianah Field, M.D., ABFM., CAQSM. Primary Care and [place] Instructor of [place] of [facility] of Medicine

## 2020-01-09 ENCOUNTER — Encounter: Payer: Self-pay | Admitting: Rehabilitative and Restorative Service Providers"

## 2020-01-09 ENCOUNTER — Ambulatory Visit (INDEPENDENT_AMBULATORY_CARE_PROVIDER_SITE_OTHER): Payer: PPO | Admitting: Rehabilitative and Restorative Service Providers"

## 2020-01-09 ENCOUNTER — Other Ambulatory Visit: Payer: Self-pay

## 2020-01-09 DIAGNOSIS — R29898 Other symptoms and signs involving the musculoskeletal system: Secondary | ICD-10-CM | POA: Diagnosis not present

## 2020-01-09 DIAGNOSIS — G8929 Other chronic pain: Secondary | ICD-10-CM

## 2020-01-09 DIAGNOSIS — R293 Abnormal posture: Secondary | ICD-10-CM

## 2020-01-09 DIAGNOSIS — M545 Low back pain, unspecified: Secondary | ICD-10-CM | POA: Diagnosis not present

## 2020-01-09 NOTE — Patient Instructions (Addendum)
°  Access Code: [email or web address]: [email or web address]: 11/08/2021Prepared by: Armonte Tortorella HoltExercises  Prone Press Up - 2 x daily - 7 x weekly - 1 sets - 10 reps - 2-3 sec hold  Prone Quadriceps Stretch with Strap - 2 x daily - 7 x weekly - 1 sets - 3 reps - 30 sec hold  Supine Piriformis Stretch with Leg Straight - 2 x daily - 7 x weekly - 1 sets - 3 reps - 30 sec hold  Standing Lumbar Extension - 2 x daily - 7 x weekly - 1 sets - 2-3 reps - 2-3 sec hold Patient Education  Corporate treasurer Posture

## 2020-01-09 NOTE — Therapy (Signed)
[place], [place], [address] Phone: [phone]   Fax:  [phone]  Physical Therapy Treatment  Patient Details  Name: Cody Kane MRN: [id number] Date of Birth: 05-17-1955 Referring Provider (PT): Dr Dianah Field   Encounter Date: 01/09/2020   PT End of Session - 01/09/20 1704    Visit Number 1    Number of Visits 6    Date for PT Re-Evaluation 02/20/20    PT Start Time 3710    PT Stop Time 1608    PT Time Calculation (min) 55 min    Activity Tolerance Patient tolerated treatment well           Past Medical History:  Diagnosis Date  . Arthritis   . BPH (benign prostatic hypertrophy)   . Elevated PSA   . GERD (gastroesophageal reflux disease)   . Lower urinary tract symptoms (LUTS)   . Mild obstructive sleep apnea    pt just had study done--  to start using cpap next week    Past Surgical History:  Procedure Laterality Date  . COLONOSCOPY  2011  . CYSTOSCOPY N/A 12/02/2013   Procedure: CYSTOSCOPY;  Surgeon: Festus Aloe, MD;  Location: [facility];  Service: Urology;  Laterality: N/A;  . GREEN LIGHT LASER TURP (TRANSURETHRAL RESECTION OF PROSTATE N/A 01/10/2014   Procedure: GREEN LIGHT LASER TURP (TRANSURETHRAL RESECTION OF PROSTATE;  Surgeon: Festus Aloe, MD;  Location: [facility];  Service: Urology;  Laterality: N/A;  . PROSTATE BIOPSY N/A 12/02/2013   Procedure: BIOPSY TRANSRECTAL ULTRASONIC PROSTATE (TUBP);  Surgeon: Festus Aloe, MD;  Location: [facility];  Service: Urology;  Laterality: N/A;    There were no vitals filed for this visit.   Subjective Assessment - 01/09/20 1516    Subjective Patient reports that he has had LBP for the past year following taking up tile in his bathroom. The back pain has persisted for with some days better than others with stiffness every day and pain about 3 days a week.    Pertinent  History restless syndrome; arthritis; GERD; vestibular migraines; Lt elbow pain; Duyuptren's contracture    Patient Stated Goals get some relief for back pain    Currently in Pain? Yes    Pain Score 3     Pain Location Back    Pain Orientation Right;Left;Lower    Pain Descriptors / Indicators Tightness   Rt side sharp/tight; Lt dull   Pain Type Chronic pain    Pain Radiating Towards into butt on Rt    Pain Onset More than a month ago    Pain Frequency Intermittent    Aggravating Factors  lifting; sitting > 10 min; standing > 5 min; walking > 20 min    Pain Relieving Factors meds; heat              [facility] PT Assessment - 01/09/20 0001      Assessment   Medical Diagnosis LBP; lumbar DDD    Referring Provider (PT) Dr Dianah Field    Onset Date/Surgical Date 01/02/20    Hand Dominance Right    Next MD Visit 02/07/20    Prior Therapy Rt hand - Dupuytren's contraction; vestibular rehab; Lt elbow ~ 15 yrs ago       Precautions   Precautions None      Restrictions   Weight Bearing Restrictions No      Balance Screen   Has the patient fallen in the past 6  months Yes    How many times? 1   due to dizzyness    Has the patient had a decrease in activity level because of a fear of falling?  No    Is the patient reluctant to leave their home because of a fear of falling?  No      Prior Function   Level of Independence Independent      Observation/Other Assessments   Focus on Therapeutic Outcomes (FOTO)  55% limitation       Sensation   Additional Comments WFL's per pt report       Posture/Postural Control   Posture Comments sits with trunk flexed and rounded, slumped in chair; stands with head forward; shoulders rounded; flattened lumbar spine       AROM   Lumbar Flexion 95%    Lumbar Extension 15% discomfort    Lumbar - Right Side Bend 55% pinchRt LB    Lumbar - Left Side Bend 50% discomfort Lt LB    Lumbar - Right Rotation 35% discomfort Rt LB    Lumbar - Left Rotation  35%       Strength   Overall Strength Comments WFL's functionally not tested resistively       Flexibility   Hamstrings tight ~ 75-80 deg bilat    Quadriceps tight ~ 90 deg bilat     ITB WFL's     Piriformis tigth Rt > Lt       Palpation   Spinal mobility hypomobile with pain with PA mobs lumbar spine     Palpation comment muscular tightness through the hip flexors Rt > Lt; lumbar paraspinals; QL; lats; gluts; piriformis       Special Tests   Other special tests (-) SLR       Ambulation/Gait   Gait Comments forward flexed with shoulders rounded; LE in ER Lt > Rt                          [facility] Adult PT Treatment/Exercise - 01/09/20 0001      Self-Care   Self-Care Other Self-Care Comments    Other Self-Care Comments  suggested patient use TENS he has at home       Therapeutic Activites    Therapeutic Activities Other Therapeutic Activities    Other Therapeutic Activities suggested modifications for sitting positions       Neuro Re-ed    Neuro Re-ed Details  working on posture and alignment in standing       Lumbar Exercises: Stretches   Hip Flexor Stretch Right;Left;2 reps;30 seconds   seated    Standing Extension 3 reps;5 seconds    Press Ups 10 reps   2-3 sec hold to pt tolerance    Quad Stretch Right;Left;2 reps;30 seconds   prone with strap    Piriformis Stretch Right;Left;2 reps;30 seconds   supine travell with strap                       PT Long Term Goals - 01/09/20 1711      PT LONG TERM GOAL #1   Title Improve posture and alignment in static positions - pt to demonstrate improve sitting posture and alignment    Time 6    Period Weeks    Status New    Target Date 02/20/20      PT LONG TERM GOAL #2   Title Increase LE mobility/ROM/tissue extensibility in hip flexors  and quads    Time 6    Period Weeks    Status New    Target Date 02/20/20      PT LONG TERM GOAL #3   Title Patient reports 50-75% decreased frequency,  intensity and duration of LBP and pain into the buttocks    Time 6    Period Weeks    Status New    Target Date 02/20/20      PT LONG TERM GOAL #4   Title Independent in HEP    Time 6    Period Weeks    Status New    Target Date 02/20/20      PT LONG TERM GOAL #5   Title Improve FOTO to </= 46% limitation    Time 6    Period Weeks    Status New    Target Date 02/20/20                 Plan - 01/09/20 1705    Clinical Impression Statement Patient presents with ~ 1 year history of LBP which occured after he worked tearing out tile in his bathroom. Pain has persisted on an intermittent basis. He has poor posture and alignment; limited trunk and LE mobility/ROM; muscular tightness to palpation; hypomobility through the lumbar spine; poor movement patterns; core weakness; decreased activity level. Patient will benefit from PT to address problems identified.    Stability/Clinical Decision Making Stable/Uncomplicated    Clinical Decision Making Low    Rehab Potential Good    PT Frequency 1x / week    PT Duration 6 weeks    PT Treatment/Interventions ADLs/Self Care Home Management;Aquatic Therapy;Cryotherapy;Electrical Stimulation;Iontophoresis 4mg /ml Dexamethasone;Moist Heat;Ultrasound;Functional mobility training;Therapeutic activities;Therapeutic exercise;Balance training;Neuromuscular re-education;Patient/family education;Manual techniques;Passive range of motion;Dry needling;Taping    PT Next Visit Plan review HEP; progress with stretching and strengthening; core stabilization; lumbar PA mobs; doorway stretch; TB exercises standing; sit to stand core engaged; hip flexor stretch standing; discuss activity level(currently walks dog but otherwise seems to be sedentary); postural strengtheing    PT Home Exercise Plan [email or web address]    Consulted and Agree with Plan of Care Patient           Patient will benefit from skilled therapeutic intervention in order to improve the following  deficits and impairments:     Visit Diagnosis: Chronic bilateral low back pain without sciatica - Plan: PT plan of care cert/re-cert  Other symptoms and signs involving the musculoskeletal system - Plan: PT plan of care cert/re-cert  Abnormal posture - Plan: PT plan of care cert/re-cert     Problem List Patient Active Problem List   Diagnosis Date Noted  . Lumbar degenerative disc disease 12/27/2019  . Arm skin lesion, left 01/08/2018  . Non-healing skin lesion of nose 09/14/2017  . Photosensitivity dermatitis 08/17/2017  . Cubital tunnel syndrome, left 12/23/2016  . Left hip pain 02/01/2016  . Anxiety and depression 07/19/2015  . Osteopenia 05/10/2015  . Vestibular migraine 01/17/2015  . Insomnia 03/09/2014  . Sinusitis, chronic 12/15/2013  . Herpes genitalis 07/22/2012  . Hyperlipidemia 07/22/2012  . Preventive measure 07/21/2012  . Obstructive uropathy 07/21/2012  . History of smoking 07/21/2012  . Primary osteoarthritis of right shoulder 03/24/2012  . 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 08/19/2011  . GERD (gastroesophageal reflux disease) 08/19/2011    Romyn Boswell Nilda Simmer PT, MPH  01/09/2020, 5:16 PM  [facility] [place], [place], [address] Phone: [phone]  Fax:  [phone]  Name: Staley Lunz MRN: [id number] Date of Birth: 1955/12/16

## 2020-01-18 ENCOUNTER — Other Ambulatory Visit: Payer: Self-pay

## 2020-01-18 ENCOUNTER — Encounter: Payer: PPO | Admitting: Rehabilitative and Restorative Service Providers"

## 2020-01-18 ENCOUNTER — Ambulatory Visit (INDEPENDENT_AMBULATORY_CARE_PROVIDER_SITE_OTHER): Payer: PPO | Admitting: Rehabilitative and Restorative Service Providers"

## 2020-01-18 ENCOUNTER — Encounter: Payer: Self-pay | Admitting: Rehabilitative and Restorative Service Providers"

## 2020-01-18 DIAGNOSIS — R293 Abnormal posture: Secondary | ICD-10-CM | POA: Diagnosis not present

## 2020-01-18 DIAGNOSIS — R29898 Other symptoms and signs involving the musculoskeletal system: Secondary | ICD-10-CM | POA: Diagnosis not present

## 2020-01-18 DIAGNOSIS — G8929 Other chronic pain: Secondary | ICD-10-CM | POA: Diagnosis not present

## 2020-01-18 DIAGNOSIS — M545 Low back pain, unspecified: Secondary | ICD-10-CM | POA: Diagnosis not present

## 2020-01-18 NOTE — Therapy (Signed)
[place], [place], [address] Phone: [phone]   Fax:  [phone]  Physical Therapy Treatment  Patient Details  Name: Cody Kane MRN: [id number] Date of Birth: [birth year] Referring Provider (PT): Dr Dianah Field   Encounter Date: 01/18/2020   PT End of Session - 01/18/20 0718    Visit Number 2    Number of Visits 6    Date for PT Re-Evaluation 02/20/20    PT Start Time 0717    PT Stop Time 0800    PT Time Calculation (min) 43 min    Activity Tolerance Patient tolerated treatment well           Past Medical History:  Diagnosis Date  . Arthritis   . BPH (benign prostatic hypertrophy)   . Elevated PSA   . GERD (gastroesophageal reflux disease)   . Lower urinary tract symptoms (LUTS)   . Mild obstructive sleep apnea    pt just had study done--  to start using cpap next week    Past Surgical History:  Procedure Laterality Date  . COLONOSCOPY  2011  . CYSTOSCOPY N/A 12/02/2013   Procedure: CYSTOSCOPY;  Surgeon: Festus Aloe, MD;  Location: [facility];  Service: Urology;  Laterality: N/A;  . GREEN LIGHT LASER TURP (TRANSURETHRAL RESECTION OF PROSTATE N/A 01/10/2014   Procedure: GREEN LIGHT LASER TURP (TRANSURETHRAL RESECTION OF PROSTATE;  Surgeon: Festus Aloe, MD;  Location: [facility];  Service: Urology;  Laterality: N/A;  . PROSTATE BIOPSY N/A 12/02/2013   Procedure: BIOPSY TRANSRECTAL ULTRASONIC PROSTATE (TUBP);  Surgeon: Festus Aloe, MD;  Location: [facility];  Service: Urology;  Laterality: N/A;    There were no vitals filed for this visit.   Subjective Assessment - 01/18/20 0720    Subjective Patient reports that he is having stiffness through the LB. Has been working on his exercises at home and trying to walk more. Has been using the TENs unit at home. More stiff in the morning.    Currently in Pain? Yes    Pain  Score 3     Pain Location Back    Pain Orientation Right;Left;Lower    Pain Descriptors / Indicators Tightness    Pain Type Chronic pain    Pain Radiating Towards less into Rt butt    Pain Onset More than a month ago    Pain Frequency Intermittent                             [facility] Adult PT Treatment/Exercise - 01/18/20 0001      Lumbar Exercises: Stretches   Lower Trunk Rotation 3 reps;10 seconds   discomfort when going to the Lt    Hip Flexor Stretch Right;Left;2 reps;30 seconds   seated    Standing Extension 3 reps;5 seconds    Press Ups 10 reps   2-3 sec hold to pt tolerance    Quad Stretch Right;Left;2 reps;30 seconds   prone with strap    Piriformis Stretch Right;Left;2 reps;30 seconds   supine travell with strap      Lumbar Exercises: Aerobic   Recumbent Bike L2 x 5 min       Manual Therapy   Manual therapy comments skilled palpation to assess tissue response to DN and manual work     Joint Mobilization PA mobs through lumbar and lower thoracic spine Grade II/III    Soft tissue mobilization bilat  thoraco-lumbar paraspinals; Rt piriformis and posterior hip     Myofascial Release thoracic and lumbar spine             Trigger Point Dry Needling - 01/18/20 0001    Consent Given? Yes    Education Handout Provided Yes    Other Dry Needling hip Rt only     Gluteus Medius Response Palpable increased muscle length    Gluteus Maximus Response Palpable increased muscle length    Piriformis Response Palpable increased muscle length    Erector spinae Response Palpable increased muscle length   lower thoracic bilat                PT Education - 01/18/20 0758    Education Details HEP    Person(s) Educated Patient    Methods Explanation;Demonstration;Tactile cues;Verbal cues;Handout    Comprehension Verbalized understanding;Returned demonstration;Verbal cues required;Tactile cues required               PT Long Term Goals - 01/09/20 1711       PT LONG TERM GOAL #1   Title Improve posture and alignment in static positions - pt to demonstrate improve sitting posture and alignment    Time 6    Period Weeks    Status New    Target Date 02/20/20      PT LONG TERM GOAL #2   Title Increase LE mobility/ROM/tissue extensibility in hip flexors and quads    Time 6    Period Weeks    Status New    Target Date 02/20/20      PT LONG TERM GOAL #3   Title Patient reports 50-75% decreased frequency, intensity and duration of LBP and pain into the buttocks    Time 6    Period Weeks    Status New    Target Date 02/20/20      PT LONG TERM GOAL #4   Title Independent in HEP    Time 6    Period Weeks    Status New    Target Date 02/20/20      PT LONG TERM GOAL #5   Title Improve FOTO to </= 46% limitation    Time 6    Period Weeks    Status New    Target Date 02/20/20                 Plan - 01/18/20 9379    Clinical Impression Statement Patient reports increased activity - now doing some walking. He has done his exercises at home. Trial of DN bilat lower thoracic paraspinals and Rt posterior hip.    Rehab Potential Good    PT Frequency 1x / week    PT Duration 6 weeks    PT Treatment/Interventions ADLs/Self Care Home Management;Aquatic Therapy;Cryotherapy;Electrical Stimulation;Iontophoresis 4mg /ml Dexamethasone;Moist Heat;Ultrasound;Functional mobility training;Therapeutic activities;Therapeutic exercise;Balance training;Neuromuscular re-education;Patient/family education;Manual techniques;Passive range of motion;Dry needling;Taping    PT Next Visit Plan review HEP; progress with stretching and strengthening; core stabilization; lumbar PA mobs; doorway stretch; TB exercises standing; sit to stand core engaged; hip flexor stretch standing; discuss activity level(currently walks dog but otherwise seems to be sedentary); postural strengtheing    PT Home Exercise Plan [email or web address]    Consulted and Agree with Plan of Care Patient            Patient will benefit from skilled therapeutic intervention in order to improve the following deficits and impairments:     Visit Diagnosis: Chronic bilateral low back pain without sciatica  Other symptoms and signs involving the musculoskeletal system  Abnormal posture     Problem List Patient Active Problem List   Diagnosis Date Noted  . Lumbar degenerative disc disease 12/27/2019  . Arm skin lesion, left 01/08/2018  . Non-healing skin lesion of nose 09/14/2017  . Photosensitivity dermatitis 08/17/2017  . Cubital tunnel syndrome, left 12/23/2016  . Left hip pain 02/01/2016  . Anxiety and depression 07/19/2015  . Osteopenia 05/10/2015  . Vestibular migraine 01/17/2015  . Insomnia 03/09/2014  . Sinusitis, chronic 12/15/2013  . Herpes genitalis 07/22/2012  . Hyperlipidemia 07/22/2012  . Preventive measure 07/21/2012  . Obstructive uropathy 07/21/2012  . History of smoking 07/21/2012  . Primary osteoarthritis of right shoulder 03/24/2012  . 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 08/19/2011  . GERD (gastroesophageal reflux disease) 08/19/2011    Kamilah Correia Nilda Simmer PT, MPH  01/18/2020, 8:00 AM  [facility] [place], [place], [address] Phone: [phone]   Fax:  [phone]  Name: Eddi Hymes MRN: [id number] Date of Birth: 08-26-1955

## 2020-01-18 NOTE — Patient Instructions (Signed)
Access Code: [email or web address]: [email or web address]: 11/17/2021Prepared by: Benz Vandenberghe HoltExercises  Prone Press Up - 2 x daily - 7 x weekly - 1 sets - 10 reps - 2-3 sec hold  Prone Quadriceps Stretch with Strap - 2 x daily - 7 x weekly - 1 sets - 3 reps - 30 sec hold  Supine Piriformis Stretch with Leg Straight - 2 x daily - 7 x weekly - 1 sets - 3 reps - 30 sec hold  Standing Lumbar Extension - 2 x daily - 7 x weekly - 1 sets - 2-3 reps - 2-3 sec hold  Supine Lower Trunk Rotation - 2 x daily - 7 x weekly - 1 sets - 3-5 reps - 10-15 sec hold Patient Education  Trigger Point Dry Needling

## 2020-01-25 ENCOUNTER — Encounter: Payer: PPO | Admitting: Rehabilitative and Restorative Service Providers"

## 2020-02-01 ENCOUNTER — Ambulatory Visit (INDEPENDENT_AMBULATORY_CARE_PROVIDER_SITE_OTHER): Payer: PPO | Admitting: Physical Therapy

## 2020-02-01 ENCOUNTER — Encounter: Payer: Self-pay | Admitting: Physical Therapy

## 2020-02-01 ENCOUNTER — Other Ambulatory Visit: Payer: Self-pay

## 2020-02-01 DIAGNOSIS — M545 Low back pain, unspecified: Secondary | ICD-10-CM | POA: Diagnosis not present

## 2020-02-01 DIAGNOSIS — R29898 Other symptoms and signs involving the musculoskeletal system: Secondary | ICD-10-CM

## 2020-02-01 DIAGNOSIS — G8929 Other chronic pain: Secondary | ICD-10-CM

## 2020-02-01 DIAGNOSIS — R293 Abnormal posture: Secondary | ICD-10-CM | POA: Diagnosis not present

## 2020-02-01 NOTE — Therapy (Signed)
[place], [place], [address] Phone: [phone]   Fax:  [phone]  Physical Therapy Treatment  Patient Details  Name: Cody Kane MRN: [id number] Date of Birth: 1956/01/01 Referring Provider (PT): Dr Dianah Field   Encounter Date: 02/01/2020   PT End of Session - 02/01/20 1232    Visit Number 3    Number of Visits 6    Date for PT Re-Evaluation 02/20/20    PT Start Time 5400    PT Stop Time 1228    PT Time Calculation (min) 39 min    Activity Tolerance Patient tolerated treatment well    Behavior During Therapy [facility] for tasks assessed/performed           Past Medical History:  Diagnosis Date  . Arthritis   . BPH (benign prostatic hypertrophy)   . Elevated PSA   . GERD (gastroesophageal reflux disease)   . Lower urinary tract symptoms (LUTS)   . Mild obstructive sleep apnea    pt just had study done--  to start using cpap next week    Past Surgical History:  Procedure Laterality Date  . COLONOSCOPY  2011  . CYSTOSCOPY N/A 12/02/2013   Procedure: CYSTOSCOPY;  Surgeon: Festus Aloe, MD;  Location: [facility];  Service: Urology;  Laterality: N/A;  . GREEN LIGHT LASER TURP (TRANSURETHRAL RESECTION OF PROSTATE N/A 01/10/2014   Procedure: GREEN LIGHT LASER TURP (TRANSURETHRAL RESECTION OF PROSTATE;  Surgeon: Festus Aloe, MD;  Location: [facility];  Service: Urology;  Laterality: N/A;  . PROSTATE BIOPSY N/A 12/02/2013   Procedure: BIOPSY TRANSRECTAL ULTRASONIC PROSTATE (TUBP);  Surgeon: Festus Aloe, MD;  Location: [facility];  Service: Urology;  Laterality: N/A;    There were no vitals filed for this visit.   Subjective Assessment - 02/01/20 1150    Subjective My exercises are going OK, my pain has moved from the lower back to the middle. I had a lot of spasms back there one day that worked their way up. Feels OK at the  moment. I strained my right leg doing a hamstring stretch.    Pertinent History restless syndrome; arthritis; GERD; vestibular migraines; Lt elbow pain; Duyuptren's contracture    Patient Stated Goals get some relief for back pain    Currently in Pain? No/denies                             [facility] Adult PT Treatment/Exercise - 02/01/20 0001      Lumbar Exercises: Stretches   Double Knee to Chest Stretch 3 reps;30 seconds    Lower Trunk Rotation 3 reps;30 seconds    Lower Trunk Rotation Limitations core set     Piriformis Stretch Right;Left;3 reps;30 seconds    Other Lumbar Stretch Exercise thoracic extension stretch over towel 1x60 hold, then UE flexion x10       Lumbar Exercises: Seated   Other Seated Lumbar Exercises thoracic excursions 1x10 all directions       Lumbar Exercises: Supine   Bent Knee Raise 10 reps    Bent Knee Raise Limitations with core set     Bridge 10 reps    Straight Leg Raise 10 reps    Straight Leg Raises Limitations eccentric 3 second lower                   PT Education - 02/01/20 1231    Education  Details exercise form, re-assess and possible DC next session    Person(s) Educated Patient    Methods Explanation;Demonstration    Comprehension Verbalized understanding;Returned demonstration               PT Long Term Goals - 01/09/20 1711      PT LONG TERM GOAL #1   Title Improve posture and alignment in static positions - pt to demonstrate improve sitting posture and alignment    Time 6    Period Weeks    Status New    Target Date 02/20/20      PT LONG TERM GOAL #2   Title Increase LE mobility/ROM/tissue extensibility in hip flexors and quads    Time 6    Period Weeks    Status New    Target Date 02/20/20      PT LONG TERM GOAL #3   Title Patient reports 50-75% decreased frequency, intensity and duration of LBP and pain into the buttocks    Time 6    Period Weeks    Status New    Target Date 02/20/20       PT LONG TERM GOAL #4   Title Independent in HEP    Time 6    Period Weeks    Status New    Target Date 02/20/20      PT LONG TERM GOAL #5   Title Improve FOTO to </= 46% limitation    Time 6    Period Weeks    Status New    Target Date 02/20/20                 Plan - 02/01/20 1232    Clinical Impression Statement Continued focus on lumbar flexibility/mobility, also introduced thoracic mobility as well as he reports that his pain has somewhat moved up towards his mid back. Increased focus on core strengthening today as well, continued to encourage regular exercise and activity as well. Seems to be progressing well, reports he is generally functional close to where he'd like to be. Plan for re-assess and possible DC next session.    Stability/Clinical Decision Making Stable/Uncomplicated    Clinical Decision Making Low    Rehab Potential Good    PT Frequency 1x / week    PT Duration 6 weeks    PT Treatment/Interventions ADLs/Self Care Home Management;Aquatic Therapy;Cryotherapy;Electrical Stimulation;Iontophoresis 4mg /ml Dexamethasone;Moist Heat;Ultrasound;Functional mobility training;Therapeutic activities;Therapeutic exercise;Balance training;Neuromuscular re-education;Patient/family education;Manual techniques;Passive range of motion;Dry needling;Taping    PT Next Visit Plan re-assess and possible DC    PT Home Exercise Plan [email or web address]    Consulted and Agree with Plan of Care Patient           Patient will benefit from skilled therapeutic intervention in order to improve the following deficits and impairments:     Visit Diagnosis: Chronic bilateral low back pain without sciatica  Other symptoms and signs involving the musculoskeletal system  Abnormal posture     Problem List Patient Active Problem List   Diagnosis Date Noted  . Lumbar degenerative disc disease 12/27/2019  . Arm skin lesion, left 01/08/2018  . Non-healing skin lesion of nose 09/14/2017  .  Photosensitivity dermatitis 08/17/2017  . Cubital tunnel syndrome, left 12/23/2016  . Left hip pain 02/01/2016  . Anxiety and depression 07/19/2015  . Osteopenia 05/10/2015  . Vestibular migraine 01/17/2015  . Insomnia 03/09/2014  . Sinusitis, chronic 12/15/2013  . Herpes genitalis 07/22/2012  . Hyperlipidemia 07/22/2012  . Preventive measure 07/21/2012  . Obstructive  uropathy 07/21/2012  . History of smoking 07/21/2012  . Primary osteoarthritis of right shoulder 03/24/2012  . 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 08/19/2011  . GERD (gastroesophageal reflux disease) 08/19/2011    Ann Lions PT, DPT, PN1   Supplemental Physical Therapist [facility]    Pager [phone] Acute Rehab Office [facility] [place], [place], [address] Phone: [phone]   Fax:  [phone]  Name: Cody Kane MRN: [id number] Date of Birth: 1955-03-10

## 2020-02-07 ENCOUNTER — Ambulatory Visit (INDEPENDENT_AMBULATORY_CARE_PROVIDER_SITE_OTHER): Payer: PPO | Admitting: Sports Medicine

## 2020-02-07 ENCOUNTER — Ambulatory Visit: Payer: PPO | Admitting: Physician Assistant

## 2020-02-07 ENCOUNTER — Other Ambulatory Visit: Payer: Self-pay

## 2020-02-07 DIAGNOSIS — G43809 Other migraine, not intractable, without status migrainosus: Secondary | ICD-10-CM | POA: Diagnosis not present

## 2020-02-07 DIAGNOSIS — M11849 Other specified crystal arthropathies, unspecified hand: Secondary | ICD-10-CM

## 2020-02-07 DIAGNOSIS — K219 Gastro-esophageal reflux disease without esophagitis: Secondary | ICD-10-CM | POA: Diagnosis not present

## 2020-02-07 MED ORDER — BUTALBITAL-ASA-CAFF-CODEINE 50-325-40-30 MG PO CAPS: 1.0000 | ORAL_CAPSULE | ORAL | 0 refills | Status: DC | PRN

## 2020-02-07 MED ORDER — PANTOPRAZOLE SODIUM 40 MG PO TBEC: 40.0000 mg | DELAYED_RELEASE_TABLET | Freq: Every day | ORAL | 3 refills | Status: DC

## 2020-02-07 NOTE — Assessment & Plan Note (Signed)
Cody Kane has been having some migraine headaches, dull and throbbing, he does think that his Prilosec is at play, I do not entirely agree but I am happy to switch him to Protonix. We tried Trokendi and standard release topiramate, Trokendi was too expensive, he did have excessive adverse effects from standard release topiramate. Maxalt and Fiorinal are also very helpful. Brain MRI was clear in 2016. Return as needed.

## 2020-02-07 NOTE — Assessment & Plan Note (Signed)
Cody Kane historically has had well controlled GERD, Prilosec, he does feel as though it is giving him a headache so we will switch to Protonix.

## 2020-02-07 NOTE — Progress Notes (Signed)
    Procedures performed today:    None.  Independent interpretation of notes and tests performed by another provider:   None.  Brief History, Exam, Impression, and Recommendations:    GERD (gastroesophageal reflux disease) Arrie historically has had well controlled GERD, Prilosec, he does feel as though it is giving him a headache so we will switch to Protonix.  Vestibular migraine Amaris has been having some migraine headaches, dull and throbbing, he does think that his Prilosec is at play, I do not entirely agree but I am happy to switch him to Protonix. We tried Trokendi and standard release topiramate, Trokendi was too expensive, he did have excessive adverse effects from standard release topiramate. Maxalt and Fiorinal are also very helpful. Brain MRI was clear in 2016. Return as needed.    ___________________________________________ Gwen Her. Dianah Field, M.D., ABFM., CAQSM. Primary Care and [place] Instructor of [place] [employer] [facility] of [employer]

## 2020-02-10 ENCOUNTER — Encounter: Payer: Self-pay | Admitting: Physical Therapy

## 2020-02-10 ENCOUNTER — Ambulatory Visit (INDEPENDENT_AMBULATORY_CARE_PROVIDER_SITE_OTHER): Payer: PPO | Admitting: Physical Therapy

## 2020-02-10 ENCOUNTER — Other Ambulatory Visit: Payer: Self-pay

## 2020-02-10 DIAGNOSIS — R29898 Other symptoms and signs involving the musculoskeletal system: Secondary | ICD-10-CM | POA: Diagnosis not present

## 2020-02-10 DIAGNOSIS — R293 Abnormal posture: Secondary | ICD-10-CM

## 2020-02-10 DIAGNOSIS — G8929 Other chronic pain: Secondary | ICD-10-CM | POA: Diagnosis not present

## 2020-02-10 DIAGNOSIS — M545 Low back pain, unspecified: Secondary | ICD-10-CM

## 2020-02-10 NOTE — Therapy (Signed)
[place], [place], [address] Phone: [phone]   Fax:  [phone]  Physical Therapy Treatment  Patient Details  Name: Cody Kane MRN: [id number] Date of Birth: Sep 24, 1955 Referring Provider (PT): Dr Dianah Field   Encounter Date: 02/10/2020   PHYSICAL THERAPY DISCHARGE SUMMARY  Visits from Start of Care: 4  Current functional level related to goals / functional outcomes: See below- does still have some postural impairments, otherwise WNL/mildly impaired and can do what he needs and wants pain free. Wants to DC today due to being pleased with functional status.    Remaining deficits: See below    Education / Equipment: See below  Plan: Patient agrees to discharge.  Patient goals were partially met. Patient is being discharged due to being pleased with the current functional level.  ?????        PT End of Session - 02/10/20 1630    Visit Number 4    Number of Visits 4    Date for PT Re-Evaluation 02/10/20    PT Start Time 4975    PT Stop Time 1555    PT Time Calculation (min) 24 min    Activity Tolerance Patient tolerated treatment well    Behavior During Therapy [facility] for tasks assessed/performed           Past Medical History:  Diagnosis Date  . Arthritis   . BPH (benign prostatic hypertrophy)   . Elevated PSA   . GERD (gastroesophageal reflux disease)   . Lower urinary tract symptoms (LUTS)   . Mild obstructive sleep apnea    pt just had study done--  to start using cpap next week    Past Surgical History:  Procedure Laterality Date  . COLONOSCOPY  2011  . CYSTOSCOPY N/A 12/02/2013   Procedure: CYSTOSCOPY;  Surgeon: Festus Aloe, MD;  Location: [facility];  Service: Urology;  Laterality: N/A;  . GREEN LIGHT LASER TURP (TRANSURETHRAL RESECTION OF PROSTATE N/A 01/10/2014   Procedure: GREEN LIGHT LASER TURP (TRANSURETHRAL RESECTION OF PROSTATE;   Surgeon: Festus Aloe, MD;  Location: [facility];  Service: Urology;  Laterality: N/A;  . PROSTATE BIOPSY N/A 12/02/2013   Procedure: BIOPSY TRANSRECTAL ULTRASONIC PROSTATE (TUBP);  Surgeon: Festus Aloe, MD;  Location: [facility];  Service: Urology;  Laterality: N/A;    There were no vitals filed for this visit.   Subjective Assessment - 02/10/20 1534    Subjective I am stiff in the mornings, my back feels pretty good overall and does not hurt anymore. I'm doing well, there's nothing I can't do that I'd like to get back to. No issues overall.    Pertinent History restless syndrome; arthritis; GERD; vestibular migraines; Lt elbow pain; Duyuptren's contracture    Patient Stated Goals get some relief for back pain    Currently in Pain? No/denies              [facility] PT Assessment - 02/10/20 0001      Assessment   Medical Diagnosis LBP; lumbar DDD    Referring Provider (PT) Dr Dianah Field    Onset Date/Surgical Date 01/02/20    Hand Dominance Right    Next MD Visit nothing scheduled    Prior Therapy Rt hand - Dupuytren's contraction; vestibular rehab; Lt elbow ~ 15 yrs ago       Precautions   Precautions None      Restrictions   Weight Bearing Restrictions No  Balance Screen   Has the patient fallen in the past 6 months Yes    How many times? 1   dizziness   Has the patient had a decrease in activity level because of a fear of falling?  No    Is the patient reluctant to leave their home because of a fear of falling?  No      Prior Function   Level of Independence Independent      Observation/Other Assessments   Focus on Therapeutic Outcomes (FOTO)  6 % limiation      AROM   Lumbar Flexion WNL    Lumbar Extension WNL    Lumbar - Right Side Bend WNL    Lumbar - Left Side Bend WNL      Strength   Right Hip Flexion 4+/5    Right Hip Extension 4/5    Right Hip ABduction 4+/5    Left Hip Flexion 4+/5    Left Hip Extension 4/5     Left Hip ABduction 5/5    Right Knee Flexion 5/5    Right Knee Extension 5/5    Left Knee Flexion 5/5    Left Knee Extension 5/5      Flexibility   Hamstrings mild impairment    Quadriceps moderate impairment    Piriformis mild impairment      Palpation   Spinal mobility hypomobile but not painful    Palpation comment no identifiable mm tightness or spasm noted                                 PT Education - 02/10/20 1630    Education Details re-assessment findings, POC/DC today, importance of regular activity and long term compliance with HEP to avoid recurrence of pain    Person(s) Educated Patient    Methods Explanation               PT Long Term Goals - 02/10/20 1549      PT LONG TERM GOAL #1   Title Improve posture and alignment in static positions - pt to demonstrate improve sitting posture and alignment    Time 6    Period Weeks    Status Not Met      PT LONG TERM GOAL #2   Title Increase LE mobility/ROM/tissue extensibility in hip flexors and quads    Time 6    Period Weeks    Status Achieved      PT LONG TERM GOAL #3   Title Patient reports 50-75% decreased frequency, intensity and duration of LBP and pain into the buttocks    Time 6    Status Achieved      PT LONG TERM GOAL #4   Title Independent in HEP    Time 6    Period Weeks    Status Achieved      PT LONG TERM GOAL #5   Title Improve FOTO to </= 46% limitation    Time 6    Period Weeks    Status Achieved                 Plan - 02/10/20 1631    Clinical Impression Statement Pilot arrives continuing to report that he has been feeling very well- he has been compliant with HEP and really has no reported limitations at this point. Exam shows great progress towards all goals, and only goal yet unmet is his goal for  improved posture. He consistently has no pain, and reports no concerns or questions today. DC due to meeting majority of goals and being satisfied  with functional status.    Stability/Clinical Decision Making Stable/Uncomplicated    Clinical Decision Making Low    Rehab Potential Good    PT Frequency Other (comment)   DC today   PT Duration Other (comment)   DC today   PT Treatment/Interventions ADLs/Self Care Home Management;Aquatic Therapy;Cryotherapy;Electrical Stimulation;Iontophoresis 12m/ml Dexamethasone;Moist Heat;Ultrasound;Functional mobility training;Therapeutic activities;Therapeutic exercise;Balance training;Neuromuscular re-education;Patient/family education;Manual techniques;Passive range of motion;Dry needling;Taping    PT Next Visit Plan DC today    PT Home Exercise Plan [id number]    Consulted and Agree with Plan of Care Patient           Patient will benefit from skilled therapeutic intervention in order to improve the following deficits and impairments:     Visit Diagnosis: Chronic bilateral low back pain without sciatica  Other symptoms and signs involving the musculoskeletal system  Abnormal posture     Problem List Patient Active Problem List   Diagnosis Date Noted  . Lumbar degenerative disc disease 12/27/2019  . Arm skin lesion, left 01/08/2018  . Non-healing skin lesion of nose 09/14/2017  . Photosensitivity dermatitis 08/17/2017  . Cubital tunnel syndrome, left 12/23/2016  . Left hip pain 02/01/2016  . Anxiety and depression 07/19/2015  . Osteopenia 05/10/2015  . Vestibular migraine 01/17/2015  . Insomnia 03/09/2014  . Sinusitis, chronic 12/15/2013  . Herpes genitalis 07/22/2012  . Hyperlipidemia 07/22/2012  . Preventive measure 07/21/2012  . Obstructive uropathy 07/21/2012  . History of smoking 07/21/2012  . Primary osteoarthritis of right shoulder 03/24/2012  . 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 08/19/2011  . GERD (gastroesophageal reflux disease) 08/19/2011    KAnn LionsPT, DPT, PN1   Supplemental Physical Therapist [facility]    Pager [phone] Rehab Office [address] [place] [address] [address]Phone: [phone]  Fax:  [phone] Name: Cody SchertzerMRN: [id number] of Birth: 805/26/1957

## 2020-03-29 ENCOUNTER — Other Ambulatory Visit: Payer: Self-pay

## 2020-03-29 ENCOUNTER — Ambulatory Visit (INDEPENDENT_AMBULATORY_CARE_PROVIDER_SITE_OTHER): Payer: PPO | Admitting: Sports Medicine

## 2020-03-29 DIAGNOSIS — Z23 Encounter for immunization: Secondary | ICD-10-CM | POA: Diagnosis not present

## 2020-03-29 DIAGNOSIS — F419 Anxiety disorder, unspecified: Secondary | ICD-10-CM

## 2020-03-29 DIAGNOSIS — M11849 Other specified crystal arthropathies, unspecified hand: Secondary | ICD-10-CM

## 2020-03-29 DIAGNOSIS — F32A Depression, unspecified: Secondary | ICD-10-CM | POA: Diagnosis not present

## 2020-03-29 DIAGNOSIS — G43809 Other migraine, not intractable, without status migrainosus: Secondary | ICD-10-CM

## 2020-03-29 DIAGNOSIS — K219 Gastro-esophageal reflux disease without esophagitis: Secondary | ICD-10-CM

## 2020-03-29 DIAGNOSIS — E538 Deficiency of other specified B group vitamins: Secondary | ICD-10-CM

## 2020-03-29 MED ORDER — ALPRAZOLAM 0.5 MG PO TABS: 0.2500 mg | ORAL_TABLET | Freq: Two times a day (BID) | ORAL | 3 refills | Status: DC | PRN

## 2020-03-29 MED ORDER — DICLOFENAC SODIUM 75 MG PO TBEC: 75.0000 mg | DELAYED_RELEASE_TABLET | Freq: Two times a day (BID) | ORAL | 3 refills | Status: DC

## 2020-03-29 MED ORDER — ESOMEPRAZOLE MAGNESIUM 40 MG PO CPDR: 40.0000 mg | DELAYED_RELEASE_CAPSULE | Freq: Every day | ORAL | 3 refills | Status: DC

## 2020-03-29 MED ORDER — BUTALBITAL-ASPIRIN-CAFFEINE 50-325-40 MG PO CAPS: 1.0000 | ORAL_CAPSULE | Freq: Two times a day (BID) | ORAL | 3 refills | Status: DC | PRN

## 2020-03-29 NOTE — Assessment & Plan Note (Signed)
Cody Kane continues to have some migraines, dull and throbbing, we tried Trokendi, standard release topiramate, Maxalt, ultimately we ended up on Fiorinal with codeine. Removing the codeine component and just doing butalbital, aspirin, caffeine. Return as needed. Brain MRI clear in 2016.

## 2020-03-29 NOTE — Addendum Note (Signed)
Addended by: Dema Severin on: 03/29/2020 11:37 AM   Modules accepted: Orders

## 2020-03-29 NOTE — Assessment & Plan Note (Signed)
We have tried multiple medications, he did self discontinue his Cymbalta, at this point he only wants alprazolam, uses about 30 a month, will continue this. He declines any other SSRI or SNRI type options.

## 2020-03-29 NOTE — Progress Notes (Signed)
    Procedures performed today:    None.  Independent interpretation of notes and tests performed by another provider:   None.  Brief History, Exam, Impression, and Recommendations:    Anxiety and depression We have tried multiple medications, he did self discontinue his Cymbalta, at this point he only wants alprazolam, uses about 30 a month, will continue this. He declines any other SSRI or SNRI type options.  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 Currently treatment plan was leflunomide, Humira with rheumatology, daily prednisone, switching to Voltaren as an NSAID, filled out additional disability paperwork today.  GERD (gastroesophageal reflux disease) Switching from Protonix to Nexium per patient request.    ___________________________________________ Gwen Her. Dianah Field, M.D., ABFM., CAQSM. Primary Care and [place] Instructor of [place] [facility] of Medicine

## 2020-03-29 NOTE — Assessment & Plan Note (Signed)
Switching from Protonix to Nexium per patient request.

## 2020-03-29 NOTE — Assessment & Plan Note (Signed)
Currently treatment plan was leflunomide, Humira with rheumatology, daily prednisone, switching to Voltaren as an NSAID, filled out additional disability paperwork today.

## 2020-03-30 NOTE — Telephone Encounter (Signed)
See if you can add vitamin B12 to the blood drawn

## 2020-03-30 NOTE — Telephone Encounter (Signed)
Patient hasn't had his labs drawn yet. So just entering the lab order should be all that's needed.

## 2020-03-31 NOTE — Telephone Encounter (Signed)
You're right!  Orders are in.

## 2020-04-04 DIAGNOSIS — E538 Deficiency of other specified B group vitamins: Secondary | ICD-10-CM | POA: Diagnosis not present

## 2020-04-05 LAB — VITAMIN B12: Vitamin B-12: 716 pg/mL (ref 200–1100)

## 2020-07-23 ENCOUNTER — Other Ambulatory Visit: Payer: Self-pay | Admitting: Sports Medicine

## 2020-07-23 DIAGNOSIS — K219 Gastro-esophageal reflux disease without esophagitis: Secondary | ICD-10-CM

## 2020-10-04 DIAGNOSIS — R310 Gross hematuria: Secondary | ICD-10-CM | POA: Diagnosis not present

## 2020-10-04 DIAGNOSIS — R3915 Urgency of urination: Secondary | ICD-10-CM | POA: Diagnosis not present

## 2020-10-04 DIAGNOSIS — N401 Enlarged prostate with lower urinary tract symptoms: Secondary | ICD-10-CM | POA: Diagnosis not present

## 2020-10-19 ENCOUNTER — Ambulatory Visit (INDEPENDENT_AMBULATORY_CARE_PROVIDER_SITE_OTHER): Payer: PPO | Admitting: Sports Medicine

## 2020-10-19 ENCOUNTER — Encounter: Payer: Self-pay | Admitting: Sports Medicine

## 2020-10-19 ENCOUNTER — Other Ambulatory Visit: Payer: Self-pay

## 2020-10-19 VITALS — BP 107/54 | HR 75 | Ht 70.0 in | Wt 178.0 lb

## 2020-10-19 DIAGNOSIS — M5136 Other intervertebral disc degeneration, lumbar region: Secondary | ICD-10-CM | POA: Diagnosis not present

## 2020-10-19 DIAGNOSIS — E7800 Pure hypercholesterolemia, unspecified: Secondary | ICD-10-CM | POA: Diagnosis not present

## 2020-10-19 DIAGNOSIS — N139 Obstructive and reflux uropathy, unspecified: Secondary | ICD-10-CM | POA: Diagnosis not present

## 2020-10-19 DIAGNOSIS — M79671 Pain in right foot: Secondary | ICD-10-CM | POA: Diagnosis not present

## 2020-10-19 DIAGNOSIS — M51369 Other intervertebral disc degeneration, lumbar region without mention of lumbar back pain or lower extremity pain: Secondary | ICD-10-CM

## 2020-10-19 DIAGNOSIS — Z0000 Encounter for general adult medical examination without abnormal findings: Secondary | ICD-10-CM | POA: Diagnosis not present

## 2020-10-19 DIAGNOSIS — R319 Hematuria, unspecified: Secondary | ICD-10-CM | POA: Diagnosis not present

## 2020-10-19 MED ORDER — ACETAMINOPHEN-CODEINE #3 300-30 MG PO TABS: 1.0000 | ORAL_TABLET | ORAL | 0 refills | Status: DC | PRN

## 2020-10-19 NOTE — Assessment & Plan Note (Signed)
Cody Kane is here for his physical, he is due for some vaccines but prefers to get them done at the [place], he already had pneumococcal 13 vaccination, he is thus due for pneumococcal 23, he is also due for a COVID booster and Shingrix No. 2, he tells me he had #1 already.

## 2020-10-19 NOTE — Patient Instructions (Signed)
Due for pneumococcal 23 (had pneumococcal 13 already so not a candidate for Pneumovax 20), COVID booster, Shingrix No. 2

## 2020-10-19 NOTE — Assessment & Plan Note (Addendum)
Dmonte has also been dealing with some hematuria, he is seeing urologist and tells me he has had an ultrasound, MRI, cystoscopy, all unrevealing. I did inform him that this was actually reassuring, and continue follow-up with urology.

## 2020-10-19 NOTE — Assessment & Plan Note (Signed)
With some splaying of the second and third toes, likely metatarsalgia versus Morton's neuroma, I would like podiatry to weigh in.

## 2020-10-19 NOTE — Progress Notes (Addendum)
Subjective:    CC: Annual Physical Exam  HPI:  This patient is here for their annual physical  I reviewed the past medical history, family history, social history, surgical history, and allergies today and no changes were needed.  Please see the problem list section below in epic for further details.  Past Medical History: Past Medical History:  Diagnosis Date   Arthritis    BPH (benign prostatic hypertrophy)    Elevated PSA    GERD (gastroesophageal reflux disease)    Lower urinary tract symptoms (LUTS)    Mild obstructive sleep apnea    pt just had study done--  to start using cpap next week   Past Surgical History: Past Surgical History:  Procedure Laterality Date   COLONOSCOPY  2011   CYSTOSCOPY N/A 12/02/2013   Procedure: CYSTOSCOPY;  Surgeon: Festus Aloe, MD;  Location: [facility];  Service: Urology;  Laterality: N/A;   GREEN LIGHT LASER TURP (TRANSURETHRAL RESECTION OF PROSTATE N/A 01/10/2014   Procedure: GREEN LIGHT LASER TURP (TRANSURETHRAL RESECTION OF PROSTATE;  Surgeon: Festus Aloe, MD;  Location: [facility];  Service: Urology;  Laterality: N/A;   PROSTATE BIOPSY N/A 12/02/2013   Procedure: BIOPSY TRANSRECTAL ULTRASONIC PROSTATE (TUBP);  Surgeon: Festus Aloe, MD;  Location: [facility];  Service: Urology;  Laterality: N/A;   Social History: Social History   Socioeconomic History   Marital status: Married    Spouse name: Cody Kane   Number of children: 1   Years of education: College   Highest education level: Not on file  Occupational History    Comment: Administration   Tobacco Use   Smoking status: Former    Packs/day: 1.00    Years: 25.00    Pack years: 25.00    Types: Cigarettes    Quit date: 05/01/2009    Years since quitting: 11.4   Smokeless tobacco: Never   Tobacco comments:    Pt uses "vape" rare occasion   Substance and Sexual Activity   Alcohol use: Yes    Alcohol/week: 0.0 standard  drinks    Comment: occasional   Drug use: No   Sexual activity: Not on file  Other Topics Concern   Not on file  Social History Narrative   Drinks about 2 cups of coffee a day    Social Determinants of Health   Financial Resource Strain: Not on file  Food Insecurity: Not on file  Transportation Needs: Not on file  Physical Activity: Not on file  Stress: Not on file  Social Connections: Not on file   Family History: Family History  Problem Relation Age of Onset   Cancer Mother        colon   Cancer Father        lung   Allergies: No Active Allergies Medications: See med rec.  Review of Systems: No headache, visual changes, nausea, vomiting, diarrhea, constipation, dizziness, abdominal pain, skin rash, fevers, chills, night sweats, swollen lymph nodes, weight loss, chest pain, body aches, joint swelling, muscle aches, shortness of breath, mood changes, visual or auditory hallucinations.  Objective:    General: Well Developed, well nourished, and in no acute distress.  Neuro: Alert and oriented x3, extra-ocular muscles intact, sensation grossly intact. Cranial nerves II through XII are intact, motor, sensory, and coordinative functions are all intact. HEENT: Normocephalic, atraumatic, pupils equal round reactive to light, neck supple, no masses, no lymphadenopathy, thyroid nonpalpable. Oropharynx, nasopharynx, external ear canals are unremarkable. Skin: Warm and dry,  no rashes noted.  Cardiac: Regular rate and rhythm, no murmurs rubs or gallops.  Respiratory: Clear to auscultation bilaterally. Not using accessory muscles, speaking in full sentences.  Abdominal: Soft, nontender, nondistended, positive bowel sounds, no masses, no organomegaly.  Musculoskeletal: Shoulder, elbow, wrist, hip, knee, ankle stable, and with full range of motion.  Impression and Recommendations:    The patient was counselled, risk factors were discussed, anticipatory guidance given.  Annual  physical exam Cody Kane is here for his physical, he is due for some vaccines but prefers to get them done at the [place], he already had pneumococcal 13 vaccination, he is thus due for pneumococcal 23, he is also due for a COVID booster and Shingrix No. 2, he tells me he had #1 already.  Hematuria Cody Kane has also been dealing with some hematuria, he is seeing urologist and tells me he has had an ultrasound, MRI, cystoscopy, all unrevealing. I did inform him that this was actually reassuring, and continue follow-up with urology.  Right foot pain With some splaying of the second and third toes, likely metatarsalgia versus Morton's neuroma, I would like podiatry to weigh in.   Hyperlipidemia Update 10/23/2020 lipids are significantly elevated, patient admits to noncompliance with atorvastatin, he would like to restart.  Calling this in again.   ___________________________________________ Gwen Her. Dianah Field, M.D., ABFM., CAQSM. Primary Care and Sports Medicine [facility]  Adjunct Professor of [place] [employer] [facility] of Medicine

## 2020-10-22 DIAGNOSIS — N139 Obstructive and reflux uropathy, unspecified: Secondary | ICD-10-CM | POA: Diagnosis not present

## 2020-10-22 DIAGNOSIS — E7800 Pure hypercholesterolemia, unspecified: Secondary | ICD-10-CM | POA: Diagnosis not present

## 2020-10-23 LAB — COMPREHENSIVE METABOLIC PANEL
AG Ratio: 1.7 (calc) (ref 1.0–2.5)
ALT: 14 U/L (ref 9–46)
AST: 16 U/L (ref 10–35)
Albumin: 4.5 g/dL (ref 3.6–5.1)
Alkaline phosphatase (APISO): 74 U/L (ref 35–144)
BUN: 21 mg/dL (ref 7–25)
CO2: 26 mmol/L (ref 20–32)
Calcium: 9.4 mg/dL (ref 8.6–10.3)
Chloride: 109 mmol/L (ref 98–110)
Creat: 0.89 mg/dL (ref 0.70–1.35)
Globulin: 2.6 g/dL (calc) (ref 1.9–3.7)
Glucose, Bld: 94 mg/dL (ref 65–99)
Potassium: 4.6 mmol/L (ref 3.5–5.3)
Sodium: 141 mmol/L (ref 135–146)
Total Bilirubin: 0.6 mg/dL (ref 0.2–1.2)
Total Protein: 7.1 g/dL (ref 6.1–8.1)

## 2020-10-23 LAB — CBC
HCT: 44.3 % (ref 38.5–50.0)
Hemoglobin: 14.6 g/dL (ref 13.2–17.1)
MCH: 29.7 pg (ref 27.0–33.0)
MCHC: 33 g/dL (ref 32.0–36.0)
MCV: 90 fL (ref 80.0–100.0)
MPV: 9.5 fL (ref 7.5–12.5)
Platelets: 229 10*3/uL (ref 140–400)
RBC: 4.92 10*6/uL (ref 4.20–5.80)
RDW: 13.1 % (ref 11.0–15.0)
WBC: 6.6 10*3/uL (ref 3.8–10.8)

## 2020-10-23 LAB — LIPID PANEL
Cholesterol: 249 mg/dL — ABNORMAL HIGH (ref <200)
HDL: 43 mg/dL (ref >=40)
LDL Cholesterol (Calc): 175 mg/dL (calc) — ABNORMAL HIGH
Non-HDL Cholesterol (Calc): 206 mg/dL (calc) — ABNORMAL HIGH (ref <130)
Total CHOL/HDL Ratio: 5.8 (calc) — ABNORMAL HIGH (ref <5.0)
Triglycerides: 162 mg/dL — ABNORMAL HIGH (ref <150)

## 2020-10-23 LAB — PSA, TOTAL AND FREE
PSA, % Free: 18 % (calc) — ABNORMAL LOW (ref >25)
PSA, Free: 1 ng/mL
PSA, Total: 5.5 ng/mL — ABNORMAL HIGH (ref <=4.0)

## 2020-10-23 LAB — TSH: TSH: 0.41 mIU/L (ref 0.40–4.50)

## 2020-10-23 MED ORDER — ATORVASTATIN CALCIUM 20 MG PO TABS: 20.0000 mg | ORAL_TABLET | Freq: Every day | ORAL | 3 refills | Status: DC

## 2020-10-23 NOTE — Addendum Note (Signed)
Addended by: Silverio Decamp on: 10/23/2020 09:43 AM   Modules accepted: Orders

## 2020-10-23 NOTE — Assessment & Plan Note (Signed)
Update 10/23/2020 lipids are significantly elevated, patient admits to noncompliance with atorvastatin, he would like to restart.  Calling this in again.

## 2020-11-08 ENCOUNTER — Telehealth: Payer: Self-pay | Admitting: Lab

## 2020-11-08 NOTE — Chronic Care Management (AMB) (Signed)
  Chronic Care Management   Note  11/08/2020 Name: Cody Kane MRN: [id number] DOB: [birth year]  Cody Kane is a 65 y.o. year old male who is a primary care patient of Dianah Field, Gwen Her, MD. I reached out to Shary Key by phone today in response to a referral sent by [name]'s PCP, Silverio Decamp, MD.   [name] was given information about Chronic Care Management services today including:  CCM service includes personalized support from designated clinical staff supervised by his physician, including individualized plan of care and coordination with other care providers 24/7 contact phone numbers for assistance for urgent and routine care needs. Service will only be billed when office clinical staff spend 20 minutes or more in a month to coordinate care. Only one practitioner may furnish and bill the service in a calendar month. The patient may stop CCM services at any time (effective at the end of the month) by phone call to the office staff.   Patient agreed to services and verbal consent obtained.   Follow up [place]

## 2020-11-16 ENCOUNTER — Other Ambulatory Visit: Payer: Self-pay | Admitting: Sports Medicine

## 2020-11-16 DIAGNOSIS — K219 Gastro-esophageal reflux disease without esophagitis: Secondary | ICD-10-CM

## 2020-11-16 DIAGNOSIS — N401 Enlarged prostate with lower urinary tract symptoms: Secondary | ICD-10-CM | POA: Diagnosis not present

## 2020-11-16 DIAGNOSIS — R972 Elevated prostate specific antigen [PSA]: Secondary | ICD-10-CM | POA: Diagnosis not present

## 2020-11-16 DIAGNOSIS — R3912 Poor urinary stream: Secondary | ICD-10-CM | POA: Diagnosis not present

## 2020-11-27 ENCOUNTER — Encounter (INDEPENDENT_AMBULATORY_CARE_PROVIDER_SITE_OTHER): Payer: PPO

## 2020-11-27 DIAGNOSIS — N139 Obstructive and reflux uropathy, unspecified: Secondary | ICD-10-CM | POA: Diagnosis not present

## 2020-11-27 MED ORDER — TADALAFIL 5 MG PO TABS: 5.0000 mg | ORAL_TABLET | Freq: Every day | ORAL | 11 refills | Status: DC

## 2020-11-27 NOTE — Telephone Encounter (Signed)
I spent 5 total minutes of online digital evaluation and management services. 

## 2020-12-07 ENCOUNTER — Encounter: Payer: Self-pay | Admitting: Podiatry

## 2020-12-07 ENCOUNTER — Ambulatory Visit: Payer: PPO | Admitting: Podiatry

## 2020-12-07 ENCOUNTER — Other Ambulatory Visit: Payer: Self-pay

## 2020-12-07 DIAGNOSIS — C4360 Malignant melanoma of unspecified upper limb, including shoulder: Secondary | ICD-10-CM | POA: Insufficient documentation

## 2020-12-07 DIAGNOSIS — R972 Elevated prostate specific antigen [PSA]: Secondary | ICD-10-CM | POA: Insufficient documentation

## 2020-12-07 DIAGNOSIS — M720 Palmar fascial fibromatosis [Dupuytren]: Secondary | ICD-10-CM | POA: Insufficient documentation

## 2020-12-07 DIAGNOSIS — G5761 Lesion of plantar nerve, right lower limb: Secondary | ICD-10-CM | POA: Diagnosis not present

## 2020-12-07 DIAGNOSIS — H9313 Tinnitus, bilateral: Secondary | ICD-10-CM | POA: Insufficient documentation

## 2020-12-07 DIAGNOSIS — M545 Low back pain, unspecified: Secondary | ICD-10-CM | POA: Insufficient documentation

## 2020-12-07 DIAGNOSIS — R310 Gross hematuria: Secondary | ICD-10-CM | POA: Insufficient documentation

## 2020-12-07 DIAGNOSIS — N400 Enlarged prostate without lower urinary tract symptoms: Secondary | ICD-10-CM | POA: Insufficient documentation

## 2020-12-07 DIAGNOSIS — M069 Rheumatoid arthritis, unspecified: Secondary | ICD-10-CM | POA: Insufficient documentation

## 2020-12-07 DIAGNOSIS — L573 Poikiloderma of Civatte: Secondary | ICD-10-CM | POA: Insufficient documentation

## 2020-12-07 DIAGNOSIS — N410 Acute prostatitis: Secondary | ICD-10-CM | POA: Insufficient documentation

## 2020-12-07 DIAGNOSIS — R42 Dizziness and giddiness: Secondary | ICD-10-CM | POA: Insufficient documentation

## 2020-12-07 DIAGNOSIS — E7800 Pure hypercholesterolemia, unspecified: Secondary | ICD-10-CM | POA: Insufficient documentation

## 2020-12-07 DIAGNOSIS — Z961 Presence of intraocular lens: Secondary | ICD-10-CM | POA: Insufficient documentation

## 2020-12-07 DIAGNOSIS — Z461 Encounter for fitting and adjustment of hearing aid: Secondary | ICD-10-CM | POA: Insufficient documentation

## 2020-12-07 DIAGNOSIS — H903 Sensorineural hearing loss, bilateral: Secondary | ICD-10-CM | POA: Insufficient documentation

## 2020-12-07 DIAGNOSIS — D485 Neoplasm of uncertain behavior of skin: Secondary | ICD-10-CM | POA: Insufficient documentation

## 2020-12-07 DIAGNOSIS — F17290 Nicotine dependence, other tobacco product, uncomplicated: Secondary | ICD-10-CM | POA: Insufficient documentation

## 2020-12-07 DIAGNOSIS — G8929 Other chronic pain: Secondary | ICD-10-CM | POA: Insufficient documentation

## 2020-12-07 DIAGNOSIS — D72824 Basophilia: Secondary | ICD-10-CM | POA: Insufficient documentation

## 2020-12-07 DIAGNOSIS — Z85820 Personal history of malignant melanoma of skin: Secondary | ICD-10-CM | POA: Insufficient documentation

## 2020-12-07 NOTE — Progress Notes (Signed)
  Subjective:  Patient ID: Cody Kane, male    DOB: [birth year],   MRN: [id number]  Chief Complaint  Patient presents with   Foot Pain    Both of my feet hurt but the right is the worst and hurts in the ball and started early spring and late summer and feels like I am stepping on something    65 y.o. male presents for right foot pain that has been present for several months. Relate it has improved somewhat over the months without any treatment. Relates the ball of his foot around his second and third does is where he is tender. Relates a history of autoimmune disorder for which he takes prednisone.  . Denies any other pedal complaints. Denies n/v/f/c.   Past Medical History:  Diagnosis Date   Arthritis    BPH (benign prostatic hypertrophy)    Elevated PSA    GERD (gastroesophageal reflux disease)    Lower urinary tract symptoms (LUTS)    Mild obstructive sleep apnea    pt just had study done--  to start using cpap next week    Objective:  Physical Exam: Vascular: DP/PT pulses 2/4 bilateral. CFT <3 seconds. Normal hair growth on digits. No edema.  Skin. No lacerations or abrasions bilateral feet.  Musculoskeletal: MMT 5/5 bilateral lower extremities in DF, PF, Inversion and Eversion. Deceased ROM in DF of ankle joint. Tender in second interspace of right foot. Tender to medial and lateral squeeze of the metatarsals. Negative Mulder's click.  Neurological: Sensation intact to light touch.   Assessment:   1. Neuroma of second interspace of right foot      Plan:  Patient was evaluated and treated and all questions answered. Discussed neuroma and treatment options with patient.  Injection offered today. Patient deferred.  Discussed padding and offloading today.  Continue with anti-inflammatories at home.  Discussed if pain does not improve may consider  MRI for further surgical planning.  Patient to return in 6 weeks or sooner if concerns arise.     Lorenda Peck, DPM

## 2020-12-14 ENCOUNTER — Telehealth: Payer: Self-pay | Admitting: Pharmacist

## 2020-12-14 NOTE — Chronic Care Management (AMB) (Signed)
Chronic Care Management Pharmacy Assistant   Name: Cody Kane  MRN: [id number] DOB: 1956-01-10  Cody Kane is an 65 y.o. year old male who presents for his initial CCM visit with the clinical pharmacist.  Recent office visits:  10/19/20-Tomas J. Dianah Field, MD (PCP) Annual exam visit. Labs ordered. Follow up in 1 year.  Recent consult visits:  12/07/20-Rebecca Blenda Mounts, MD (Podiatry) Seen for foot pain. Injection offered today. Patient deferred. Follow up in 6 weeks. 11/30/20-Ronald Foye Deer ([facility])  11/27/20-Amy Mateo Flow [facility])  10/22/20-Marian Guadalupe Dawn [facility])  08/11/22Konstantinos Votanopoulos [facility])  10/04/20-Larry Ludger Nutting (Nurse Practitioner) Notes not available. 08/13/20-Joseph LSharol Roussel [facility])   Hospital visits:  None in previous 6 months  Medications: Outpatient Encounter Medications as of 12/14/2020  Medication Sig   acetaminophen-codeine (TYLENOL #3) 300-30 MG tablet Take 1 tablet by mouth every 4 (four) hours as needed for moderate pain.   Adalimumab 40 MG/0.4ML PNKT Inject into the skin.   alfuzosin (UROXATRAL) 10 MG 24 hr tablet Take 10 mg by mouth at bedtime.   ALPRAZolam (XANAX) 0.5 MG tablet Take 0.5 tablets (0.25 mg total) by mouth 2 (two) times daily as needed for anxiety.   atorvastatin (LIPITOR) 20 MG tablet Take 1 tablet (20 mg total) by mouth daily.   butalbital-acetaminophen-caffeine (FIORICET) 50-325-40 MG tablet TAKE ONE TABLET BY MOUTH EVERY FOUR HOURS AS NEEDED   butalbital-aspirin-caffeine (FIORINAL) 50-325-40 MG capsule Take 1 capsule by mouth 2 (two) times daily as needed for headache.   ciprofloxacin (CIPRO) 500 MG tablet TAKE ONE TABLET BY MOUTH TWICE A DAY (MONITOR and REPORT ANY SUDDEN ONSET TENDON PAIN) TAKE THE DAY BEFORE, THE DAY OF AND THE DAY AFTER PROSTATE BIOPSY   diclofenac (VOLTAREN) 75 MG EC tablet Take 1 tablet (75 mg total) by mouth 2 (two) times daily.    DULoxetine (CYMBALTA) 60 MG capsule Take by mouth.   dutasteride (AVODART) 0.5 MG capsule Take 0.5 mg by mouth daily.   esomeprazole (NEXIUM) 40 MG capsule TAKE 1 CAPSULE (40 MG TOTAL) BY MOUTH DAILY.   lidocaine (LIDODERM) 5 % APPLY 1 PATCH TO SKIN ONCE DAILY (APPLY FOR 12 HOURS, THEN REMOVE FOR 12 HOURS) APPLY TO PAINFUL AREAS  JOINTS  NECK/BACK   nicotine (NICODERM CQ - DOSED IN MG/24 HOURS) 14 mg/24hr patch APPLY 1 PATCH TO SKIN ONCE A DAY *APPLY TO NON-HAIRY, CLEAN, DRY AREA (NO TOBACCO PRODUCTS)* FOR 2 WEEKS - THIS IS STEP 2 - BEGIN AFTER 21MG  PATCHES COMPELTE   predniSONE (DELTASONE) 5 MG tablet Take 1 tablet (5 mg total) by mouth daily with breakfast.   rizatriptan (MAXALT-MLT) 5 MG disintegrating tablet Take 1 tablet (5 mg total) by mouth as needed for migraine. May repeat in 2 hours if needed   tadalafil (CIALIS) 5 MG tablet Take 1 tablet (5 mg total) by mouth daily.   valACYclovir (VALTREX) 1000 MG tablet Take 1 tablet (1,000 mg total) by mouth 2 (two) times daily as needed.   No facility-administered encounter medications on file as of 12/14/2020.   Acetaminophen-codeine (TYLENOL #3) 300-30 MG tablet Last filled:10/19/20 5 DS Adalimumab 40 MG/0.4ML PNKT Last filled:02/01/2018 28 DS Alfuzosin (UROXATRAL) 10 MG 24 hr tablet Last filled:10/04/20 30 DS ALPRAZolam (XANAX) 0.5 MG tablet Last filled:03/29/20 30 DS Atorvastatin (LIPITOR) 20 MG tablet Last filled:10/23/20 90 DS Butalbital-acetaminophen-caffeine (FIORICET) 50-325-40 MG tablet Last filled:02/07/20 5 DS Butalbital-aspirin-caffeine (FIORINAL) 50-325-40 MG capsule Last filled:06/27/20 15 DS Ciprofloxacin (CIPRO) 500 MG tablet Last filled:None noted Diclofenac (  VOLTAREN) 75 MG EC tablet Last filled:03/29/20 30 DS DULoxetine (CYMBALTA) 60 MG capsule Last filled:None noted Dutasteride (AVODART) 0.5 MG capsule Last filled:10/04/20 30 DS Esomeprazole (NEXIUM) 40 MG capsule Last filled:11/16/20 90 DS Lidocaine (LIDODERM) 5 % Last  filled:None noted Nicotine (NICODERM CQ - DOSED IN MG/24 HOURS) 14 mg/24hr patch Last filled:None noted PredniSONE (DELTASONE) 5 MG tablet Last filled:12/27/19 5 DS Rizatriptan (MAXALT-MLT) 5 MG disintegrating tablet Last filled:06/27/20 10 DS Tadalafil (CIALIS) 5 MG tablet Last filled:None noted ValACYclovir (VALTREX) 1000 MG tablet Last filled:None noted   Care Gaps: COVID-19 Vaccine:Overdue since 03/01/2020  Star Rating Drugs: Atorvastatin (LIPITOR) 20 MG tablet Last filled:10/23/20 90 DS Rizatriptan (MAXALT-MLT) 5 MG disintegrating tablet Last filled:06/27/20 10 DS  [name], [place]

## 2020-12-26 ENCOUNTER — Other Ambulatory Visit: Payer: Self-pay

## 2020-12-26 ENCOUNTER — Ambulatory Visit (INDEPENDENT_AMBULATORY_CARE_PROVIDER_SITE_OTHER): Payer: PPO | Admitting: Pharmacist

## 2020-12-26 DIAGNOSIS — G8929 Other chronic pain: Secondary | ICD-10-CM

## 2020-12-26 DIAGNOSIS — I10 Essential (primary) hypertension: Secondary | ICD-10-CM

## 2020-12-26 DIAGNOSIS — E7800 Pure hypercholesterolemia, unspecified: Secondary | ICD-10-CM
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
## 2020-12-28 MED ORDER — ROSUVASTATIN CALCIUM 5 MG PO TABS: 5.0000 mg | ORAL_TABLET | Freq: Every day | ORAL | 3 refills | Status: DC

## 2020-12-28 NOTE — Patient Instructions (Signed)
Visit Information   PATIENT GOALS:   Goals Addressed             This Visit's Progress    Medication management       Patient Goals/Self-Care Activities Over the next 60 days, patient will:  take medications as prescribed  Follow Up Plan: Telephone follow up appointment with care management team member scheduled for:  2 months         Consent to CCM Services: Mr. Card was given information about Chronic Care Management services including:  CCM service includes personalized support from designated clinical staff supervised by his physician, including individualized plan of care and coordination with other care providers 24/7 contact phone numbers for assistance for urgent and routine care needs. Service will only be billed when office clinical staff spend 20 minutes or more in a month to coordinate care. Only one practitioner may furnish and bill the service in a calendar month. The patient may stop CCM services at any time (effective at the end of the month) by phone call to the office staff. The patient will be responsible for cost sharing (co-pay) of up to 20% of the service fee (after annual deductible is met).  Patient agreed to services and verbal consent obtained.   Patient verbalizes understanding of instructions provided today and agrees to view in [place].   Telephone follow up appointment with care management team member scheduled for: 2 months  CLINICAL CARE PLAN: Patient Care Plan: Medication Management     Problem Identified: HLD, chronic pain      Long-Range Goal: Disease Progression Prevention   Start Date: 12/26/2020  This Visit's Progress: On track  Priority: High  Note:   Current Barriers:  Unable to achieve control of cholesterol due to statin side effects of muscle pains and urinary symptoms   Pharmacist Clinical Goal(s):  Over the next 60 days, patient will adhere to plan to optimize therapeutic regimen for hyperlipidemia as evidenced by  report of adherence to recommended medication management changes through collaboration with PharmD and provider.   Interventions: 1:1 collaboration with Silverio Decamp, MD regarding development and update of comprehensive plan of care as evidenced by provider attestation and co-signature Inter-disciplinary care team collaboration (see longitudinal plan of care) Comprehensive medication review performed; medication list updated in electronic medical record  Hyperlipidemia:  Uncontrolled; current treatment:atorvastatin 46m daily prescribed, but not adherent;   Educated on medication differences: The predominantly lipophilic statins (simvastatin, fluvastatin, pitavastatin, lovastatin and atorvastatin) can easily enter cells, whereas hydrophilic statins (rosuvastatin and pravastatin) present greater hepatoselectivity. Hydrophilic statins have less muscle penetration and therefore lower risk of statin associated muscle symptoms.( [place]2021; 8: 6176160) Recommended consider stopping atorvastatin and trying rosuvastatin 528mdaily.  Chronic Pain  Controlled; current treatment:fioricet PRN, Fiorinal PRN, Tylenol #3 PRN, and patient states has leftover percocet from an old injury;   Counseled on medication safety and bowel regimen Recommended continue current regimen  Tobacco Use: to be discussed in future  Patient Goals/Self-Care Activities Over the next 60 days, patient will:  take medications as prescribed  Follow Up Plan: Telephone follow up appointment with care management team member scheduled for:  2 months

## 2020-12-28 NOTE — Addendum Note (Signed)
Addended by: Silverio Decamp on: 12/28/2020 04:44 PM   Modules accepted: Orders

## 2020-12-28 NOTE — Assessment & Plan Note (Signed)
Switching to Crestor 5

## 2020-12-31 DIAGNOSIS — E7800 Pure hypercholesterolemia, unspecified: Secondary | ICD-10-CM

## 2020-12-31 DIAGNOSIS — I10 Essential (primary) hypertension: Secondary | ICD-10-CM

## 2021-01-11 ENCOUNTER — Other Ambulatory Visit: Payer: Self-pay

## 2021-01-11 ENCOUNTER — Ambulatory Visit (INDEPENDENT_AMBULATORY_CARE_PROVIDER_SITE_OTHER): Payer: PPO | Admitting: Sports Medicine

## 2021-01-11 DIAGNOSIS — E7800 Pure hypercholesterolemia, unspecified: Secondary | ICD-10-CM | POA: Diagnosis not present

## 2021-01-11 DIAGNOSIS — M11849 Other specified crystal arthropathies, unspecified hand: Secondary | ICD-10-CM

## 2021-01-11 MED ORDER — HYDROCODONE-IBUPROFEN 5-200 MG PO TABS: 1.0000 | ORAL_TABLET | Freq: Three times a day (TID) | ORAL | 0 refills | Status: DC | PRN

## 2021-01-11 NOTE — Progress Notes (Signed)
    Procedures performed today:    None.  Independent interpretation of notes and tests performed by another provider:   None.  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 Medications with Tylenol tend to not agree with him, tramadol has been ineffective, Tylenol 3 ineffective, we can try Vicoprofen, he understands he will get maybe 10 of these at a time and if we need to use more we would need to refer to pain management. I also filled out additional disability paperwork, this will be scanned into the medical record.  Hyperlipidemia At this point has had intolerances to multiple statins, he will look into fenofibrate and let me know if he would like to try it.    ___________________________________________ Gwen Her. Dianah Field, M.D., ABFM., CAQSM. Primary Care and [place] Instructor of [place] [employer] [facility]

## 2021-01-11 NOTE — Assessment & Plan Note (Signed)
Cody Kane has chronic widespread pains, he does have calcium pyrophosphate arthropathy with seronegative rheumatoid arthritis. Medications with Tylenol tend to not agree with him, tramadol has been ineffective, Tylenol 3 ineffective, we can try Vicoprofen, he understands he will get maybe 10 of these at a time and if we need to use more we would need to refer to pain management. I also filled out additional disability paperwork, this will be scanned into the medical record.

## 2021-01-11 NOTE — Assessment & Plan Note (Signed)
At this point has had intolerances to multiple statins, he will look into fenofibrate and let me know if he would like to try it.

## 2021-01-18 ENCOUNTER — Ambulatory Visit: Payer: PPO | Admitting: Podiatry

## 2021-02-07 ENCOUNTER — Other Ambulatory Visit: Payer: Self-pay

## 2021-02-07 ENCOUNTER — Ambulatory Visit: Payer: PPO | Admitting: Pharmacist

## 2021-02-07 DIAGNOSIS — E7800 Pure hypercholesterolemia, unspecified: Secondary | ICD-10-CM

## 2021-02-07 DIAGNOSIS — G8929 Other chronic pain: Secondary | ICD-10-CM

## 2021-02-07 DIAGNOSIS — Z87891 Personal history of nicotine dependence: Secondary | ICD-10-CM

## 2021-02-07 DIAGNOSIS — M545 Low back pain, unspecified: Secondary | ICD-10-CM

## 2021-02-07 NOTE — Progress Notes (Signed)
Chronic Care Management Pharmacy Note  02/07/2021 Name:  Cody Kane MRN:  [id number] DOB:  1955-04-24  Summary: addressed HLD, chronic pain. Tried rosuvastatin, patient described muscle pains and intolerance. Discussed zetia and other cholesterol options, declines all medication for cholesterol at this time & prefers to monitor with lifestyle management.  Counseled on various options for tobacco cessation.   Recommendations/Changes made from today's visit:  - Recommend utilizing two methods (patch PLUS either lozenge or gum OTC as current usage correlates with data suggesting dual approach more effective).  - No other medication changes or recs at this time.   Plan: f/u with pharmacist in 6-8  Subjective: Cody Kane is an 65 y.o. year old male who is a primary patient of Thekkekandam, Gwen Her, MD.  The CCM team was consulted for assistance with disease management and care coordination needs.    Engaged with patient by telephone for follow up visit in response to provider referral for pharmacy case management and/or care coordination services.   Consent to Services:  The patient was given information about Chronic Care Management services, agreed to services, and gave verbal consent prior to initiation of services.  Please see initial visit note for detailed documentation.   Patient Care Team: Silverio Decamp, MD as PCP - General (Family Medicine) Darius Bump, [facility] as Pharmacist (Pharmacist)  Recent office visits:  10/19/20-Tomas J. Dianah Field, MD (PCP) Annual exam visit. Labs ordered. Follow up in 1 year.   Recent consult visits:  12/07/20-Rebecca Blenda Mounts, MD (Podiatry) Seen for foot pain. Injection offered today. Patient deferred. Follow up in 6 weeks. 11/30/20-Ronald Foye Deer ([facility])  11/27/20-Amy Mateo [facility])  10/22/20-Marian Guadalupe Dawn [facility])  08/11/22Konstantinos Votanopoulos [facility])   10/04/20-Larry Ludger Nutting (Nurse Practitioner) Notes not available. 08/13/20-Joseph Linden Dolin [facility])    Hospital visits:  None in previous 6 months  Objective:  Lab Results  Component Value Date   CREATININE 0.89 10/22/2020   CREATININE 0.96 10/19/2019   CREATININE 1.11 06/13/2016    Lab Results  Component Value Date   HGBA1C 5.3 06/13/2016      Component Value Date/Time   CHOL 249 (H) 10/22/2020 0000   TRIG 162 (H) 10/22/2020 0000   HDL 43 10/22/2020 0000   CHOLHDL 5.8 (H) 10/22/2020 0000   VLDL 24 07/02/2015 0839   LDLCALC 175 (H) 10/22/2020 0000    Hepatic Function Latest Ref Rng & Units 10/22/2020 10/19/2019 06/13/2016  Total Protein 6.1 - 8.1 g/dL 7.1 6.6 6.7  Albumin 3.6 - 5.1 g/dL - - 4.1  AST 10 - 35 U/L '16 17 23  ' ALT 9 - 46 U/L 14 19 33  Alk Phosphatase 40 - 115 U/L - - 64  Total Bilirubin 0.2 - 1.2 mg/dL 0.6 0.6 0.5    Lab Results  Component Value Date/Time   TSH 0.41 10/22/2020 12:00 AM   TSH 0.62 10/19/2019 07:47 AM    CBC Latest Ref Rng & Units 10/22/2020 10/19/2019 06/13/2016  WBC 3.8 - 10.8 Thousand/uL 6.6 7.8 5.8  Hemoglobin 13.2 - 17.1 g/dL 14.6 13.8 14.7  Hematocrit 38.5 - 50.0 % 44.3 41.9 43.2  Platelets 140 - 400 Thousand/uL 229 206 212    Lab Results  Component Value Date/Time   VD25OH 28 (L) 07/02/2015 08:39 AM   VD25OH 35 10/28/2013 11:32 AM    Clinical ASCVD: Yes  The 10-year ASCVD risk score (Arnett DK, et al., 2019) is: 21%*   Values used to calculate the score:  Age: 89 years     Sex: Male     Is Non-Hispanic African American: No     Diabetic: No     Tobacco smoker: Yes     Systolic Blood Pressure: 250 mmHg     Is BP treated: No     HDL Cholesterol: 43 mg/dL     Total Cholesterol: 244 mg/dL*     * - Cholesterol units were assumed for this score calculation    Other: (CHADS2VASc if Afib, PHQ9 if depression, MMRC or CAT for COPD, ACT, DEXA)  Social History   Tobacco Use  Smoking Status Former    Packs/day: 1.00   Years: 25.00   Pack years: 25.00   Types: Cigarettes   Quit date: 05/01/2009   Years since quitting: 11.7  Smokeless Tobacco Never  Tobacco Comments   Pt uses "vape" rare occasion    BP Readings from Last 3 Encounters:  01/11/21 123/71  10/19/20 (!) 107/54  11/15/19 120/65   Pulse Readings from Last 3 Encounters:  01/11/21 72  10/19/20 75  11/15/19 74   Wt Readings from Last 3 Encounters:  01/11/21 177 lb (80.3 kg)  10/19/20 178 lb (80.7 kg)  11/15/19 195 lb (88.5 kg)    Assessment: Review of patient past medical history, allergies, medications, health status, including review of consultants reports, laboratory and other test data, was performed as part of comprehensive evaluation and provision of chronic care management services.   SDOH:  (Social Determinants of Health) assessments and interventions performed:    CCM Care Plan  Allergies  Allergen Reactions   Flomax [Tamsulosin]    Statins Other (See Comments)    Myalgias and polyuria, with atorvastatin and Crestor   Finasteride Rash    Medications Reviewed Today     Reviewed by Darius Bump, [facility] (Pharmacist) on 02/07/21 at 1411  Med List Status: <None>   Medication Order Taking? Sig Documenting Provider Last Dose Status Informant  alfuzosin (UROXATRAL) 10 MG 24 hr tablet [id number] Yes Take 10 mg by mouth at bedtime. [provider] Taking Active   ALPRAZolam Duanne Moron) 0.5 MG tablet [id number] Yes Take 0.5 tablets (0.25 mg total) by mouth 2 (two) times daily as needed for anxiety. Silverio Decamp, MD Taking Active   Co-Enzyme Q10 100 MG CAPS [id number] Yes Take 1 capsule by mouth daily. [provider] Taking Active   dutasteride (AVODART) 0.5 MG capsule [id number] Yes Take 0.5 mg by mouth daily. [provider] Taking Active   esomeprazole (NEXIUM) 40 MG capsule [id number] Yes TAKE 1 CAPSULE (40 MG TOTAL) BY MOUTH DAILY. Silverio Decamp, MD Taking Active    hydrocodone-ibuprofen (VICOPROFEN) 5-200 MG tablet [id number] Yes Take 1 tablet by mouth every 8 (eight) hours as needed for pain. Silverio Decamp, MD Taking Active   lidocaine (LIDODERM) 5 % [id number] Yes APPLY 1 PATCH TO SKIN ONCE DAILY (APPLY FOR 12 HOURS, THEN REMOVE FOR 12 HOURS) APPLY TO PAINFUL AREAS  JOINTS  NECK/BACK [provider] Taking Active   nicotine (NICODERM CQ - DOSED IN MG/24 HOURS) 14 mg/24hr patch [id number] No APPLY 1 PATCH TO SKIN ONCE A DAY *APPLY TO NON-HAIRY, CLEAN, DRY AREA (NO TOBACCO PRODUCTS)* FOR 2 WEEKS - THIS IS STEP 2 - BEGIN AFTER 21MG PATCHES COMPELTE  Patient not taking: Reported on 02/07/2021   [provider] Not Taking Active   predniSONE (DELTASONE) 5 MG tablet [id number] Yes Take 1 tablet (5 mg total) by mouth daily with breakfast. Dianah Field,  Gwen Her, MD Taking Active   rizatriptan (MAXALT-MLT) 5 MG disintegrating tablet [id number] Yes Take 1 tablet (5 mg total) by mouth as needed for migraine. May repeat in 2 hours if needed Silverio Decamp, MD Taking Active   tadalafil (CIALIS) 5 MG tablet [id number] Yes Take 1 tablet (5 mg total) by mouth daily. Silverio Decamp, MD Taking Active   valACYclovir (VALTREX) 1000 MG tablet [id number] Yes Take 1 tablet (1,000 mg total) by mouth 2 (two) times daily as needed. Silverio Decamp, MD Taking Active             Patient Active Problem List   Diagnosis Date Noted   Acute prostatitis 12/07/2020   Basophilia 12/07/2020   Benign prostatic hyperplasia 12/07/2020   Bilateral pseudophakia 12/07/2020   Chronic low back pain 12/07/2020   Cigar smoker 12/07/2020   Dizziness and giddiness 12/07/2020   Elevated prostate specific antigen (PSA) 12/07/2020   Encounter for fitting and adjustment of hearing aid 12/07/2020   Gross hematuria 12/07/2020   Malignant melanoma of skin of forearm ([place]) 12/07/2020   Malignant melanoma of unspecified upper limb, including shoulder ([place])  12/07/2020   Neoplasm of uncertain behavior of skin 12/07/2020   Palmar fascial fibromatosis (dupuytren) 12/07/2020   Personal history of malignant melanoma of skin 12/07/2020   Poikiloderma of [place] 12/07/2020   Pure hypercholesterolemia, unspecified 12/07/2020   Rheumatoid arthritis, unspecified ([place]) 12/07/2020   Sensorineural hearing loss, bilateral 12/07/2020   Tinnitus, bilateral 12/07/2020   Hematuria 10/19/2020   Right foot pain 10/19/2020   Lumbar degenerative disc disease 12/27/2019   Non-healing skin lesion of nose 09/14/2017   Photosensitivity dermatitis 08/17/2017   Long-term use of high-risk medication 03/30/2017   Rheumatoid arthritis involving both hands with negative rheumatoid factor ([place]) 02/26/2017   Cubital tunnel syndrome, left 12/23/2016   Left hip pain 02/01/2016   Anxiety and depression 07/19/2015   Osteopenia 05/10/2015   Vestibular migraine 01/17/2015   Acute pain due to injury 05/22/2014   Blisters with epidermal loss due to burn (second degree) of lower limb (leg) 05/22/2014   Burn any degree involving less than 10 percent of body surface 05/22/2014   Itch 05/22/2014   Insomnia 03/09/2014   Sinusitis, chronic 12/15/2013   Herpes genitalis 07/22/2012   Hyperlipidemia 07/22/2012   Annual physical exam 07/21/2012   Obstructive uropathy 07/21/2012   History of smoking 07/21/2012   Primary osteoarthritis of right shoulder 03/24/2012   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 08/19/2011   GERD (gastroesophageal reflux disease) 08/19/2011    Immunization History  Administered Date(s) Administered   Influenza,inj,Quad PF,6+ Mos 11/29/2014, 12/03/2015, 12/23/2016, 10/16/2017, 03/29/2020   Moderna Sars-Covid-2 Vaccination 04/20/2019, 05/19/2019   Pneumococcal Conjugate-13 10/18/2019   Tdap 05/19/2014   Zoster, Live 06/29/2015    Conditions to be addressed/monitored: HLD and chronic pain, tobacco use  Care  Plan : Medication Management  Updates made by Darius Bump, [place] since 02/07/2021 12:00 AM     Problem: HLD, chronic pain      Long-Range Goal: Disease Progression Prevention   Start Date: 12/26/2020  Recent Progress: On track  Priority: High  Note:   Current Barriers:  Unable to achieve control of cholesterol due to statin side effects of muscle pains and urinary symptoms   Pharmacist Clinical Goal(s):  Over the next 180 days, patient will adhere to plan to optimize therapeutic regimen for hyperlipidemia as evidenced by report of adherence to recommended medication management changes through  collaboration with PharmD and provider.   Interventions: 1:1 collaboration with Silverio Decamp, MD regarding development and update of comprehensive plan of care as evidenced by provider attestation and co-signature Inter-disciplinary care team collaboration (see longitudinal plan of care) Comprehensive medication review performed; medication list updated in electronic medical record  Hyperlipidemia:  Uncontrolled; current treatment: no current medications;   Previous medications attempted: atorvastatin & rosuvastatin, both with significant muscle pains Recommended lifestyle management, patient declines all cholesterol interventions at this time  Chronic Pain  Controlled; current treatment:fioricet PRN, Fiorinal PRN, Tylenol #3 PRN, and patient states has leftover percocet from an old injury;   Counseled on medication safety and bowel regimen Recommended continue current regimen  Tobacco Use:  1-/2 - 3/4 packs per day; 48 years of use; 8 months since last quit attempt, Does smoke within 30 minutes of waking up  Previous quit attempts:  "probably a dozen times" unsuccessful using nicotine patch   Triggers to smoke: stress, situational  Motivation to quit smoking: expensive habit  On a scale of 1-10, how IMPORTANT is it for you to quit smoking: 7-8  On a scale of 1-10, how  CONFIDENT are you that you can quit smoking: 9-10  Counseled on various options & importance of utilizing 2 methods (patch PLUS gum or lozenge)  Recommended patient utilize nicotine patch + OTC options and reach back out to myself or PCP if further assistance is needed   Patient Goals/Self-Care Activities Over the next 180 days, patient will:  take medications as prescribed  Follow Up Plan: Telephone follow up appointment with care management team member scheduled for:  6-8 months       Medication Assistance: None required.  Patient affirms current coverage meets needs.  Patient's preferred pharmacy is:  [id number] [place], [place] [address]Phone: [phone]: [phone] [place] [address] [place]Phone: [phone]: [phone] [place]([facility] [place]Phone: [phone]: [address]Phone: [phone]: [phone] Uses pill box? No - takes at bedside Pt endorses 100% compliance  Follow Up:  Patient agrees to Care Plan and Follow-up.  Plan: Telephone follow up appointment with care management team member scheduled for:   6-8 months  KLarinda Buttery PharmD Clinical Pharmacist [phone]

## 2021-02-07 NOTE — Patient Instructions (Signed)
Visit Information  Thank you for taking time to visit with me today. Please don't hesitate to contact me if I can be of assistance to you before our next scheduled telephone appointment.  Following are the goals we discussed today:  Patient Goals/Self-Care Activities Over the next 180 days, patient will:  take medications as prescribed  Follow Up Plan: Telephone follow up appointment with care management team member scheduled for:  6-8 months   Please call the care guide team at [phone] if you need to cancel or reschedule your appointment.    Patient verbalizes understanding of instructions provided today and agrees to view in [place].   Darius Bump

## 2021-05-16 ENCOUNTER — Other Ambulatory Visit: Payer: Self-pay | Admitting: Sports Medicine

## 2021-06-18 ENCOUNTER — Ambulatory Visit (INDEPENDENT_AMBULATORY_CARE_PROVIDER_SITE_OTHER): Payer: PPO

## 2021-06-18 ENCOUNTER — Ambulatory Visit (INDEPENDENT_AMBULATORY_CARE_PROVIDER_SITE_OTHER): Payer: PPO | Admitting: Sports Medicine

## 2021-06-18 DIAGNOSIS — N139 Obstructive and reflux uropathy, unspecified: Secondary | ICD-10-CM | POA: Diagnosis not present

## 2021-06-18 DIAGNOSIS — M6530 Trigger finger, unspecified finger: Secondary | ICD-10-CM | POA: Diagnosis not present

## 2021-06-18 NOTE — Assessment & Plan Note (Signed)
Eaton returns, he is a pleasant 66 year old male, retired, plays some golf occasionally. ?Unfortunately he has been having increasing pain palmar third and fourth fingers right hand. ?Occasional triggering, on exam he does have palpable flexor tendon nodules as well as Dupuytren's contracture, I do not think the Dupuytren's contracture is clinically relevant here. ?He tells me he has been doing the trigger finger exercises already so we proceeded with right third and fourth flexor tendon sheath injections, return in 6 weeks. ?

## 2021-06-18 NOTE — Progress Notes (Signed)
? ? ?  Procedures performed today:   ? ?Procedure: Real-time Ultrasound Guided injection of the right third flexor tendon sheath ?Device: Samsung HS60  ?Verbal informed consent obtained.  ?Time-out conducted.  ?Noted no overlying erythema, induration, or other signs of local infection.  ?Skin prepped in a sterile fashion.  ?Local anesthesia: Topical Ethyl chloride.  ?With sterile technique and under real time ultrasound guidance: 1/2 cc lidocaine, 1/2 cc kenalog 40 injected into a somewhat boggy, swollen flexor tendon sheath. ?Completed without difficulty  ?Advised to call if fevers/chills, erythema, induration, drainage, or persistent bleeding.  ?Images permanently stored and available for review in PACS.  ?Impression: Technically successful ultrasound guided injection. ? ?Procedure: Real-time Ultrasound Guided injection of the right fourth flexor tendon sheath ?Device: Samsung HS60  ?Verbal informed consent obtained.  ?Time-out conducted.  ?Noted no overlying erythema, induration, or other signs of local infection.  ?Skin prepped in a sterile fashion.  ?Local anesthesia: Topical Ethyl chloride.  ?With sterile technique and under real time ultrasound guidance: 1/2 cc lidocaine, 1/2 cc kenalog 40 injected into a somewhat boggy, swollen flexor tendon sheath. ?Completed without difficulty  ?Advised to call if fevers/chills, erythema, induration, drainage, or persistent bleeding.  ?Images permanently stored and available for review in PACS.  ?Impression: Technically successful ultrasound guided injection. ? ?Independent interpretation of notes and tests performed by another provider:  ? ?None. ? ?Brief History, Exam, Impression, and Recommendations:   ? ?Trigger finger, right third and fourth fingers ?Cody Kane returns, he is a pleasant 66 year old male, retired, plays some golf occasionally. ?Unfortunately he has been having increasing pain palmar third and fourth fingers right hand. ?Occasional triggering, on exam he  does have palpable flexor tendon nodules as well as Dupuytren's contracture, I do not think the Dupuytren's contracture is clinically relevant here. ?He tells me he has been doing the trigger finger exercises already so we proceeded with right third and fourth flexor tendon sheath injections, return in 6 weeks. ? ?Obstructive uropathy ?Persistent symptoms in spite of multiple medications, he does have a couple of urologists, I have advised him is probably time to proceed with TURP. ? ? ? ?___________________________________________ ?Gwen Her. Dianah Field, M.D., ABFM., CAQSM. ?Primary Care and Sports Medicine ?[place] ? ?Adjunct Instructor of Family Medicine  ?[employer] ?

## 2021-06-18 NOTE — Assessment & Plan Note (Signed)
Persistent symptoms in spite of multiple medications, he does have a couple of urologists, I have advised him is probably time to proceed with TURP. ?

## 2021-07-30 ENCOUNTER — Ambulatory Visit (INDEPENDENT_AMBULATORY_CARE_PROVIDER_SITE_OTHER): Payer: PPO | Admitting: Sports Medicine

## 2021-07-30 DIAGNOSIS — M6530 Trigger finger, unspecified finger: Secondary | ICD-10-CM

## 2021-07-30 NOTE — Assessment & Plan Note (Signed)
Cody Kane returns, he is a very pleasant 66 year old retired male, occasionally plays golf, does have a Dupuytren's contracture, he also had occasional triggering third and fourth fingers, we did bilateral trigger finger injections, pain is gone, occasional triggering, no numbness, tingling, pain. I gave him some additional home hand therapy, he will return to see me as needed.

## 2021-07-30 NOTE — Progress Notes (Signed)
    Procedures performed today:    None.  Independent interpretation of notes and tests performed by another provider:   None.  Brief History, Exam, Impression, and Recommendations:    Trigger finger, right third and fourth fingers Cody Kane returns, he is a very pleasant 66 year old retired male, occasionally plays golf, does have a Dupuytren's contracture, he also had occasional triggering third and fourth fingers, we did bilateral trigger finger injections, pain is gone, occasional triggering, no numbness, tingling, pain. I gave him some additional home hand therapy, he will return to see me as needed.    ___________________________________________ Gwen Her. Dianah Field, M.D., ABFM., CAQSM. Primary Care and [place] Instructor of [place] of [facility] [employer] of Medicine

## 2021-08-12 DIAGNOSIS — N401 Enlarged prostate with lower urinary tract symptoms: Secondary | ICD-10-CM | POA: Diagnosis not present

## 2021-08-12 DIAGNOSIS — R972 Elevated prostate specific antigen [PSA]: Secondary | ICD-10-CM | POA: Diagnosis not present

## 2021-08-12 DIAGNOSIS — R3912 Poor urinary stream: Secondary | ICD-10-CM | POA: Diagnosis not present

## 2021-08-13 ENCOUNTER — Ambulatory Visit (INDEPENDENT_AMBULATORY_CARE_PROVIDER_SITE_OTHER): Payer: PPO | Admitting: Medical-Surgical

## 2021-08-13 VITALS — BP 118/60 | HR 78 | Ht 70.0 in | Wt 177.1 lb

## 2021-08-13 DIAGNOSIS — Z0000 Encounter for general adult medical examination without abnormal findings: Secondary | ICD-10-CM | POA: Diagnosis not present

## 2021-08-13 NOTE — Patient Instructions (Addendum)
[place] Maintenance Summary and Written Plan of Care  Mr. Cody Kane ,  Thank you for allowing me to perform your Medicare Annual Wellness Visit and for your ongoing commitment to your health.   Health Maintenance & Immunization History Health Maintenance  Topic Date Due   COVID-19 Vaccine (4 - Booster for Moderna series) 08/29/2021 (Originally 02/02/2020)   Zoster Vaccines- Shingrix (2 of 2) 11/13/2021 (Originally 01/25/2021)   Pneumonia Vaccine 71+ Years old (2 - PPSV23 if available, else PCV20) 08/14/2022 (Originally 12/13/2019)   INFLUENZA VACCINE  10/01/2021   COLONOSCOPY (Pts 45-44yr Insurance coverage will need to be confirmed)  07/22/2026   TETANUS/TDAP  09/01/2027   Hepatitis C Screening  Completed   HPV VACCINES  Aged Out   Immunization History  Administered Date(s) Administered   Fluad Quad(high Dose 65+) 11/30/2020   Influenza Split 10/16/2017   Influenza,inj,Quad PF,6+ Mos 11/29/2014, 12/03/2015, 12/23/2016, 10/16/2017, 11/30/2018, 03/29/2020   Moderna Sars-Covid-2 Vaccination 04/20/2019, 05/19/2019, 12/08/2019   Pneumococcal Conjugate-13 01/17/2019, 10/18/2019   Tdap 05/19/2014, 08/31/2017   Zoster Recombinat (Shingrix) 11/30/2020   Zoster, Live 06/29/2015    These are the patient goals that we discussed:  Goals Addressed               This Visit's Progress     Patient Stated (pt-stated)        Would like to be stay healthy.         This is a list of Health Maintenance Items that are overdue or due now: Pneumococcal vaccine  2nd dose of the Shingrix vaccine    Orders/Referrals Placed Today: No orders of the defined types were placed in this encounter.  (Contact our referral department at [phone] you have not spoken with someone about your referral appointment within the next 5 days)    Follow-up Plan Follow-up with TSilverio Decamp MD as planned Please bring your immunization record from the [facility]   Medicare wellness visit in one year.  AVS printed and given to the patient.        Health Maintenance, Male Adopting a healthy lifestyle and getting preventive care are important in promoting health and wellness. Ask your health care provider about: The right schedule for you to have regular tests and exams. Things you can do on your own to prevent diseases and keep yourself healthy. What should I know about diet, weight, and exercise? Eat a healthy diet  Eat a diet that includes plenty of vegetables, fruits, low-fat dairy products, and lean protein. Do not eat a lot of foods that are high in solid fats, added sugars, or sodium. Maintain a healthy weight Body mass index (BMI) is a measurement that can be used to identify possible weight problems. It estimates body fat based on height and weight. Your health care provider can help determine your BMI and help you achieve or maintain a healthy weight. Get regular exercise Get regular exercise. This is one of the most important things you can do for your health. Most adults should: Exercise for at least 150 minutes each week. The exercise should increase your heart rate and make you sweat (moderate-intensity exercise). Do strengthening exercises at least twice a week. This is in addition to the moderate-intensity exercise. Spend less time sitting. Even light physical activity can be beneficial. Watch cholesterol and blood lipids Have your blood tested for lipids and cholesterol at 67years of age, then have this test every 5 years. You may need to have  your cholesterol levels checked more often if: Your lipid or cholesterol levels are high. You are older than 67 years of age. You are at high risk for heart disease. What should I know about cancer screening? Many types of cancers can be detected early and may often be prevented. Depending on your health history and family history, you may need to have cancer screening at various ages. This  may include screening for: Colorectal cancer. Prostate cancer. Skin cancer. Lung cancer. What should I know about heart disease, diabetes, and high blood pressure? Blood pressure and heart disease High blood pressure causes heart disease and increases the risk of stroke. This is more likely to develop in people who have high blood pressure readings or are overweight. Talk with your health care provider about your target blood pressure readings. Have your blood pressure checked: Every 3-5 years if you are 65-10 years of age. Every year if you are 36 years old or older. If you are between the ages of 43 and 89 and are a current or former smoker, ask your health care provider if you should have a one-time screening for abdominal aortic aneurysm (AAA). Diabetes Have regular diabetes screenings. This checks your fasting blood sugar level. Have the screening done: Once every three years after age 36 if you are at a normal weight and have a low risk for diabetes. More often and at a younger age if you are overweight or have a high risk for diabetes. What should I know about preventing infection? Hepatitis B If you have a higher risk for hepatitis B, you should be screened for this virus. Talk with your health care provider to find out if you are at risk for hepatitis B infection. Hepatitis C Blood testing is recommended for: Everyone born from [birth year] through 1966. Anyone with known risk factors for hepatitis C. Sexually transmitted infections (STIs) You should be screened each year for STIs, including gonorrhea and chlamydia, if: You are sexually active and are younger than 67 years of age. You are older than 67 years of age and your health care provider tells you that you are at risk for this type of infection. Your sexual activity has changed since you were last screened, and you are at increased risk for chlamydia or gonorrhea. Ask your health care provider if you are at risk. Ask your health  care provider about whether you are at high risk for HIV. Your health care provider may recommend a prescription medicine to help prevent HIV infection. If you choose to take medicine to prevent HIV, you should first get tested for HIV. You should then be tested every 3 months for as long as you are taking the medicine. Follow these instructions at home: Alcohol use Do not drink alcohol if your health care provider tells you not to drink. If you drink alcohol: Limit how much you have to 0-2 drinks a day. Know how much alcohol is in your drink. In the [place] one drink equals one 12 oz bottle of beer (355 mL), one 5 oz glass of wine (148 mL), or one 1 oz glass of hard liquor (44 mL). Lifestyle Do not use any products that contain nicotine or tobacco. These products include cigarettes, chewing tobacco, and vaping devices, such as e-cigarettes. If you need help quitting, ask your health care provider. Do not use street drugs. Do not share needles. Ask your health care provider for help if you need support or information about quitting drugs. General instructions Schedule  regular health, dental, and eye exams. Stay current with your vaccines. Tell your health care provider if: You often feel depressed. You have ever been abused or do not feel safe at home. Summary Adopting a healthy lifestyle and getting preventive care are important in promoting health and wellness. Follow your health care provider's instructions about healthy diet, exercising, and getting tested or screened for diseases. Follow your health care provider's instructions on monitoring your cholesterol and blood pressure. This information is not intended to replace advice given to you by your health care provider. Make sure you discuss any questions you have with your health care provider. Document Revised: 07/09/2020 Document Reviewed: 07/09/2020 Elsevier Patient Education  [place].

## 2021-08-13 NOTE — Progress Notes (Signed)
MEDICARE ANNUAL WELLNESS VISIT  08/13/2021  Subjective:  Cody Kane is a 66 y.o. male patient of Thekkekandam, Gwen Her, MD who had a Medicare Annual Wellness Visit today. [name] is Retired and lives with their spouse. he has 1 child. he reports that he is socially active and does interact with friends/family regularly. he is moderately physically active and enjoys playing golf.  Patient Care Team: Silverio Decamp, MD as PCP - General (Family Medicine) Darius Bump, [facility] as Pharmacist (Pharmacist)     08/13/2021    3:56 PM 01/09/2020    3:12 PM 05/19/2014   12:11 PM 01/10/2014    7:08 AM 12/02/2013    7:23 AM 08/25/2013    8:51 AM  Advanced Directives  Does Patient Have a Medical Advance Directive? Yes Yes No No No Patient does not have advance directive;Patient would not like information  Type of Advance Directive Living will [place];Living will      Does patient want to make changes to medical advance directive? No - Patient declined       Copy of [place] in Chart?  No - copy requested      Would patient like information on creating a medical advance directive?   No - patient declined information No - patient declined information No - patient declined information     Hospital Utilization Over the Past 12 Months: # of hospitalizations or ER visits: 0 # of surgeries: 0  Review of Systems    Patient reports that his overall health is worse when compared to last year.  Review of Systems: History obtained from chart review and the patient  All other systems negative.  Pain Assessment Pain : No/denies pain     Current Medications & Allergies (verified) Allergies as of 08/13/2021       Reactions   Flomax [tamsulosin]    Statins Other (See Comments)   Myalgias and polyuria, with atorvastatin and Crestor   Finasteride Rash        Medication List        Accurate as of August 13, 2021  4:10 PM. If you have any  questions, ask your nurse or doctor.          dutasteride 0.5 MG capsule Commonly known as: AVODART Take 0.5 mg by mouth daily.   esomeprazole 40 MG capsule Commonly known as: NEXIUM TAKE 1 CAPSULE (40 MG TOTAL) BY MOUTH DAILY.   hydrocodone-ibuprofen 5-200 MG tablet Commonly known as: VICOPROFEN Take 1 tablet by mouth every 8 (eight) hours as needed for pain.   lidocaine 5 % Commonly known as: LIDODERM APPLY 1 PATCH TO SKIN ONCE DAILY (APPLY FOR 12 HOURS, THEN REMOVE FOR 12 HOURS) APPLY TO PAINFUL AREAS  JOINTS  NECK/BACK   nicotine 14 mg/24hr patch Commonly known as: NICODERM CQ - dosed in mg/24 hours   predniSONE 5 MG tablet Commonly known as: DELTASONE Take 1 tablet (5 mg total) by mouth daily with breakfast.   rizatriptan 5 MG disintegrating tablet Commonly known as: Maxalt-MLT Take 1 tablet (5 mg total) by mouth as needed for migraine. May repeat in 2 hours if needed   valACYclovir 1000 MG tablet Commonly known as: VALTREX Take 1 tablet (1,000 mg total) by mouth 2 (two) times daily as needed.        History (reviewed): Past Medical History:  Diagnosis Date   Arthritis    BPH (benign prostatic hypertrophy)    Elevated PSA  GERD (gastroesophageal reflux disease)    Lower urinary tract symptoms (LUTS)    Mild obstructive sleep apnea    pt just had study done--  to start using cpap next week   Past Surgical History:  Procedure Laterality Date   COLONOSCOPY  2011   CYSTOSCOPY N/A 12/02/2013   Procedure: CYSTOSCOPY;  Surgeon: Festus Aloe, MD;  Location[facility];  Service: Urology;  Laterality: N/A;   GREEN LIGHT LASER TURP (TRANSURETHRAL RESECTION OF PROSTATE N/A 01/10/2014   Procedure: GREEN LIGHT LASER TURP (TRANSURETHRAL RESECTION OF PROSTATE;  Surgeon: Festus Aloe, MD;  Location: [facility];  Service: Urology;  Laterality: N/A;   PROSTATE BIOPSY N/A 12/02/2013   Procedure: BIOPSY TRANSRECTAL ULTRASONIC  PROSTATE (TUBP);  Surgeon: Festus Aloe, MD;  [facility];  Service: Urology;  Laterality: N/A;   Family History  Problem Relation Age of Onset   Cancer Mother        colon   Cancer Father        lung   Social History   Socioeconomic History   Marital status: Married    Spouse name: [name]   Number of children: 1   Years of education: [employer] education level: Some college, no degree  Occupational History    Comment: Administration    Occupation: Retired  Tobacco Use   Smoking status: Every Day    Packs/day: 1.00    Years: 25.00    Total pack years: 25.00    Types: Cigarettes   Smokeless tobacco: Never  Substance and Sexual Activity   Alcohol use: Yes    Alcohol/week: 0.0 standard drinks of alcohol    Comment: occasional   Drug use: No   Sexual activity: Not on file  Other Topics Concern   Not on file  Social History Narrative   Lives with his spouse. Enjoys playing golf.   Social Determinants of Health   Financial Resource Strain: Low Risk  (08/13/2021)   Overall Financial Resource Strain (CARDIA)    Difficulty of Paying Living Expenses: Not hard at all  Food Insecurity: No Food Insecurity (08/13/2021)   Hunger Vital Sign    Worried About Running Out of Food in the Last Year: Never true    Ran Out of Food in the Last Year: Never true  Transportation Needs: No Transportation Needs (08/13/2021)   PRAPARE - Hydrologist (Medical): No    Lack of Transportation (Non-Medical): No  Physical Activity: Sufficiently Active (08/13/2021)   Exercise Vital Sign    Days of Exercise per Week: 4 days    Minutes of Exercise per Session: 150+ min  Stress: Stress Concern Present (08/13/2021)   [place]    Feeling of Stress : To some extent  Social Connections: Socially Isolated (08/13/2021)   Social Connection and Isolation Panel [NHANES]     Frequency of Communication with Friends and Family: Once a week    Frequency of Social Gatherings with Friends and Family: Once a week    Attends Religious Services: Never    Marine scientist or Organizations: No    Attends Archivist Meetings: Never    Marital Status: Married    Activities of Daily Living    08/13/2021    3:59 PM  In your present state of health, do you have any difficulty performing the following activities:  Hearing? 0  Comment wears bilateral  hearing aids.  Vision? 0  Comment wears glasses.  Difficulty concentrating or making decisions? 1  Comment sometimes  Walking or climbing stairs? 0  Dressing or bathing? 0  Doing errands, shopping? 0  Preparing Food and eating ? N  Using the Toilet? N  In the past six months, have you accidently leaked urine? N  Do you have problems with loss of bowel control? N  Managing your Medications? N  Managing your Finances? N  Housekeeping or managing your Housekeeping? N    Patient Education/Literacy How often do you need to have someone help you when you read instructions, pamphlets, or other written materials from your doctor or pharmacy?: 1 - Never What is the last grade level you completed in school?: Some college  Exercise Current Exercise Habits: Home exercise routine, Type of exercise: Other - see comments (playing golf), Time (Minutes): > 60, Frequency (Times/Week): 4, Weekly Exercise (Minutes/Week): 0, Intensity: Moderate, Exercise limited by: None identified  Diet Patient reports consuming 2 meals a day and 1 snack(s) a day Patient reports that his primary diet is: Regular Patient reports that she does have regular access to food.   Depression Screen    08/13/2021    3:56 PM 06/18/2021    3:33 PM 10/19/2020    4:11 PM 02/07/2020    9:14 AM 10/18/2019   10:14 AM 01/08/2018    9:36 AM 12/10/2017    8:49 AM  PHQ 2/9 Scores  PHQ - 2 Score 0 0 2 0 '2 5 3  '$ PHQ- 9 Score 0 0 5 0  22 20  Exception  Documentation    Other- indicate reason in comment box     Not completed    Depression in complete remission, mood good        Fall Risk    08/13/2021    3:56 PM 06/18/2021    3:33 PM 02/07/2020    9:14 AM 10/18/2019   10:20 AM  Fall Risk   Falls in the past year? 1 0 0 0  Number falls in past yr: 1 0 0   Injury with Fall? 0 0 0   Risk for fall due to : History of fall(s)     Follow up Falls evaluation completed        Objective:   BP 118/60 (BP Location: Right Arm, Patient Position: Sitting, Cuff Size: Normal)   Pulse 78   Ht '5\' 10"'$  (1.778 m)   Wt 177 lb 1.9 oz (80.3 kg)   SpO2 98%   BMI 25.41 kg/m   Last Weight  Most recent update: 08/13/2021  3:51 PM    Weight  80.3 kg (177 lb 1.9 oz)             Body mass index is 25.41 kg/m.  Hearing/Vision  Priest did not have difficulty with hearing/understanding during the face-to-face interview [place] did not have difficulty with his vision during the face-to-face interview Reports that he has had a formal eye exam by an eye care professional within the past year Reports that he has not had a formal hearing evaluation within the past year  Cognitive Function:    08/13/2021    4:01 PM  6CIT Screen  What Year? 0 points  What month? 0 points  What time? 0 points  Count back from 20 0 points  Months in reverse 0 points  Repeat phrase 0 points  Total Score 0 points    Normal Cognitive Function Screening: Yes (Normal:0-7, Significant  for Dysfunction: >8)  Immunization & Health Maintenance Record Immunization History  Administered Date(s) Administered   Fluad Quad(high Dose 65+) 11/30/2020   Influenza Split 10/16/2017   Influenza,inj,Quad PF,6+ Mos 11/29/2014, 12/03/2015, 12/23/2016, 10/16/2017, 11/30/2018, 03/29/2020   Moderna Sars-Covid-2 Vaccination 04/20/2019, 05/19/2019, 12/08/2019   Pneumococcal Conjugate-13 01/17/2019, 10/18/2019   Tdap 05/19/2014, 08/31/2017   Zoster Recombinat (Shingrix) 11/30/2020    Zoster, Live 06/29/2015    Health Maintenance  Topic Date Due   COVID-19 Vaccine (4 - Booster for Moderna series) 08/29/2021 (Originally 02/02/2020)   Zoster Vaccines- Shingrix (2 of 2) 11/13/2021 (Originally 01/25/2021)   Pneumonia Vaccine 82+ Years old (2 - PPSV23 if available, else PCV20) 08/14/2022 (Originally 12/13/2019)   INFLUENZA VACCINE  10/01/2021   COLONOSCOPY (Pts 45-38yr Insurance coverage will need to be confirmed)  07/22/2026   TETANUS/TDAP  09/01/2027   Hepatitis C Screening  Completed   HPV VACCINES  Aged Out       Assessment  This is a routine wellness examination for [employer]  Health Maintenance: Due or Overdue There are no preventive care reminders to display for this patient.   MTeagen Mclearydoes not need a referral for Community Assistance: Care Management:   no Social Work:    no Prescription Assistance:  no Nutrition/Diabetes Education:  no   Plan:  Personalized Goals  Goals Addressed               This Visit's Progress     Patient Stated (pt-stated)        Would like to be stay healthy.       Personalized Health Maintenance & Screening Recommendations  Pneumococcal vaccine  2nd dose of the Shingrix vaccine  Lung Cancer Screening Recommended: no (Low Dose CT Chest recommended if Age 67-80years, 30 pack-year currently smoking OR have quit w/in past 15 years) Hepatitis C Screening recommended: no HIV Screening recommended: no  Advanced Directives: Written information was not given per the patient's request.  Referrals & Orders No orders of the defined types were placed in this encounter.   Follow-up Plan Follow-up with TSilverio Decamp MD as planned Please bring your immunization record from the [facility]  Medicare wellness visit in one year.  AVS printed and given to the patient.   I have personally reviewed and noted the following in the patient's chart:   Medical and social history Use of alcohol, tobacco or  illicit drugs  Current medications and supplements Functional ability and status Nutritional status Physical activity Advanced directives List of other physicians Hospitalizations, surgeries, and ER visits in previous 12 months Vitals Screenings to include cognitive, depression, and falls Referrals and appointments  In addition, I have reviewed and discussed with patient certain preventive protocols, quality metrics, and best practice recommendations. A written personalized care plan for preventive services as well as general preventive health recommendations were provided to patient.     BTinnie Gens RN-BSN  08/13/2021

## 2021-10-22 ENCOUNTER — Ambulatory Visit (INDEPENDENT_AMBULATORY_CARE_PROVIDER_SITE_OTHER): Payer: PPO | Admitting: Pharmacist

## 2021-10-22 DIAGNOSIS — Z87891 Personal history of nicotine dependence: Secondary | ICD-10-CM

## 2021-10-22 DIAGNOSIS — E7800 Pure hypercholesterolemia, unspecified: Secondary | ICD-10-CM

## 2021-10-22 NOTE — Progress Notes (Unsigned)
Chronic Care Management Pharmacy Note  10/23/2021 Name:  Cody Kane MRN:  [id number] DOB:  Jul 28, 1955  Summary: addressed HLD, chronic pain. Tried rosuvastatin, patient described muscle pains and intolerance. Discussed zetia and other cholesterol options, declines all medication for cholesterol at this time & prefers to monitor with lifestyle management.  Counseled on various options for tobacco cessation.   Recommendations/Changes made from today's visit:  - Recommend utilizing two methods (patch PLUS either lozenge or gum OTC as current usage correlates with data suggesting dual approach more effective).  - No other medication changes or recs at this time.   Plan: patient can follow up PRN with pharmacist in future if desired  Subjective: Cody Kane is an 66 y.o. year old male who is a primary patient of Thekkekandam, Gwen Her, MD.  The CCM team was consulted for assistance with disease management and care coordination needs.    Engaged with patient by telephone for follow up visit in response to provider referral for pharmacy case management and/or care coordination services.   Consent to Services:  The patient was given information about Chronic Care Management services, agreed to services, and gave verbal consent prior to initiation of services.  Please see initial visit note for detailed documentation.   Patient Care Team: Silverio Decamp, MD as PCP - General (Family Medicine) Darius Bump, [facility] as Pharmacist (Pharmacist)  Recent office visits:  10/19/20-Cody J. Dianah Field, MD (PCP) Annual exam visit. Labs ordered. Follow up in 1 year.   Recent consult visits:  12/07/20-Rebecca Blenda Mounts, MD (Podiatry) Seen for foot pain. Injection offered today. Patient deferred. Follow up in 6 weeks. 11/30/20-Ronald Foye Deer ([facility])  11/27/20-Amy Mateo Flow [facility])  10/22/20-Marian Guadalupe Dawn [facility])  08/11/22Konstantinos  Votanopoulos [facility])  10/04/20-Larry Ludger Nutting (Nurse Practitioner) Notes not available. 08/13/20-Joseph Linden Dolin [facility])    Hospital visits:  None in previous 6 months  Objective:  Lab Results  Component Value Date   CREATININE 0.89 10/22/2020   CREATININE 0.96 10/19/2019   CREATININE 1.11 06/13/2016    Lab Results  Component Value Date   HGBA1C 5.3 06/13/2016      Component Value Date/Time   CHOL 249 (H) 10/22/2020 0000   TRIG 162 (H) 10/22/2020 0000   HDL 43 10/22/2020 0000   CHOLHDL 5.8 (H) 10/22/2020 0000   VLDL 24 07/02/2015 0839   LDLCALC 175 (H) 10/22/2020 0000       Latest Ref Rng & Units 10/22/2020   12:00 AM 10/19/2019    7:47 AM 06/13/2016    8:28 AM  Hepatic Function  Total Protein 6.1 - 8.1 g/dL 7.1  6.6  6.7   Albumin 3.6 - 5.1 g/dL   4.1   AST 10 - 35 U/L '16  17  23   ' ALT 9 - 46 U/L 14  19  33   Alk Phosphatase 40 - 115 U/L   64   Total Bilirubin 0.2 - 1.2 mg/dL 0.6  0.6  0.5     Lab Results  Component Value Date/Time   TSH 0.41 10/22/2020 12:00 AM   TSH 0.62 10/19/2019 07:47 AM       Latest Ref Rng & Units 10/22/2020   12:00 AM 10/19/2019    7:47 AM 06/13/2016    8:28 AM  CBC  WBC 3.8 - 10.8 Thousand/uL 6.6  7.8  5.8   Hemoglobin 13.2 - 17.1 g/dL 14.6  13.8  14.7   Hematocrit 38.5 - 50.0 % 44.3  41.9  43.2   Platelets 140 - 400 Thousand/uL 229  206  212     Lab Results  Component Value Date/Time   VD25OH 28 (L) 07/02/2015 08:39 AM   VD25OH 35 10/28/2013 11:32 AM    Clinical ASCVD: Yes  The 10-year ASCVD risk score (Arnett DK, et al., 2019) is: 17.4%*   Values used to calculate the score:     Age: 76 years     Sex: Male     Is Non-Hispanic African American: No     Diabetic: No     Tobacco smoker: Yes     Systolic Blood Pressure: 161 mmHg     Is BP treated: No     HDL Cholesterol: 59 mg/dL*     Total Cholesterol: 244 mg/dL*     * - Cholesterol units were assumed for this score calculation    Other:  (CHADS2VASc if Afib, PHQ9 if depression, MMRC or CAT for COPD, ACT, DEXA)  Social History   Tobacco Use  Smoking Status Every Day   Packs/day: 1.00   Years: 25.00   Total pack years: 25.00   Types: Cigarettes  Smokeless Tobacco Never   BP Readings from Last 3 Encounters:  08/13/21 118/60  01/11/21 123/71  10/19/20 (!) 107/54   Pulse Readings from Last 3 Encounters:  08/13/21 78  01/11/21 72  10/19/20 75   Wt Readings from Last 3 Encounters:  08/13/21 177 lb 1.9 oz (80.3 kg)  01/11/21 177 lb (80.3 kg)  10/19/20 178 lb (80.7 kg)    Assessment: Review of patient past medical history, allergies, medications, health status, including review of consultants reports, laboratory and other test data, was performed as part of comprehensive evaluation and provision of chronic care management services.   SDOH:  (Social Determinants of Health) assessments and interventions performed:    CCM Care Plan  Allergies  Allergen Reactions   Flomax [Tamsulosin]    Statins Other (See Comments)    Myalgias and polyuria, with atorvastatin and Crestor   Finasteride Rash    Medications Reviewed Today     Reviewed by Darius Bump, [facility] (Pharmacist) on 10/22/21 at 1408  Med List Status: <None>   Medication Order Taking? Sig Documenting Provider Last Dose Status Informant  dutasteride (AVODART) 0.5 MG capsule [id number] No Take 0.5 mg by mouth daily.  Patient not taking: Reported on 10/22/2021   [provider] Not Taking Active   esomeprazole (NEXIUM) 40 MG capsule [id number] Yes TAKE 1 CAPSULE (40 MG TOTAL) BY MOUTH DAILY. Silverio Decamp, MD Taking Active   hydrocodone-ibuprofen (VICOPROFEN) 5-200 MG tablet [id number] Yes Take 1 tablet by mouth every 8 (eight) hours as needed for pain. Silverio Decamp, MD Taking Active   lidocaine (LIDODERM) 5 % [id number] Yes APPLY 1 PATCH TO SKIN ONCE DAILY (APPLY FOR 12 HOURS, THEN REMOVE FOR 12 HOURS) APPLY TO PAINFUL AREAS  JOINTS   NECK/BACK [provider] Taking Active   nicotine (NICODERM CQ - DOSED IN MG/24 HOURS) 14 mg/24hr patch [id number] No   Patient not taking: Reported on 10/22/2021   [provider] Not Taking Active   predniSONE (DELTASONE) 5 MG tablet [id number] Yes Take 1 tablet (5 mg total) by mouth daily with breakfast. Silverio Decamp, MD Taking Active   rizatriptan (MAXALT-MLT) 5 MG disintegrating tablet [id number] Yes Take 1 tablet (5 mg total) by mouth as needed for migraine. May repeat in 2 hours if needed Silverio Decamp, MD Taking Active  valACYclovir (VALTREX) 1000 MG tablet [id number] Yes Take 1 tablet (1,000 mg total) by mouth 2 (two) times daily as needed. Silverio Decamp, MD Taking Active             Patient Active Problem List   Diagnosis Date Noted   Trigger finger, right third and fourth fingers 06/18/2021   Acute prostatitis 12/07/2020   Basophilia 12/07/2020   Benign prostatic hyperplasia 12/07/2020   Bilateral pseudophakia 12/07/2020   Chronic low back pain 12/07/2020   Cigar smoker 12/07/2020   Dizziness and giddiness 12/07/2020   Elevated prostate specific antigen (PSA) 12/07/2020   Encounter for fitting and adjustment of hearing aid 12/07/2020   Gross hematuria 12/07/2020   Malignant melanoma of skin of forearm ([place]) 12/07/2020   Malignant melanoma of unspecified upper limb, including shoulder ([place]) 12/07/2020   Neoplasm of uncertain behavior of skin 12/07/2020   Palmar fascial fibromatosis (dupuytren) 12/07/2020   Personal history of malignant melanoma of skin 12/07/2020   Poikiloderma of [place] 12/07/2020   Pure hypercholesterolemia, unspecified 12/07/2020   Rheumatoid arthritis, unspecified ([place]) 12/07/2020   Sensorineural hearing loss, bilateral 12/07/2020   Tinnitus, bilateral 12/07/2020   Hematuria 10/19/2020   Right foot pain 10/19/2020   Lumbar degenerative disc disease 12/27/2019   Non-healing skin lesion of nose 09/14/2017    Photosensitivity dermatitis 08/17/2017   Long-term use of high-risk medication 03/30/2017   Rheumatoid arthritis involving both hands with negative rheumatoid factor ([place]) 02/26/2017   Cubital tunnel syndrome, left 12/23/2016   Left hip pain 02/01/2016   Anxiety and depression 07/19/2015   Osteopenia 05/10/2015   Vestibular migraine 01/17/2015   Acute pain due to injury 05/22/2014   Blisters with epidermal loss due to burn (second degree) of lower limb (leg) 05/22/2014   Burn any degree involving less than 10 percent of body surface 05/22/2014   Itch 05/22/2014   Insomnia 03/09/2014   Sinusitis, chronic 12/15/2013   Herpes genitalis 07/22/2012   Hyperlipidemia 07/22/2012   Annual physical exam 07/21/2012   Obstructive uropathy 07/21/2012   History of smoking 07/21/2012   Primary osteoarthritis of right shoulder 03/24/2012   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 08/19/2011   GERD (gastroesophageal reflux disease) 08/19/2011    Immunization History  Administered Date(s) Administered   Fluad Quad(high Dose 65+) 11/30/2020   Influenza Split 10/16/2017   Influenza,inj,Quad PF,6+ Mos 11/29/2014, 12/03/2015, 12/23/2016, 10/16/2017, 11/30/2018, 03/29/2020   Moderna Sars-Covid-2 Vaccination 04/20/2019, 05/19/2019, 12/08/2019   Pneumococcal Conjugate-13 01/17/2019, 10/18/2019   Tdap 05/19/2014, 08/31/2017   Zoster Recombinat (Shingrix) 11/30/2020   Zoster, Live 06/29/2015    Conditions to be addressed/monitored: HLD and chronic pain, tobacco use  Care Plan : Medication Management  Updates made by Darius Bump, [place] since 10/23/2021 12:00 AM     Problem: HLD, chronic pain      Long-Range Goal: Disease Progression Prevention   Start Date: 12/26/2020  Recent Progress: On track  Priority: High  Note:   Current Barriers:  Unable to achieve control of cholesterol due to statin side effects of muscle pains and urinary symptoms    Pharmacist Clinical Goal(s):  Over the next 180 days, patient will adhere to plan to optimize therapeutic regimen for hyperlipidemia as evidenced by report of adherence to recommended medication management changes through collaboration with PharmD and provider.   Interventions: 1:1 collaboration with Silverio Decamp, MD regarding development and update of comprehensive plan of care as evidenced by provider attestation and co-signature Inter-disciplinary care team  collaboration (see longitudinal plan of care) Comprehensive medication review performed; medication list updated in electronic medical record  Hyperlipidemia:  Uncontrolled; current treatment: no current medications;   Previous medications attempted: atorvastatin & rosuvastatin, both with significant muscle pains Recommended lifestyle management, patient declines all cholesterol interventions at this time  Chronic Pain  Controlled; current treatment:fioricet PRN, Fiorinal PRN, Tylenol #3 PRN, and patient states has leftover percocet from an old injury;   Counseled on medication safety and bowel regimen Recommended continue current regimen  Tobacco Use:  1-/2 - 3/4 packs per day; 48 years of use; 8 months since last quit attempt, Does smoke within 30 minutes of waking up  Previous quit attempts:  "probably a dozen times" unsuccessful using nicotine patch   Triggers to smoke: stress, situational  Motivation to quit smoking: expensive habit  On a scale of 1-10, how IMPORTANT is it for you to quit smoking: 7-8  On a scale of 1-10, how CONFIDENT are you that you can quit smoking: 9-10  Counseled on various options & importance of utilizing 2 methods (patch PLUS gum or lozenge)  Recommended patient utilize nicotine patch + OTC options and reach back out to myself or PCP if further assistance is needed   Patient Goals/Self-Care Activities Over the next 180 days, patient will:  take medications as prescribed  Follow Up  Plan: The patient has been provided with contact information for the care management team and has been advised to call with any health related questions or concerns.        Medication Assistance: None required.  Patient affirms current coverage meets needs.  Patient's preferred pharmacy is:  [id number] [place] [place] [id number]: [phone]: [phone] [place] - [address]: [phone]: [phone] [place] [place]: [phone]: [address]Phone: [phone]: [phone]  Uses pill box? No - takes at bedside Pt endorses 100% compliance  Follow Up:  Patient agrees to Care Plan and Follow-up.  Plan: The patient has been provided with contact information for the care management team and has been advised to call with any health related questions or concerns.   KLarinda Buttery PharmD Clinical Pharmacist [facility] At [phone]

## 2021-10-23 NOTE — Patient Instructions (Signed)
Visit Information  Thank you for taking time to visit with me today. Please don't hesitate to contact me if I can be of assistance to you before our next scheduled telephone appointment.  Following are the goals we discussed today:   Patient Goals/Self-Care Activities Over the next 180 days, patient will:  take medications as prescribed  Follow Up Plan: The patient has been provided with contact information for the care management team and has been advised to call with any health related questions or concerns.   Please call the care guide team at [phone] if you need to cancel or reschedule your appointment.    Patient verbalizes understanding of instructions and care plan provided today and agrees to view in [place]. Active MyChart status and patient understanding of how to access instructions and care plan via MyChart confirmed with patient.     Cody Kane

## 2021-10-31 DIAGNOSIS — E7800 Pure hypercholesterolemia, unspecified: Secondary | ICD-10-CM

## 2021-11-11 ENCOUNTER — Ambulatory Visit (INDEPENDENT_AMBULATORY_CARE_PROVIDER_SITE_OTHER): Payer: PPO | Admitting: Sports Medicine

## 2021-11-11 ENCOUNTER — Ambulatory Visit (INDEPENDENT_AMBULATORY_CARE_PROVIDER_SITE_OTHER): Payer: PPO

## 2021-11-11 DIAGNOSIS — M25512 Pain in left shoulder: Secondary | ICD-10-CM

## 2021-11-11 MED ORDER — OXYCODONE HCL ER 20 MG PO T12A: 20.0000 mg | EXTENDED_RELEASE_TABLET | Freq: Two times a day (BID) | ORAL | 0 refills | Status: DC

## 2021-11-11 NOTE — Progress Notes (Signed)
    Procedures performed today:    Procedure: Real-time Ultrasound Guided injection of the left acromioclavicular joint Device: Samsung HS60  Verbal informed consent obtained.  Time-out conducted.  Noted no overlying erythema, induration, or other signs of local infection.  Skin prepped in a sterile fashion.  Local anesthesia: Topical Ethyl chloride.  With sterile technique and under real time ultrasound guidance: Noted arthritic joint, 1/2 cc lidocaine, 1/2 cc kenalog 40 injected easily. Completed without difficulty  Advised to call if fevers/chills, erythema, induration, drainage, or persistent bleeding.  Images permanently stored and available for review in PACS.  Impression: Technically successful ultrasound guided injection.  Independent interpretation of notes and tests performed by another provider:   None.  Brief History, Exam, Impression, and Recommendations:    Arthralgia of left acromioclavicular joint Pleasant 66 year old male, increasing pain left acromioclavicular joint, present for a week, he is requesting injection today. On exam he has tenderness at the acromioclavicular joint with a positive crossarm sign. Tells me he was given some OxyContin years ago in 2016, that has lasted until recently, asking for refill, happy to do this, he tells me he was taking 20 mg daily, will give him 30 pills, no refills. Injected today, adding x-rays, home conditioning, return to see me in 6 weeks as needed.    ____________________________________________ Gwen Her. Dianah Field, M.D., ABFM., CAQSM., AME. Primary Care and Sports Medicine  [facility]  Adjunct Professor of [place] [employer] [facility] of Medicine  Risk manager

## 2021-11-11 NOTE — Assessment & Plan Note (Addendum)
Pleasant 66 year old male, increasing pain left acromioclavicular joint, present for a week, he is requesting injection today. On exam he has tenderness at the acromioclavicular joint with a positive crossarm sign. Tells me he was given some OxyContin years ago in 2016, that has lasted until recently, asking for refill, happy to do this, he tells me he was taking 20 mg daily, will give him 30 pills, no refills. Injected today, adding x-rays, home conditioning, return to see me in 6 weeks as needed.

## 2021-11-12 ENCOUNTER — Ambulatory Visit: Payer: PPO | Admitting: Sports Medicine

## 2021-11-12 ENCOUNTER — Ambulatory Visit (INDEPENDENT_AMBULATORY_CARE_PROVIDER_SITE_OTHER): Payer: PPO

## 2021-11-12 DIAGNOSIS — M25512 Pain in left shoulder: Secondary | ICD-10-CM

## 2021-11-12 DIAGNOSIS — M19012 Primary osteoarthritis, left shoulder: Secondary | ICD-10-CM | POA: Diagnosis not present

## 2021-11-13 ENCOUNTER — Other Ambulatory Visit: Payer: Self-pay | Admitting: Sports Medicine

## 2021-11-13 DIAGNOSIS — K219 Gastro-esophageal reflux disease without esophagitis: Secondary | ICD-10-CM

## 2021-11-14 ENCOUNTER — Ambulatory Visit: Payer: PPO | Admitting: Sports Medicine

## 2021-12-04 ENCOUNTER — Encounter: Payer: Self-pay | Admitting: Sports Medicine

## 2021-12-04 DIAGNOSIS — M25512 Pain in left shoulder: Secondary | ICD-10-CM

## 2021-12-04 MED ORDER — OXYCODONE HCL 20 MG PO TABS: 1.0000 | ORAL_TABLET | Freq: Two times a day (BID) | ORAL | 0 refills | Status: DC | PRN

## 2021-12-23 ENCOUNTER — Ambulatory Visit (INDEPENDENT_AMBULATORY_CARE_PROVIDER_SITE_OTHER): Payer: PPO | Admitting: Sports Medicine

## 2021-12-23 ENCOUNTER — Encounter: Payer: Self-pay | Admitting: Sports Medicine

## 2021-12-23 ENCOUNTER — Encounter: Payer: Self-pay | Admitting: Neurology

## 2021-12-23 DIAGNOSIS — E7800 Pure hypercholesterolemia, unspecified: Secondary | ICD-10-CM | POA: Diagnosis not present

## 2021-12-23 DIAGNOSIS — G894 Chronic pain syndrome: Secondary | ICD-10-CM | POA: Insufficient documentation

## 2021-12-23 MED ORDER — PREDNISONE 50 MG PO TABS: ORAL_TABLET | ORAL | 0 refills | Status: DC

## 2021-12-23 NOTE — Progress Notes (Signed)
    Procedures performed today:    None.  Independent interpretation of notes and tests performed by another provider:   None.  Brief History, Exam, Impression, and Recommendations:    Chronic pain syndrome Cody Kane is a pleasant 66 year old male, he has a long history of multiple joint aches and pains, suspected seronegative rheumatoid arthritis. He also has known widespread osteoarthritic changes, as well as lumbar spondylitic changes. He is post several epidurals. He is been on multiple medications including NSAIDs, acetaminophen, steroids multiple times, gabapentin, Lyrica, tricyclics, he has either not been able to tolerate these or nothing has worked. He did have a left upper extremity nerve conduction/EMG that per his report showed carpal tunnel syndrome. His principal complaint is shooting pains both hands worse at night, as well as both feet. We are limited on medications that work. He does have a history of intolerance to statins, he was started on Zetia, took 1 day, and reported increasing pain. I did tell him that Zetia was an unlikely cause of myalgias. For now to get him out of pain we will do a course of prednisone. I do think he needs an updated upper and lower extremity nerve conduction/EMG, and I would like him to go ahead and discuss this with pain management. I did a one-time prescription of oxycodone at the last visit and I do suspect he will be 1 of those patients that needs longer-term chronic narcotics.  Hyperlipidemia Is planning to come off of Zetia, in the future we may need another way to control his lipids, potentially Repatha.  I spent 30 minutes of total time managing this patient today, this includes chart review, face to face, and non-face to face time.  ____________________________________________ Cody Kane, M.D., ABFM., CAQSM., AME. Primary Care and Sports Medicine [place] [facility]  Adjunct Professor of [place] of [facility] of Medicine  Risk manager

## 2021-12-23 NOTE — Assessment & Plan Note (Signed)
Is planning to come off of Zetia, in the future we may need another way to control his lipids, potentially Repatha.

## 2021-12-23 NOTE — Assessment & Plan Note (Addendum)
Cody Kane is a pleasant 66 year old male, he has a long history of multiple joint aches and pains, suspected seronegative rheumatoid arthritis. He also has known widespread osteoarthritic changes, as well as lumbar spondylitic changes. He is post several epidurals. He is been on multiple medications including NSAIDs, acetaminophen, steroids multiple times, gabapentin, Lyrica, tricyclics, he has either not been able to tolerate these or nothing has worked. He did have a left upper extremity nerve conduction/EMG that per his report showed carpal tunnel syndrome. His principal complaint is shooting pains both hands worse at night, as well as both feet. We are limited on medications that work. He does have a history of intolerance to statins, he was started on Zetia, took 1 day, and reported increasing pain. I did tell him that Zetia was an unlikely cause of myalgias. For now to get him out of pain we will do a course of prednisone. I do think he needs an updated upper and lower extremity nerve conduction/EMG, and I would like him to go ahead and discuss this with pain management. I did a one-time prescription of oxycodone at the last visit and I do suspect he will be 1 of those patients that needs longer-term chronic narcotics.

## 2022-01-06 DIAGNOSIS — M069 Rheumatoid arthritis, unspecified: Secondary | ICD-10-CM | POA: Diagnosis not present

## 2022-01-06 DIAGNOSIS — Z5181 Encounter for therapeutic drug level monitoring: Secondary | ICD-10-CM | POA: Diagnosis not present

## 2022-01-06 DIAGNOSIS — Z79899 Other long term (current) drug therapy: Secondary | ICD-10-CM | POA: Diagnosis not present

## 2022-02-17 ENCOUNTER — Ambulatory Visit: Payer: PPO | Admitting: Neurology

## 2022-02-17 DIAGNOSIS — G894 Chronic pain syndrome: Secondary | ICD-10-CM

## 2022-02-17 NOTE — Procedures (Signed)
[facility] Neurology  [place], [place]  [place], [place] [address] Tel: [phone] Fax: [phone] Test Date:  02/17/2022  Patient: Cody Kane DOB: 27-Apr-1955 Physician: Kai Levins, MD  Sex: Male Height: '5\' 10"'$  Ref Phys: Aundria Mems, MD  ID#: [id number]   Technician:    History: This is a 66 year old male with shooting pains in legs and hands.  NCV & EMG Findings: Extensive electrodiagnostic evaluation of the left upper and lower limbs with additional nerve conduction studies of the right upper limb show: Left sural, superficial peroneal/fibular, left median, left ulnar, left radial, and right median sensory responses are within normal limits. Left peroneal/fibular (EDB), left tibial (AH), left median (APB), left ulnar (ADM), and right median (APB) motor responses are within normal limits. There is no evidence of active or chronic motor axon loss changes affecting any of the tested muscles on needle examination. Motor unit configuration and recruitment pattern is within normal limits.  Impression: This is a normal electrodiagnostic evaluation. Specifically: No electrodiagnostic evidence of a large fiber sensorimotor neuropathy. No electrodiagnostic evidence of a left cervical (C5-C8) or left lumbosacral (L3-S1) motor radiculopathy. Screening studies for bilateral median neuropathy, including carpal tunnel syndrome, are normal. Screening studies for a left peroneal/fibular, left tibial, left ulnar, or left radial mononeuropathy are normal.    ___________________________ Kai Levins, MD    Nerve Conduction Studies Motor Nerve Results    Latency Amplitude F-Lat Segment Distance CV Comment  Site (ms) Norm (mV) Norm (ms)  (cm) (m/s) Norm   Left Fibular (EDB) Motor  Ankle 4.6  < 6.0 5.9  > 2.5        Bel fib head 12.5 - 4.6 -  Bel fib head-Ankle 35 44  > 40   Pop fossa 14.6 - 4.4 -  Pop fossa-Bel fib head 9 43 -   Left Median (APB) Motor  Wrist  3.0  < 4.0 8.8  > 5.0        Elbow 7.8 - 8.5 -  Elbow-Wrist 25.5 53  > 50   Right Median (APB) Motor  Wrist 3.0  < 4.0 9.6  > 5.0        Left Tibial (AH) Motor  Ankle 4.7  < 6.0 12.2  > 4.0        Knee 14.2 - 9.9 -  Knee-Ankle 42 44  > 40   Left Ulnar (ADM) Motor  Wrist 2.1  < 3.1 12.1  > 7.0        Bel elbow 5.8 - 11.1 -  Bel elbow-Wrist 22 59  > 50   Ab elbow 8.0 - 11.0 -  Ab elbow-Bel elbow 10 45 -    Sensory Sites    Neg Peak Lat Amplitude (O-P) Segment Distance Velocity Comment  Site (ms) Norm (V) Norm  (cm) (ms)   Left Median Sensory  Wrist-Dig II 3.2  < 3.8 33  > 10 Wrist-Dig II 13    Right Median Sensory  Wrist-Dig II 3.3  < 3.8 27  > 10 Wrist-Dig II 13    Left Radial Sensory  Forearm-Wrist 2.3  < 2.8 36  > 10 Forearm-Wrist 10    Left Superficial Fibular Sensory  14 cm-Ankle 3.0  < 4.6 9  > 3 14 cm-Ankle 14    Left Sural Sensory  Calf-Lat mall 4.1  < 4.6 14  > 3 Calf-Lat mall 14    Left Ulnar Sensory  Wrist-Dig V 3.1  < 3.2 6  >  5 Wrist-Dig V 11     H-Reflex Results    M-Lat H Lat H Neg Amp H-M Lat  Site (ms) (ms) Norm (mV) (ms)  Left Tibial H-Reflex  Pop fossa 5.5 *36.4  < 35.0 0.91 30.9   Electromyography   Side Muscle Ins.Act Fibs Fasc Recrt Amp Dur Poly Activation Comment  Left Tib ant Nml Nml Nml Nml Nml Nml Nml Nml N/A  Left Gastroc MH Nml Nml Nml Nml Nml Nml Nml Nml N/A  Left Rectus fem Nml Nml Nml Nml Nml Nml Nml Nml N/A  Left Biceps fem SH Nml Nml Nml Nml Nml Nml Nml Nml N/A  Left Gluteus med Nml Nml Nml Nml Nml Nml Nml Nml N/A  Left FDI Nml Nml Nml Nml Nml Nml Nml Nml N/A  Left EIP Nml Nml Nml Nml Nml Nml Nml Nml N/A  Left Pronator teres Nml Nml Nml Nml Nml Nml Nml Nml N/A  Left Biceps Nml Nml Nml Nml Nml Nml Nml Nml N/A  Left Triceps Nml Nml Nml Nml Nml Nml Nml Nml N/A  Left Deltoid Nml Nml Nml Nml Nml Nml Nml Nml N/A      Waveforms:  Motor             Sensory               H-Reflex

## 2022-03-10 DIAGNOSIS — M069 Rheumatoid arthritis, unspecified: Secondary | ICD-10-CM | POA: Diagnosis not present

## 2022-03-10 IMAGING — DX DG LUMBAR SPINE COMPLETE 4+V
5 series · 5 of 5 positions shown · non-contrast
Comparison: None.

CLINICAL DATA: Pain

EXAM:
LUMBAR SPINE - COMPLETE 4+ VIEW

[l-spine ap]
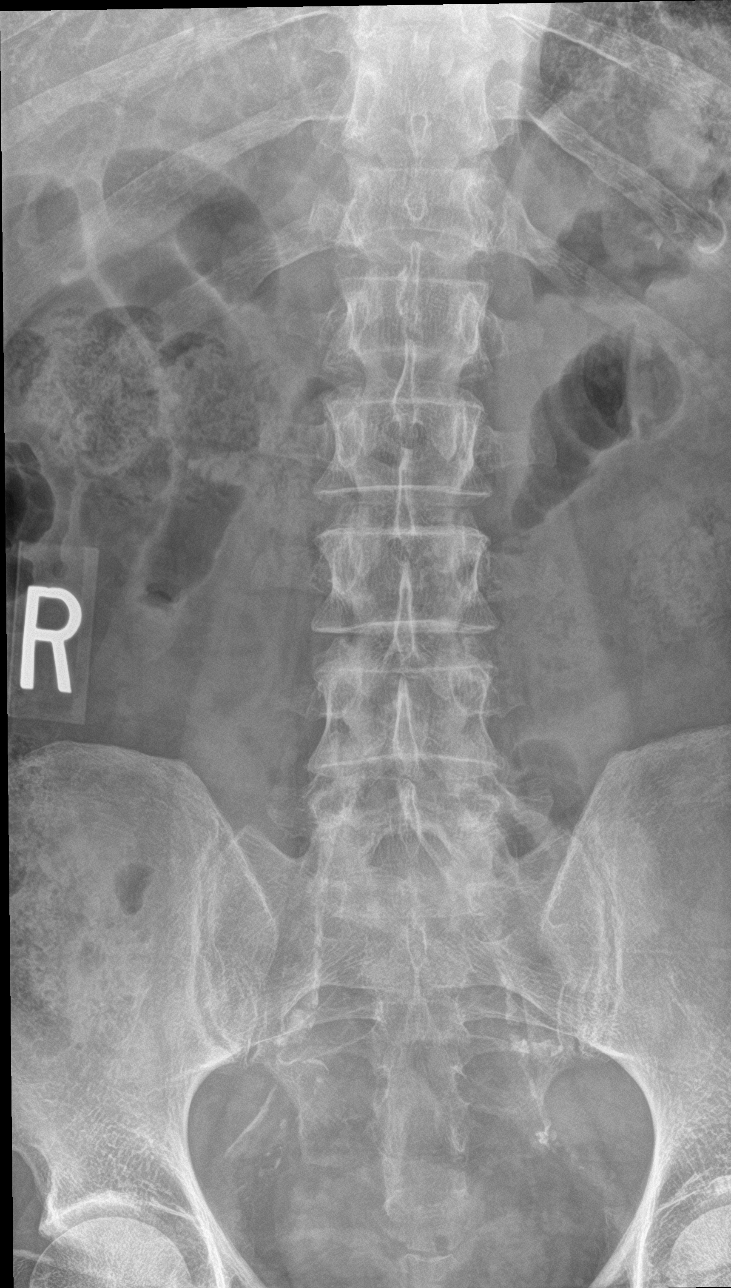

[l-spine obl (1 of 2)]
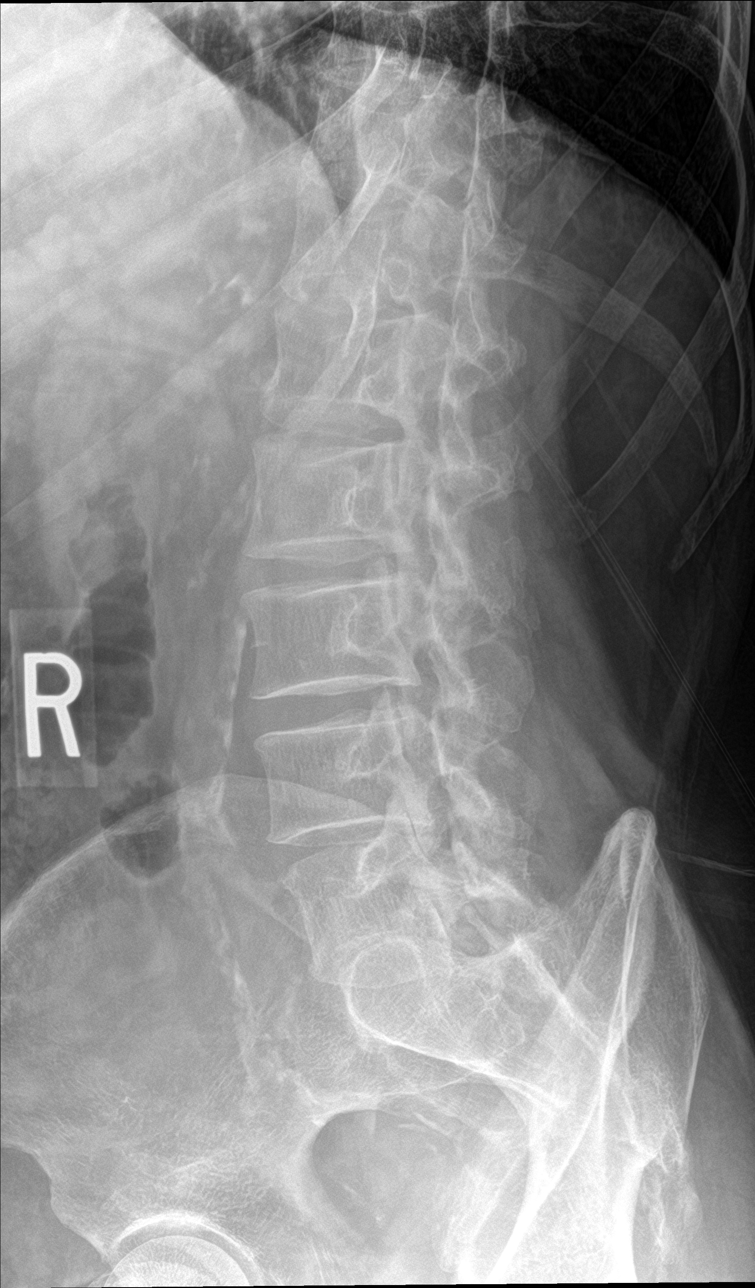

[l-spine obl (2 of 2)]
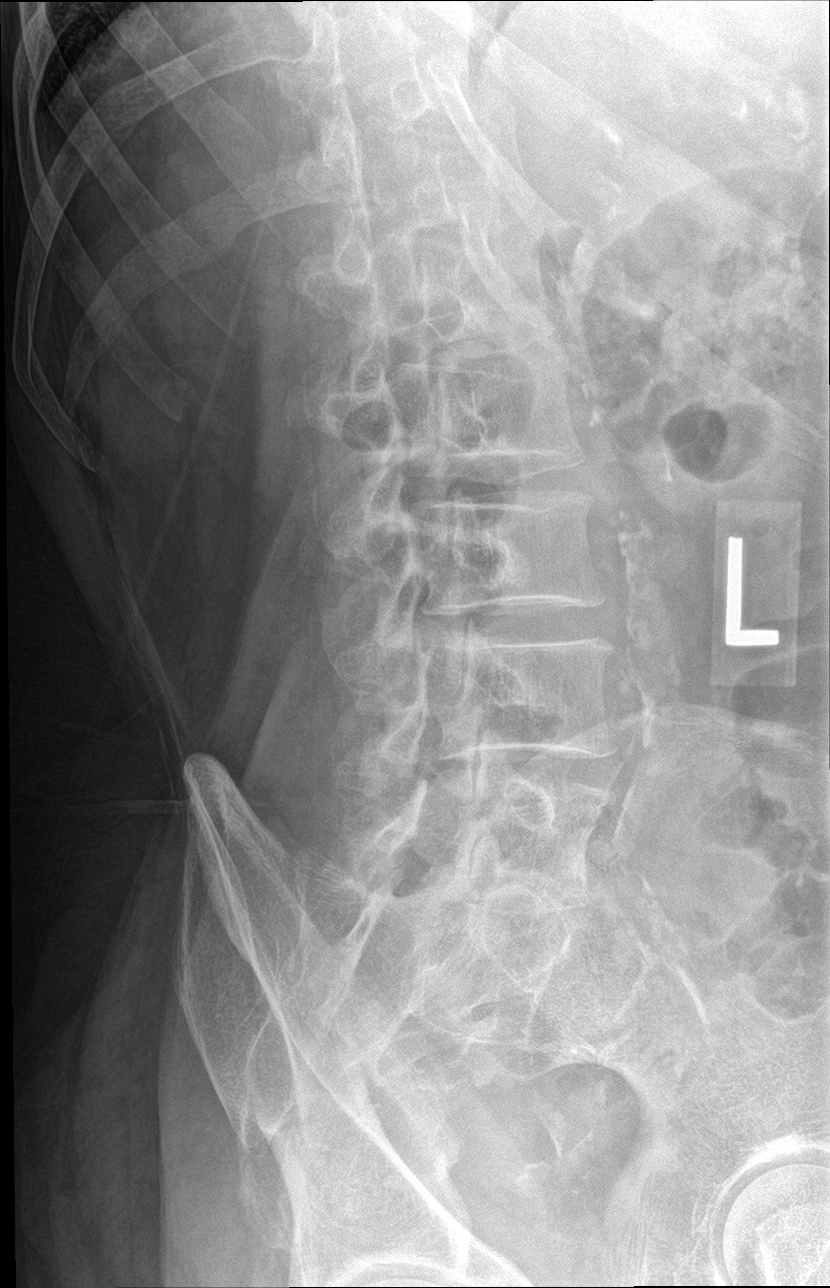

[l-spine lat]
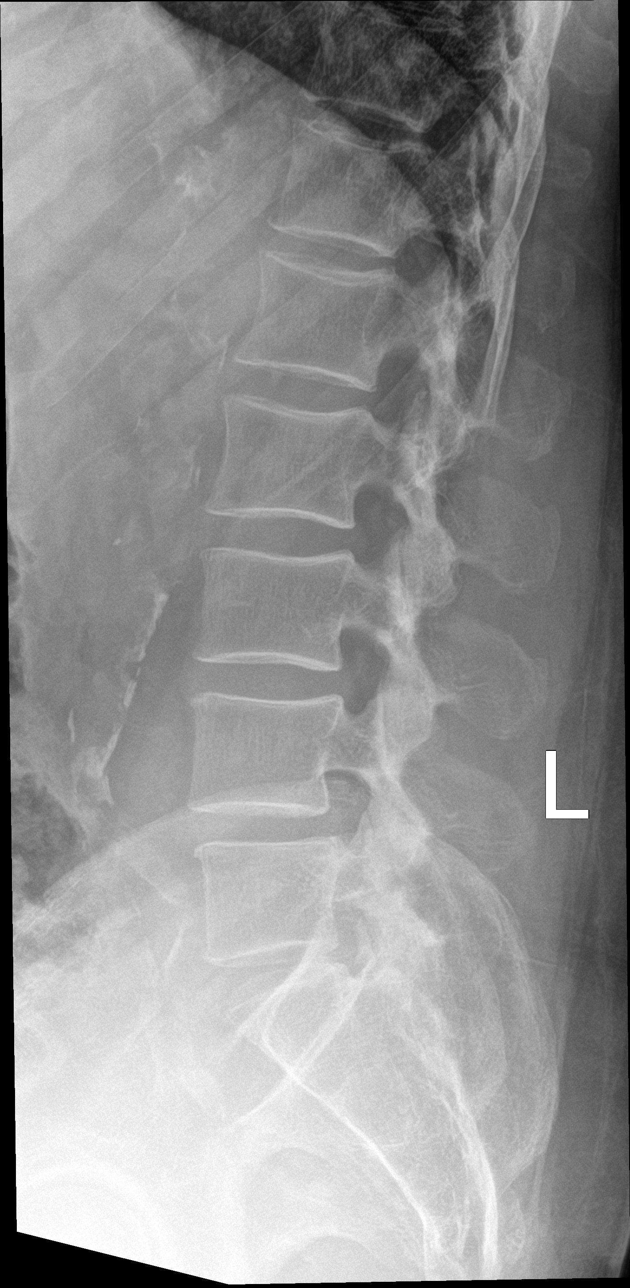

[l-spine spot]
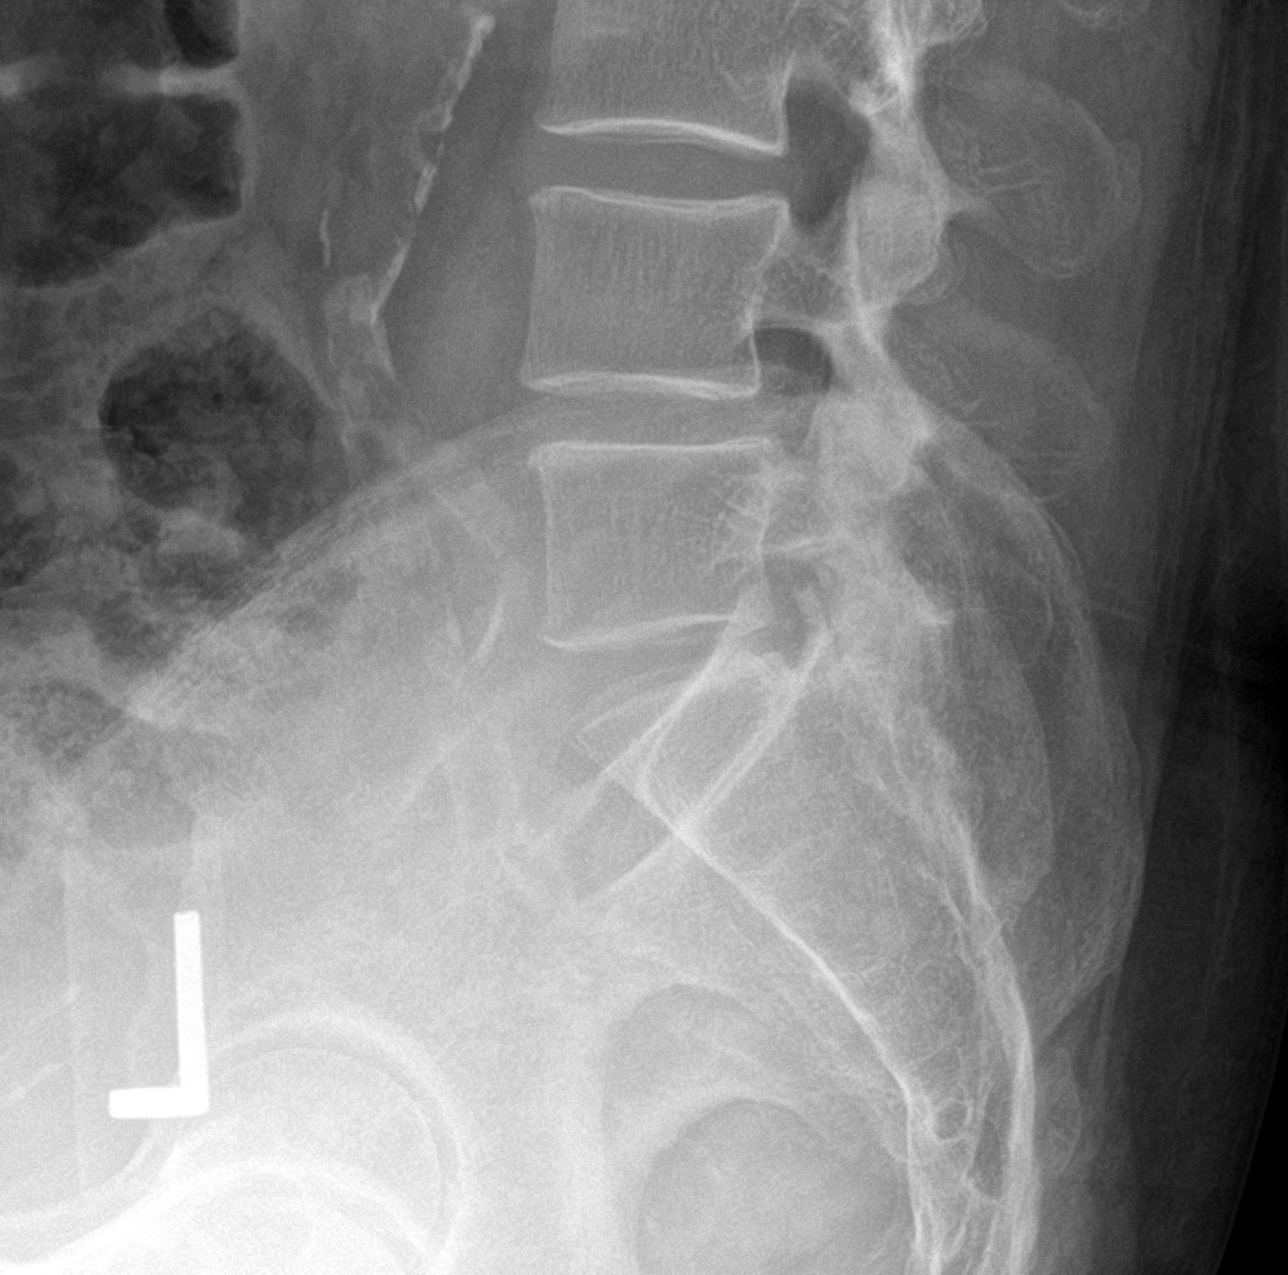

[5 of 5 positions shown; findings below may reference images not displayed]

FINDINGS: There are mild degenerative changes of the lumbar spine, greatest at
the L4-L5 level. There is a minimal 4 mm anterolisthesis of L4 on
L5, likely degenerative. There is no acute compression fracture. No
significant malalignment. There is facet arthrosis in the lower
lumbar segments. There are atherosclerotic changes of the abdominal
aorta.
IMPRESSION: Mild degenerative changes of the lumbar spine, greatest at the L4-L5
level. Minimal anterolisthesis of L4 on L5, likely degenerative.

## 2022-03-14 DIAGNOSIS — M19041 Primary osteoarthritis, right hand: Secondary | ICD-10-CM | POA: Diagnosis not present

## 2022-03-14 DIAGNOSIS — M19042 Primary osteoarthritis, left hand: Secondary | ICD-10-CM | POA: Diagnosis not present

## 2022-03-14 DIAGNOSIS — M1990 Unspecified osteoarthritis, unspecified site: Secondary | ICD-10-CM | POA: Diagnosis not present

## 2022-03-14 DIAGNOSIS — M180 Bilateral primary osteoarthritis of first carpometacarpal joints: Secondary | ICD-10-CM | POA: Diagnosis not present

## 2022-03-14 DIAGNOSIS — Z79899 Other long term (current) drug therapy: Secondary | ICD-10-CM | POA: Diagnosis not present

## 2022-03-14 DIAGNOSIS — Z7952 Long term (current) use of systemic steroids: Secondary | ICD-10-CM | POA: Diagnosis not present

## 2022-04-24 DIAGNOSIS — R3912 Poor urinary stream: Secondary | ICD-10-CM | POA: Diagnosis not present

## 2022-04-24 DIAGNOSIS — N401 Enlarged prostate with lower urinary tract symptoms: Secondary | ICD-10-CM | POA: Diagnosis not present

## 2022-05-20 ENCOUNTER — Other Ambulatory Visit: Payer: Self-pay | Admitting: Sports Medicine

## 2022-05-20 DIAGNOSIS — K219 Gastro-esophageal reflux disease without esophagitis: Secondary | ICD-10-CM

## 2022-06-09 DIAGNOSIS — M069 Rheumatoid arthritis, unspecified: Secondary | ICD-10-CM | POA: Diagnosis not present

## 2022-07-16 ENCOUNTER — Ambulatory Visit (INDEPENDENT_AMBULATORY_CARE_PROVIDER_SITE_OTHER): Payer: PPO | Admitting: Sports Medicine

## 2022-07-16 ENCOUNTER — Other Ambulatory Visit (INDEPENDENT_AMBULATORY_CARE_PROVIDER_SITE_OTHER): Payer: PPO

## 2022-07-16 DIAGNOSIS — M7542 Impingement syndrome of left shoulder: Secondary | ICD-10-CM

## 2022-07-16 NOTE — Progress Notes (Signed)
    Procedures performed today:    Procedure: Real-time Ultrasound Guided injection of the left subacromial bursa Device: Samsung HS60  Verbal informed consent obtained.  Time-out conducted.  Noted no overlying erythema, induration, or other signs of local infection.  Skin prepped in a sterile fashion.  Local anesthesia: Topical Ethyl chloride.  With sterile technique and under real time ultrasound guidance: Intact cuff noted, 1 cc Kenalog 40, 1 cc lidocaine, 1 cc bupivacaine injected easily Completed without difficulty  Advised to call if fevers/chills, erythema, induration, drainage, or persistent bleeding.  Images permanently stored and available for review in PACS.  Impression: Technically successful ultrasound guided injection.  Independent interpretation of notes and tests performed by another provider:   None.  Brief History, Exam, Impression, and Recommendations:    Impingement syndrome, shoulder, left Pleasant 67 year old male, increasing pain left shoulder localized over the deltoid worse with abduction. Approximately 8 months ago we did a acromioclavicular joint injection, there is no pain referable to the acromioclavicular joint today, he does have positive impingement signs and a positive empty can sign. Per his request we did a subacromial injection and he will do some formal therapy.    ____________________________________________ Ihor Austin. Benjamin Stain, M.D., ABFM., CAQSM., AME. Primary Care and Sports Medicine [facility]  Adjunct Professor of Family Medicine  [place] [employer]  Restaurant manager, fast food

## 2022-07-16 NOTE — Assessment & Plan Note (Signed)
Pleasant 67 year old male, increasing pain left shoulder localized over the deltoid worse with abduction. Approximately 8 months ago we did a acromioclavicular joint injection, there is no pain referable to the acromioclavicular joint today, he does have positive impingement signs and a positive empty can sign. Per his request we did a subacromial injection and he will do some formal therapy.

## 2022-07-22 ENCOUNTER — Encounter: Payer: Self-pay | Admitting: Sports Medicine

## 2022-08-13 DIAGNOSIS — M7542 Impingement syndrome of left shoulder: Secondary | ICD-10-CM | POA: Diagnosis not present

## 2022-08-18 ENCOUNTER — Ambulatory Visit (INDEPENDENT_AMBULATORY_CARE_PROVIDER_SITE_OTHER): Payer: PPO | Admitting: Sports Medicine

## 2022-08-18 DIAGNOSIS — Z0000 Encounter for general adult medical examination without abnormal findings: Secondary | ICD-10-CM

## 2022-08-18 NOTE — Progress Notes (Signed)
MEDICARE ANNUAL WELLNESS VISIT  08/18/2022  Telephone Visit Disclaimer This Medicare AWV was conducted by telephone due to national recommendations for restrictions regarding the COVID-19 Pandemic (e.g. social distancing).  I verified, using two identifiers, that I am speaking with Maryln Manuel or their authorized healthcare agent. I discussed the limitations, risks, security, and privacy concerns of performing an evaluation and management service by telephone and the potential availability of an in-person appointment in the future. The patient expressed understanding and agreed to proceed.  Location of Patient: Home Location of Provider (nurse):  In the office.  Subjective:    Avonte Dieleman is a 67 y.o. male patient of Thekkekandam, Ihor Austin, MD who had a Medicare Annual Wellness Visit today via telephone. Tildon is Retired and lives with their spouse. he has 1 child. he reports that he is socially active and does interact with friends/family regularly. he is moderately physically active and enjoys playing golf.  Patient Care Team: Monica Becton, MD as PCP - General (Family Medicine) Gabriel Carina, [facility] as Pharmacist (Pharmacist)     08/18/2022   11:44 AM 08/13/2021    3:56 PM 01/09/2020    3:12 PM 05/19/2014   12:11 PM 01/10/2014    7:08 AM 12/02/2013    7:23 AM 08/25/2013    8:51 AM  Advanced Directives  Does Patient Have a Medical Advance Directive? Yes Yes Yes No No No Patient does not have advance directive;Patient would not like information  Type of Advance Directive Living will Living will Healthcare Power of [place];Living will      Does patient want to make changes to medical advance directive? No - Patient declined No - Patient declined       Copy of Healthcare Power of Attorney in Chart?   No - copy requested      Would patient like information on creating a medical advance directive?    No - patient declined information No - patient declined information  No - patient declined information     Hospital Utilization Over the Past 12 Months: # of hospitalizations or ER visits: 0 # of surgeries: 0  Review of Systems    Patient reports that his overall health is unchanged compared to last year.  History obtained from chart review and the patient  Patient Reported Readings (BP, Pulse, CBG, Weight, etc) none  Pain Assessment Pain : No/denies pain     Current Medications & Allergies (verified) Allergies as of 08/18/2022       Reactions   Flomax [tamsulosin]    Statins Other (See Comments)   Myalgias and polyuria, with atorvastatin and Crestor   Finasteride Rash        Medication List        Accurate as of August 18, 2022 11:55 AM. If you have any questions, ask your nurse or doctor.          esomeprazole 40 MG capsule Commonly known as: NEXIUM TAKE 1 CAPSULE (40 MG TOTAL) BY MOUTH DAILY.   ezetimibe 10 MG tablet Commonly known as: ZETIA Take 1 tablet by mouth daily.   lidocaine 5 % Commonly known as: LIDODERM APPLY 1 PATCH TO SKIN ONCE DAILY (APPLY FOR 12 HOURS, THEN REMOVE FOR 12 HOURS) APPLY TO PAINFUL AREAS  JOINTS  NECK/BACK   nicotine 14 mg/24hr patch Commonly known as: NICODERM CQ - dosed in mg/24 hours   predniSONE 5 MG tablet Commonly known as: DELTASONE Take 1 tablet (5 mg total) by mouth daily  with breakfast.   predniSONE 50 MG tablet Commonly known as: DELTASONE One tab PO daily for 5 days.   rizatriptan 5 MG disintegrating tablet Commonly known as: Maxalt-MLT Take 1 tablet (5 mg total) by mouth as needed for migraine. May repeat in 2 hours if needed   valACYclovir 1000 MG tablet Commonly known as: VALTREX Take 1 tablet (1,000 mg total) by mouth 2 (two) times daily as needed.        History (reviewed): Past Medical History:  Diagnosis Date   Arthritis    BPH (benign prostatic hypertrophy)    Elevated PSA    GERD (gastroesophageal reflux disease)    Lower urinary tract symptoms (LUTS)     Mild obstructive sleep apnea    pt just had study done--  to start using cpap next week   Past Surgical History:  Procedure Laterality Date   COLONOSCOPY  2011   CYSTOSCOPY N/A 12/02/2013   Procedure: CYSTOSCOPY;  Surgeon: Jerilee Field, MD;  Location: [facility];  Service: Urology;  Laterality: N/A;   GREEN LIGHT LASER TURP (TRANSURETHRAL RESECTION OF PROSTATE N/A 01/10/2014   Procedure: GREEN LIGHT LASER TURP (TRANSURETHRAL RESECTION OF PROSTATE;  Surgeon: Jerilee Field, MD;  Location: [facility];  Service: Urology;  Laterality: N/A;   PROSTATE BIOPSY N/A 12/02/2013   Procedure: BIOPSY TRANSRECTAL ULTRASONIC PROSTATE (TUBP);  Surgeon: Jerilee Field, MD;  Location: [facility];  Service: Urology;  Laterality: N/A;   Family History  Problem Relation Age of Onset   Cancer Mother        colon   Cancer Father        lung   Social History   Socioeconomic History   Marital status: Married    Spouse name: Loel Dubonnet   Number of children: 1   Years of education: [employer] education level: Some college, no degree  Occupational History    Comment: Administration    Occupation: Retired  Tobacco Use   Smoking status: Every Day    Packs/day: 1.00    Years: 26.00    Additional pack years: 0.00    Total pack years: 26.00    Types: Cigarettes   Smokeless tobacco: Never  Vaping Use   Vaping Use: Some days  Substance and Sexual Activity   Alcohol use: Yes    Alcohol/week: 0.0 standard drinks of alcohol    Comment: occasional   Drug use: No   Sexual activity: Not on file  Other Topics Concern   Not on file  Social History Narrative   Lives with his spouse. Enjoys playing golf.   Social Determinants of Health   Financial Resource Strain: Low Risk  (08/18/2022)   Overall Financial Resource Strain (CARDIA)    Difficulty of Paying Living Expenses: Not hard at all  Food Insecurity: No Food Insecurity (08/18/2022)   Hunger  Vital Sign    Worried About Running Out of Food in the Last Year: Never true    Ran Out of Food in the Last Year: Never true  Transportation Needs: No Transportation Needs (08/18/2022)   PRAPARE - Administrator, Civil Service (Medical): No    Lack of Transportation (Non-Medical): No  Physical Activity: Sufficiently Active (08/18/2022)   Exercise Vital Sign    Days of Exercise per Week: 5 days    Minutes of Exercise per Session: 150+ min  Stress: No Stress Concern Present (08/18/2022)   [employer] of Occupational Health - Occupational Stress  Questionnaire    Feeling of Stress : Not at all  Social Connections: Moderately Isolated (08/18/2022)   Social Connection and Isolation Panel [NHANES]    Frequency of Communication with Friends and Family: Once a week    Frequency of Social Gatherings with Friends and Family: More than three times a week    Attends Religious Services: Never    Database administrator or Organizations: No    Attends Banker Meetings: Never    Marital Status: Married    Activities of Daily Living    08/18/2022   11:46 AM  In your present state of health, do you have any difficulty performing the following activities:  Hearing? 0  Vision? 0  Difficulty concentrating or making decisions? 0  Comment some memory loss  Walking or climbing stairs? 0  Dressing or bathing? 0  Doing errands, shopping? 0  Preparing Food and eating ? N  Using the Toilet? N  In the past six months, have you accidently leaked urine? Y  Comment intermittent  Do you have problems with loss of bowel control? N  Managing your Medications? N  Managing your Finances? N  Housekeeping or managing your Housekeeping? N    Patient Education/ Literacy How often do you need to have someone help you when you read instructions, pamphlets, or other written materials from your doctor or pharmacy?: 1 - Never What is the last grade level you completed in school?: Some  college  Exercise Current Exercise Habits: Home exercise routine, Type of exercise: Other - see comments (golf), Time (Minutes): > 60, Frequency (Times/Week): 5, Weekly Exercise (Minutes/Week): 0, Intensity: Moderate, Exercise limited by: None identified  Diet Patient reports consuming 3 meals a day and 0 snack(s) a day Patient reports that his primary diet is: Regular Patient reports that she does have regular access to food.   Depression Screen    08/18/2022   11:44 AM 08/13/2021    3:56 PM 06/18/2021    3:33 PM 10/19/2020    4:11 PM 02/07/2020    9:14 AM 10/18/2019   10:14 AM 01/08/2018    9:36 AM  PHQ 2/9 Scores  PHQ - 2 Score 0 0 0 2 0 2 5  PHQ- 9 Score  0 0 5 0  22  Exception Documentation     Other- indicate reason in comment box    Not completed     Depression in complete remission, mood good       Fall Risk    08/18/2022   11:44 AM 08/13/2021    3:56 PM 06/18/2021    3:33 PM 02/07/2020    9:14 AM 10/18/2019   10:20 AM  Fall Risk   Falls in the past year? 0 1 0 0 0  Number falls in past yr: 0 1 0 0   Injury with Fall? 0 0 0 0   Risk for fall due to : No Fall Risks History of fall(s)     Follow up Falls evaluation completed Falls evaluation completed        Objective:  Larence Rabe seemed alert and oriented and he participated appropriately during our telephone visit.  Blood Pressure Weight BMI  BP Readings from Last 3 Encounters:  08/13/21 118/60  01/11/21 123/71  10/19/20 (!) 107/54   Wt Readings from Last 3 Encounters:  12/23/21 177 lb (80.3 kg)  08/13/21 177 lb 1.9 oz (80.3 kg)  01/11/21 177 lb (80.3 kg)   BMI Readings from Last 1  Encounters:  12/23/21 25.40 kg/m    *Unable to obtain current vital signs, weight, and BMI due to telephone visit type  Hearing/Vision  Daphine Deutscher did not seem to have difficulty with hearing/understanding during the telephone conversation Reports that he has had a formal eye exam by an eye care professional within the past  year Reports that he has not had a formal hearing evaluation within the past year *Unable to fully assess hearing and vision during telephone visit type  Cognitive Function:    08/18/2022   11:48 AM 08/13/2021    4:01 PM  6CIT Screen  What Year? 0 points 0 points  What month? 0 points 0 points  What time? 0 points 0 points  Count back from 20 0 points 0 points  Months in reverse 0 points 0 points  Repeat phrase 2 points 0 points  Total Score 2 points 0 points   (Normal:0-7, Significant for Dysfunction: >8)  Normal Cognitive Function Screening: Yes   Immunization & Health Maintenance Record Immunization History  Administered Date(s) Administered   Fluad Quad(high Dose 65+) 11/30/2020   Influenza Split 10/16/2017   Influenza,inj,Quad PF,6+ Mos 11/29/2014, 12/03/2015, 12/23/2016, 10/16/2017, 11/30/2018, 03/29/2020   Moderna Sars-Covid-2 Vaccination 04/20/2019, 05/19/2019, 12/08/2019   Pneumococcal Conjugate-13 01/17/2019, 10/18/2019   Tdap 05/19/2014, 08/31/2017   Zoster Recombinat (Shingrix) 11/30/2020, 11/28/2021   Zoster, Live 06/29/2015    Health Maintenance  Topic Date Due   COVID-19 Vaccine (4 - 2023-24 season) 09/03/2022 (Originally 11/01/2021)   Lung Cancer Screening  08/18/2023 (Originally 07/21/2013)   Pneumonia Vaccine 73+ Years old (2 of 2 - PPSV23 or PCV20) 08/18/2023 (Originally 12/13/2019)   INFLUENZA VACCINE  10/02/2022   Medicare Annual Wellness (AWV)  08/18/2023   Colonoscopy  07/22/2026   DTaP/Tdap/Td (3 - Td or Tdap) 09/01/2027   Hepatitis C Screening  Completed   Zoster Vaccines- Shingrix  Completed   HPV VACCINES  Aged Out       Assessment  This is a routine wellness examination for [employer].  Health Maintenance: Due or Overdue There are no preventive care reminders to display for this patient.   Rockwell Zeldin does not need a referral for Community Assistance: Care Management:   no Social Work:    no Prescription  Assistance:  no Nutrition/Diabetes Education:  no   Plan:  Personalized Goals  Goals Addressed               This Visit's Progress     Patient Stated (pt-stated)        Patient stated that he would like to stay healthy.       Personalized Health Maintenance & Screening Recommendations  [facility]  Lung Cancer Screening Recommended: yes; completed in May at the [facility] (Low Dose CT Chest recommended if Age 49-80 years, 20 pack-year currently smoking OR have quit w/in past 15 years) Hepatitis C Screening recommended: no HIV Screening recommended: no  Advanced Directives: Written information was not prepared per patient's request.  Referrals & Orders No orders of the defined types were placed in this encounter.   Follow-up Plan Follow-up with Monica Becton, MD as planned Medicare wellness visit in one year.  Patient will access AVS on my chart.   I have personally reviewed and noted the following in the patient's chart:   Medical and social history Use of alcohol, tobacco or illicit drugs  Current medications and supplements Functional ability and status Nutritional status Physical activity Advanced directives  List of other physicians Hospitalizations, surgeries, and ER visits in previous 12 months Vitals Screenings to include cognitive, depression, and falls Referrals and appointments  In addition, I have reviewed and discussed with Maryln Manuel certain preventive protocols, quality metrics, and best practice recommendations. A written personalized care plan for preventive services as well as general preventive health recommendations is available and can be mailed to the patient at his request.      Modesto Charon, RN BSN  08/18/2022

## 2022-08-18 NOTE — Patient Instructions (Signed)
MEDICARE ANNUAL WELLNESS VISIT Health Maintenance Summary and Written Plan of Care  Mr. Cody Kane ,  Thank you for allowing me to perform your Medicare Annual Wellness Visit and for your ongoing commitment to your health.   Health Maintenance & Immunization History Health Maintenance  Topic Date Due   COVID-19 Vaccine (4 - 2023-24 season) 09/03/2022 (Originally 11/01/2021)   Lung Cancer Screening  08/18/2023 (Originally 07/21/2013)   Pneumonia Vaccine 65+ Years old (2 of 2 - PPSV23 or PCV20) 08/18/2023 (Originally 12/13/2019)   INFLUENZA VACCINE  10/02/2022   Medicare Annual Wellness (AWV)  08/18/2023   Colonoscopy  07/22/2026   DTaP/Tdap/Td (3 - Td or Tdap) 09/01/2027   Hepatitis C Screening  Completed   Zoster Vaccines- Shingrix  Completed   HPV VACCINES  Aged Out   Immunization History  Administered Date(s) Administered   Fluad Quad(high Dose 65+) 11/30/2020   Influenza Split 10/16/2017   Influenza,inj,Quad PF,6+ Mos 11/29/2014, 12/03/2015, 12/23/2016, 10/16/2017, 11/30/2018, 03/29/2020   Moderna Sars-Covid-2 Vaccination 04/20/2019, 05/19/2019, 12/08/2019   Pneumococcal Conjugate-13 01/17/2019, 10/18/2019   Tdap 05/19/2014, 08/31/2017   Zoster Recombinat (Shingrix) 11/30/2020, 11/28/2021   Zoster, Live 06/29/2015    These are the patient goals that we discussed:  Goals Addressed               This Visit's Progress     Patient Stated (pt-stated)        Patient stated that he would like to stay healthy.         This is a list of Health Maintenance Items that are overdue or due now: There are no preventive care reminders to display for this patient.    Orders/Referrals Placed Today: No orders of the defined types were placed in this encounter.  (Contact our referral department at [phone] if you have not spoken with someone about your referral appointment within the next 5 days)    Follow-up Plan Follow-up with Monica Becton, MD as  planned Medicare wellness visit in one year.  Patient will access AVS on my chart.      Health Maintenance, Male Adopting a healthy lifestyle and getting preventive care are important in promoting health and wellness. Ask your health care provider about: The right schedule for you to have regular tests and exams. Things you can do on your own to prevent diseases and keep yourself healthy. What should I know about diet, weight, and exercise? Eat a healthy diet  Eat a diet that includes plenty of vegetables, fruits, low-fat dairy products, and lean protein. Do not eat a lot of foods that are high in solid fats, added sugars, or sodium. Maintain a healthy weight Body mass index (BMI) is a measurement that can be used to identify possible weight problems. It estimates body fat based on height and weight. Your health care provider can help determine your BMI and help you achieve or maintain a healthy weight. Get regular exercise Get regular exercise. This is one of the most important things you can do for your health. Most adults should: Exercise for at least 150 minutes each week. The exercise should increase your heart rate and make you sweat (moderate-intensity exercise). Do strengthening exercises at least twice a week. This is in addition to the moderate-intensity exercise. Spend less time sitting. Even light physical activity can be beneficial. Watch cholesterol and blood lipids Have your blood tested for lipids and cholesterol at 68 years of age, then have this test every 5 years. You may need to  have your cholesterol levels checked more often if: Your lipid or cholesterol levels are high. You are older than 68 years of age. You are at high risk for heart disease. What should I know about cancer screening? Many types of cancers can be detected early and may often be prevented. Depending on your health history and family history, you may need to have cancer screening at various ages.  This may include screening for: Colorectal cancer. Prostate cancer. Skin cancer. Lung cancer. What should I know about heart disease, diabetes, and high blood pressure? Blood pressure and heart disease High blood pressure causes heart disease and increases the risk of stroke. This is more likely to develop in people who have high blood pressure readings or are overweight. Talk with your health care provider about your target blood pressure readings. Have your blood pressure checked: Every 3-5 years if you are 79-7 years of age. Every year if you are 69 years old or older. If you are between the ages of 16 and 49 and are a current or former smoker, ask your health care provider if you should have a one-time screening for abdominal aortic aneurysm (AAA). Diabetes Have regular diabetes screenings. This checks your fasting blood sugar level. Have the screening done: Once every three years after age 46 if you are at a normal weight and have a low risk for diabetes. More often and at a younger age if you are overweight or have a high risk for diabetes. What should I know about preventing infection? Hepatitis B If you have a higher risk for hepatitis B, you should be screened for this virus. Talk with your health care provider to find out if you are at risk for hepatitis B infection. Hepatitis C Blood testing is recommended for: Everyone born from [birth year] through 1966. Anyone with known risk factors for hepatitis C. Sexually transmitted infections (STIs) You should be screened each year for STIs, including gonorrhea and chlamydia, if: You are sexually active and are younger than 68 years of age. You are older than 68 years of age and your health care provider tells you that you are at risk for this type of infection. Your sexual activity has changed since you were last screened, and you are at increased risk for chlamydia or gonorrhea. Ask your health care provider if you are at risk. Ask your  health care provider about whether you are at high risk for HIV. Your health care provider may recommend a prescription medicine to help prevent HIV infection. If you choose to take medicine to prevent HIV, you should first get tested for HIV. You should then be tested every 3 months for as long as you are taking the medicine. Follow these instructions at home: Alcohol use Do not drink alcohol if your health care provider tells you not to drink. If you drink alcohol: Limit how much you have to 0-2 drinks a day. Know how much alcohol is in your drink. In the [place] one drink equals one 12 oz bottle of beer (355 mL), one 5 oz glass of wine (148 mL), or one 1 oz glass of hard liquor (44 mL). Lifestyle Do not use any products that contain nicotine or tobacco. These products include cigarettes, chewing tobacco, and vaping devices, such as e-cigarettes. If you need help quitting, ask your health care provider. Do not use street drugs. Do not share needles. Ask your health care provider for help if you need support or information about quitting drugs. General instructions  Schedule regular health, dental, and eye exams. Stay current with your vaccines. Tell your health care provider if: You often feel depressed. You have ever been abused or do not feel safe at home. Summary Adopting a healthy lifestyle and getting preventive care are important in promoting health and wellness. Follow your health care provider's instructions about healthy diet, exercising, and getting tested or screened for diseases. Follow your health care provider's instructions on monitoring your cholesterol and blood pressure. This information is not intended to replace advice given to you by your health care provider. Make sure you discuss any questions you have with your health care provider. Document Revised: 07/09/2020 Document Reviewed: 07/09/2020 Elsevier Patient Education  2024 [employer].

## 2022-08-19 ENCOUNTER — Other Ambulatory Visit: Payer: Self-pay | Admitting: Sports Medicine

## 2022-08-19 DIAGNOSIS — K219 Gastro-esophageal reflux disease without esophagitis: Secondary | ICD-10-CM

## 2022-08-25 DIAGNOSIS — H26492 Other secondary cataract, left eye: Secondary | ICD-10-CM | POA: Diagnosis not present

## 2022-08-25 DIAGNOSIS — Z961 Presence of intraocular lens: Secondary | ICD-10-CM | POA: Diagnosis not present

## 2022-08-27 ENCOUNTER — Ambulatory Visit (INDEPENDENT_AMBULATORY_CARE_PROVIDER_SITE_OTHER): Payer: PPO | Admitting: Sports Medicine

## 2022-08-27 ENCOUNTER — Encounter: Payer: Self-pay | Admitting: Sports Medicine

## 2022-08-27 VITALS — BP 138/72 | HR 62 | Ht 70.0 in | Wt 174.0 lb

## 2022-08-27 DIAGNOSIS — H1132 Conjunctival hemorrhage, left eye: Secondary | ICD-10-CM

## 2022-08-27 DIAGNOSIS — M06042 Rheumatoid arthritis without rheumatoid factor, left hand: Secondary | ICD-10-CM

## 2022-08-27 DIAGNOSIS — E7800 Pure hypercholesterolemia, unspecified: Secondary | ICD-10-CM | POA: Diagnosis not present

## 2022-08-27 DIAGNOSIS — Z13820 Encounter for screening for osteoporosis: Secondary | ICD-10-CM

## 2022-08-27 DIAGNOSIS — R972 Elevated prostate specific antigen [PSA]: Secondary | ICD-10-CM

## 2022-08-27 DIAGNOSIS — M06041 Rheumatoid arthritis without rheumatoid factor, right hand: Secondary | ICD-10-CM

## 2022-08-27 DIAGNOSIS — G894 Chronic pain syndrome: Secondary | ICD-10-CM | POA: Diagnosis not present

## 2022-08-27 DIAGNOSIS — Z0000 Encounter for general adult medical examination without abnormal findings: Secondary | ICD-10-CM | POA: Diagnosis not present

## 2022-08-27 DIAGNOSIS — R7303 Prediabetes: Secondary | ICD-10-CM | POA: Diagnosis not present

## 2022-08-27 LAB — CBC: MCHC: 32.9 g/dL (ref 32.0–36.0)

## 2022-08-27 MED ORDER — PREDNISONE 5 MG PO TABS: 5.0000 mg | ORAL_TABLET | Freq: Every day | ORAL | 3 refills | Status: DC

## 2022-08-27 NOTE — Assessment & Plan Note (Signed)
Cody Kane returns, he is here for his physical, prefers vaccines at the [place]. He is up-to-date on screening measures, he is on chronic prednisone therapy so we will add a bone density test and A1c.

## 2022-08-27 NOTE — Progress Notes (Signed)
Subjective:    CC: Annual Physical Exam  HPI:  This patient is here for their annual physical  I reviewed the past medical history, family history, social history, surgical history, and allergies today and no changes were needed.  Please see the problem list section below in epic for further details.  Past Medical History: Past Medical History:  Diagnosis Date   Arthritis    BPH (benign prostatic hypertrophy)    Elevated PSA    GERD (gastroesophageal reflux disease)    Lower urinary tract symptoms (LUTS)    Mild obstructive sleep apnea    pt just had study done--  to start using cpap next week   Past Surgical History: Past Surgical History:  Procedure Laterality Date   COLONOSCOPY  2011   CYSTOSCOPY N/A 12/02/2013   Procedure: CYSTOSCOPY;  Surgeon: Jerilee Field, MD;  Location: [facility];  Service: Urology;  Laterality: N/A;   GREEN LIGHT LASER TURP (TRANSURETHRAL RESECTION OF PROSTATE N/A 01/10/2014   Procedure: GREEN LIGHT LASER TURP (TRANSURETHRAL RESECTION OF PROSTATE;  Surgeon: Jerilee Field, MD;  Location: [facility];  Service: Urology;  Laterality: N/A;   PROSTATE BIOPSY N/A 12/02/2013   Procedure: BIOPSY TRANSRECTAL ULTRASONIC PROSTATE (TUBP);  Surgeon: Jerilee Field, MD;  Location: [place];  Service: Urology;  Laterality: N/A;   Social History: Social History   Socioeconomic History   Marital status: Married    Spouse name: Cody Kane   Number of children: 1   Years of education: [employer] education level: Some college, no degree  Occupational History    Comment: Administration    Occupation: Retired  Tobacco Use   Smoking status: Every Day    Packs/day: 1.00    Years: 26.00    Additional pack years: 0.00    Total pack years: 26.00    Types: Cigarettes   Smokeless tobacco: Never  Vaping Use   Vaping Use: Some days  Substance and Sexual Activity   Alcohol use: Yes    Alcohol/week: 0.0  standard drinks of alcohol    Comment: occasional   Drug use: No   Sexual activity: Not on file  Other Topics Concern   Not on file  Social History Narrative   Lives with his spouse. Enjoys playing golf.   Social Determinants of Health   Financial Resource Strain: Low Risk  (08/18/2022)   Overall Financial Resource Strain (CARDIA)    Difficulty of Paying Living Expenses: Not hard at all  Food Insecurity: No Food Insecurity (08/18/2022)   Hunger Vital Sign    Worried About Running Out of Food in the Last Year: Never true    Ran Out of Food in the Last Year: Never true  Transportation Needs: No Transportation Needs (08/18/2022)   PRAPARE - Administrator, Civil Service (Medical): No    Lack of Transportation (Non-Medical): No  Physical Activity: Sufficiently Active (08/18/2022)   Exercise Vital Sign    Days of Exercise per Week: 5 days    Minutes of Exercise per Session: 150+ min  Stress: No Stress Concern Present (08/18/2022)   [employer] of Occupational Health - Occupational Stress Questionnaire    Feeling of Stress : Not at all  Social Connections: Moderately Isolated (08/18/2022)   Social Connection and Isolation Panel [NHANES]    Frequency of Communication with Friends and Family: Once a week    Frequency of Social Gatherings with Friends and Family: More than three times a week  Attends Religious Services: Never    Active Member of Clubs or Organizations: No    Attends Banker Meetings: Never    Marital Status: Married   Family History: Family History  Problem Relation Age of Onset   Cancer Mother        colon   Cancer Father        lung   Allergies: Allergies  Allergen Reactions   Flomax [Tamsulosin]    Statins Other (See Comments)    Myalgias and polyuria, with atorvastatin and Crestor   Finasteride Rash   Medications: See med rec.  Review of Systems: No headache, visual changes, nausea, vomiting, diarrhea, constipation,  dizziness, abdominal pain, skin rash, fevers, chills, night sweats, swollen lymph nodes, weight loss, chest pain, body aches, joint swelling, muscle aches, shortness of breath, mood changes, visual or auditory hallucinations.  Objective:    General: Well Developed, well nourished, and in no acute distress.  Neuro: Alert and oriented x3, extra-ocular muscles intact, sensation grossly intact. Cranial nerves II through XII are intact, motor, sensory, and coordinative functions are all intact. HEENT: Normocephalic, atraumatic, pupils equal round reactive to light, neck supple, no masses, no lymphadenopathy, thyroid nonpalpable. Oropharynx, nasopharynx, external ear canals are unremarkable. Skin: Warm and dry, no rashes noted.  Cardiac: Regular rate and rhythm, no murmurs rubs or gallops.  Respiratory: Clear to auscultation bilaterally. Not using accessory muscles, speaking in full sentences.  Abdominal: Soft, nontender, nondistended, positive bowel sounds, no masses, no organomegaly.  Musculoskeletal: Shoulder, elbow, wrist, hip, knee, ankle stable, and with full range of motion.  Impression and Recommendations:    The patient was counselled, risk factors were discussed, anticipatory guidance given.  Subconjunctival hemorrhage of left eye Has occurred twice now without,, he does have an optometrist involved. We will be checking his platelet count as well as coags  Annual physical exam Cody Kane returns, he is here for his physical, prefers vaccines at the [place]. He is up-to-date on screening measures, he is on chronic prednisone therapy so we will add a bone density test and A1c.   ____________________________________________ Ihor Austin. Benjamin Stain, M.D., ABFM., CAQSM., AME. Primary Care and Sports Medicine [facility]  Adjunct Professor of Family Medicine  [place] of [facility] of Medicine  Restaurant manager, fast food

## 2022-08-27 NOTE — Assessment & Plan Note (Signed)
Has occurred twice now without,, he does have an optometrist involved. We will be checking his platelet count as well as coags

## 2022-08-28 ENCOUNTER — Telehealth: Payer: Self-pay | Admitting: Sports Medicine

## 2022-08-28 ENCOUNTER — Encounter: Payer: Self-pay | Admitting: Sports Medicine

## 2022-08-28 LAB — CBC
HCT: 45.3 % (ref 38.5–50.0)
MPV: 8.5 fL (ref 7.5–12.5)

## 2022-08-28 LAB — PROTIME-INR: Prothrombin Time: 10.1 s (ref 9.0–11.5)

## 2022-08-28 LAB — COMPREHENSIVE METABOLIC PANEL
AST: 15 U/L (ref 10–35)
CO2: 28 mmol/L (ref 20–32)
Calcium: 10 mg/dL (ref 8.6–10.3)
Globulin: 2.9 g/dL (calc) (ref 1.9–3.7)
Potassium: 4.3 mmol/L (ref 3.5–5.3)

## 2022-08-28 LAB — LIPID PANEL
HDL: 56 mg/dL (ref >=40)
Total CHOL/HDL Ratio: 4.5 (calc) (ref <5.0)

## 2022-08-28 NOTE — Telephone Encounter (Signed)
[facility] Called they need a signature on plan of care please fax back to [phone]

## 2022-08-29 LAB — APTT: aPTT: 30 s (ref 23–32)

## 2022-08-29 LAB — CBC
Hemoglobin: 14.9 g/dL (ref 13.2–17.1)
MCH: 30 pg (ref 27.0–33.0)
MCV: 91.1 fL (ref 80.0–100.0)
Platelets: 207 Thousand/uL (ref 140–400)
RBC: 4.97 Million/uL (ref 4.20–5.80)
RDW: 12.9 % (ref 11.0–15.0)
WBC: 7.7 10*3/uL (ref 3.8–10.8)

## 2022-08-29 LAB — COMPREHENSIVE METABOLIC PANEL WITH GFR
AG Ratio: 1.5 (calc) (ref 1.0–2.5)
Albumin: 4.4 g/dL (ref 3.6–5.1)
Chloride: 105 mmol/L (ref 98–110)
Creat: 1.04 mg/dL (ref 0.70–1.35)
Sodium: 140 mmol/L (ref 135–146)
Total Bilirubin: 0.4 mg/dL (ref 0.2–1.2)
Total Protein: 7.3 g/dL (ref 6.1–8.1)

## 2022-08-29 LAB — LIPID PANEL
Cholesterol: 253 mg/dL — ABNORMAL HIGH (ref <200)
LDL Cholesterol (Calc): 165 mg/dL — ABNORMAL HIGH
Non-HDL Cholesterol (Calc): 197 mg/dL (calc) — ABNORMAL HIGH (ref <130)
Triglycerides: 175 mg/dL — ABNORMAL HIGH (ref <150)

## 2022-08-29 LAB — PSA, TOTAL AND FREE
PSA, % Free: 26 % (calc) (ref >25)
PSA, Free: 1.6 ng/mL
PSA, Total: 6.1 ng/mL — ABNORMAL HIGH (ref <=4.0)

## 2022-08-29 LAB — PROTIME-INR: INR: 0.9

## 2022-08-29 LAB — COMPREHENSIVE METABOLIC PANEL
ALT: 14 U/L (ref 9–46)
Alkaline phosphatase (APISO): 77 U/L (ref 35–144)
BUN: 19 mg/dL (ref 7–25)
Glucose, Bld: 85 mg/dL (ref 65–99)

## 2022-08-29 LAB — HEMOGLOBIN A1C
Hgb A1c MFr Bld: 5.7 %{Hb} — ABNORMAL HIGH (ref <5.7)
Mean Plasma Glucose: 117 mg/dL
eAG (mmol/L): 6.5 mmol/L

## 2022-08-29 LAB — TSH: TSH: 0.54 mIU/L (ref 0.40–4.50)

## 2022-09-08 DIAGNOSIS — M069 Rheumatoid arthritis, unspecified: Secondary | ICD-10-CM | POA: Diagnosis not present

## 2022-09-08 DIAGNOSIS — Z5181 Encounter for therapeutic drug level monitoring: Secondary | ICD-10-CM | POA: Diagnosis not present

## 2022-09-08 DIAGNOSIS — Z79899 Other long term (current) drug therapy: Secondary | ICD-10-CM | POA: Diagnosis not present

## 2022-09-17 ENCOUNTER — Ambulatory Visit (INDEPENDENT_AMBULATORY_CARE_PROVIDER_SITE_OTHER): Payer: PPO

## 2022-09-17 DIAGNOSIS — Z13820 Encounter for screening for osteoporosis: Secondary | ICD-10-CM | POA: Diagnosis not present

## 2022-09-17 DIAGNOSIS — M85851 Other specified disorders of bone density and structure, right thigh: Secondary | ICD-10-CM | POA: Diagnosis not present

## 2022-09-18 NOTE — Telephone Encounter (Signed)
I would be happy to sign plan of care, I just need something to sign.

## 2022-09-18 NOTE — Telephone Encounter (Signed)
Rep from [facility] called. Following up on signature for Point of Care.

## 2022-09-19 NOTE — Telephone Encounter (Signed)
That's right, I don't have any fax for him.

## 2022-11-11 DIAGNOSIS — M069 Rheumatoid arthritis, unspecified: Secondary | ICD-10-CM | POA: Diagnosis not present

## 2022-11-11 DIAGNOSIS — G894 Chronic pain syndrome: Secondary | ICD-10-CM | POA: Diagnosis not present

## 2022-11-13 ENCOUNTER — Other Ambulatory Visit: Payer: Self-pay | Admitting: Sports Medicine

## 2022-11-13 DIAGNOSIS — K219 Gastro-esophageal reflux disease without esophagitis: Secondary | ICD-10-CM

## 2023-01-06 DIAGNOSIS — M069 Rheumatoid arthritis, unspecified: Secondary | ICD-10-CM | POA: Diagnosis not present

## 2023-01-06 DIAGNOSIS — Z79899 Other long term (current) drug therapy: Secondary | ICD-10-CM | POA: Diagnosis not present

## 2023-01-06 DIAGNOSIS — G894 Chronic pain syndrome: Secondary | ICD-10-CM | POA: Diagnosis not present

## 2023-01-06 DIAGNOSIS — Z5181 Encounter for therapeutic drug level monitoring: Secondary | ICD-10-CM | POA: Diagnosis not present

## 2023-01-21 ENCOUNTER — Ambulatory Visit (INDEPENDENT_AMBULATORY_CARE_PROVIDER_SITE_OTHER): Payer: PPO | Admitting: Sports Medicine

## 2023-01-21 ENCOUNTER — Encounter: Payer: Self-pay | Admitting: Sports Medicine

## 2023-01-21 DIAGNOSIS — Z23 Encounter for immunization: Secondary | ICD-10-CM | POA: Diagnosis not present

## 2023-01-21 DIAGNOSIS — M06041 Rheumatoid arthritis without rheumatoid factor, right hand: Secondary | ICD-10-CM | POA: Diagnosis not present

## 2023-01-21 DIAGNOSIS — Z0000 Encounter for general adult medical examination without abnormal findings: Secondary | ICD-10-CM | POA: Diagnosis not present

## 2023-01-21 DIAGNOSIS — M06042 Rheumatoid arthritis without rheumatoid factor, left hand: Secondary | ICD-10-CM

## 2023-01-21 NOTE — Assessment & Plan Note (Signed)
Flu shot today, return in late June 2025 for repeat fasting annual physical.

## 2023-01-21 NOTE — Progress Notes (Signed)
    Procedures performed today:    None.  Independent interpretation of notes and tests performed by another provider:   None.  Brief History, Exam, Impression, and Recommendations:    Rheumatoid arthritis involving both hands with negative rheumatoid factor (HCC) Seronegative rheumatoid arthritis managed by rheumatology, he also has a pain clinic. I filled out some of his disability paperwork today, this will be scanned in, we do need to certify this annually.  Annual physical exam Flu shot today, return in late June 2025 for repeat fasting annual physical.  I spent 30 minutes of total time managing this patient today, this includes chart review, face to face, and non-face to face time.  ____________________________________________ Cody Kane. Benjamin Stain, M.D., ABFM., CAQSM., AME. Primary Care and Sports Medicine [facility]  Adjunct Professor of Family Medicine  [place] of [facility] of Medicine  Restaurant manager, fast food

## 2023-01-21 NOTE — Assessment & Plan Note (Signed)
Seronegative rheumatoid arthritis managed by rheumatology, he also has a pain clinic. I filled out some of his disability paperwork today, this will be scanned in, we do need to certify this annually.

## 2023-02-07 ENCOUNTER — Other Ambulatory Visit: Payer: Self-pay | Admitting: Sports Medicine

## 2023-02-07 DIAGNOSIS — K219 Gastro-esophageal reflux disease without esophagitis: Secondary | ICD-10-CM

## 2023-02-17 DIAGNOSIS — M069 Rheumatoid arthritis, unspecified: Secondary | ICD-10-CM | POA: Diagnosis not present

## 2023-02-17 DIAGNOSIS — Z5181 Encounter for therapeutic drug level monitoring: Secondary | ICD-10-CM | POA: Diagnosis not present

## 2023-02-17 DIAGNOSIS — Z79899 Other long term (current) drug therapy: Secondary | ICD-10-CM | POA: Diagnosis not present

## 2023-02-17 DIAGNOSIS — M47816 Spondylosis without myelopathy or radiculopathy, lumbar region: Secondary | ICD-10-CM | POA: Diagnosis not present

## 2023-02-26 DIAGNOSIS — R972 Elevated prostate specific antigen [PSA]: Secondary | ICD-10-CM | POA: Diagnosis not present

## 2023-03-09 DIAGNOSIS — M47816 Spondylosis without myelopathy or radiculopathy, lumbar region: Secondary | ICD-10-CM | POA: Diagnosis not present

## 2023-03-25 DIAGNOSIS — K635 Polyp of colon: Secondary | ICD-10-CM | POA: Diagnosis not present

## 2023-03-25 DIAGNOSIS — Z1211 Encounter for screening for malignant neoplasm of colon: Secondary | ICD-10-CM | POA: Diagnosis not present

## 2023-03-25 DIAGNOSIS — Z860101 Personal history of adenomatous and serrated colon polyps: Secondary | ICD-10-CM | POA: Diagnosis not present

## 2023-03-25 LAB — HM COLONOSCOPY

## 2023-04-13 ENCOUNTER — Ambulatory Visit: Payer: PPO

## 2023-04-13 ENCOUNTER — Encounter: Payer: Self-pay | Admitting: Sports Medicine

## 2023-04-13 ENCOUNTER — Ambulatory Visit (INDEPENDENT_AMBULATORY_CARE_PROVIDER_SITE_OTHER): Payer: PPO | Admitting: Sports Medicine

## 2023-04-13 VITALS — BP 124/78 | HR 79 | Ht 70.0 in | Wt 172.0 lb

## 2023-04-13 DIAGNOSIS — N410 Acute prostatitis: Secondary | ICD-10-CM

## 2023-04-13 DIAGNOSIS — R197 Diarrhea, unspecified: Secondary | ICD-10-CM | POA: Diagnosis not present

## 2023-04-13 DIAGNOSIS — R112 Nausea with vomiting, unspecified: Secondary | ICD-10-CM

## 2023-04-13 MED ORDER — ONDANSETRON 8 MG PO TBDP: 8.0000 mg | ORAL_TABLET | Freq: Three times a day (TID) | ORAL | 3 refills | Status: AC | PRN

## 2023-04-13 MED ORDER — PROCHLORPERAZINE MALEATE 10 MG PO TABS: 10.0000 mg | ORAL_TABLET | Freq: Four times a day (QID) | ORAL | 3 refills | Status: AC | PRN

## 2023-04-13 NOTE — Assessment & Plan Note (Addendum)
Pleasant 68 year old male, he has had several acute illnesses including a kidney infection, upper respiratory faction and more recently abdominal pain. The abdominal pain came with vomiting and diarrhea multiple times per day, no hematochezia, hematemesis or melena. It has resolved since 3 days ago. His urine is fairly dark. Still feels somewhat weak. His exam is normal with the exception of minimal tenderness right costovertebral angle. Differential is broad including viral gastroenteritis, prostatitis. We will get some labs, urinalysis, he will push the fluids, adding Compazine and Zofran. Abdominal x-ray. Return to see me in a week or 2 if not significantly better.  PSA is elevated, urinalysis does show leukocytes, I am concerned that we are dealing with a prostatitis, awaiting cultures but I am going to go ahead and start an antibiotic, ciprofloxacin.

## 2023-04-13 NOTE — Progress Notes (Addendum)
    Procedures performed today:    None.  Independent interpretation of notes and tests performed by another provider:   None.  Brief History, Exam, Impression, and Recommendations:    Acute prostatitis Pleasant 68 year old male, he has had several acute illnesses including a kidney infection, upper respiratory faction and more recently abdominal pain. The abdominal pain came with vomiting and diarrhea multiple times per day, no hematochezia, hematemesis or melena. It has resolved since 3 days ago. His urine is fairly dark. Still feels somewhat weak. His exam is normal with the exception of minimal tenderness right costovertebral angle. Differential is broad including viral gastroenteritis, prostatitis. We will get some labs, urinalysis, he will push the fluids, adding Compazine and Zofran. Abdominal x-ray. Return to see me in a week or 2 if not significantly better.  PSA is elevated, urinalysis does show leukocytes, I am concerned that we are dealing with a prostatitis, awaiting cultures but I am going to go ahead and start an antibiotic, ciprofloxacin.     ____________________________________________ Ihor Austin. Benjamin Stain, M.D., ABFM., CAQSM., AME. Primary Care and Sports Medicine [place] [facility]  Adjunct Professor of Family Medicine  [place] of [facility] of Medicine  Restaurant manager, fast food

## 2023-04-14 ENCOUNTER — Encounter: Payer: Self-pay | Admitting: Sports Medicine

## 2023-04-14 MED ORDER — CIPROFLOXACIN HCL 750 MG PO TABS: 750.0000 mg | ORAL_TABLET | Freq: Two times a day (BID) | ORAL | 0 refills | Status: AC

## 2023-04-14 NOTE — Addendum Note (Signed)
Addended by: Monica Becton on: 04/14/2023 11:19 AM   Modules accepted: Orders

## 2023-04-16 ENCOUNTER — Telehealth: Payer: Self-pay

## 2023-04-16 LAB — MICROSCOPIC EXAMINATION
Bacteria, UA: NONE SEEN
Casts: NONE SEEN /[LPF]
RBC, Urine: NONE SEEN /[HPF] (ref 0–2)

## 2023-04-16 LAB — COMPREHENSIVE METABOLIC PANEL WITH GFR
ALT: 27 IU/L (ref 0–44)
AST: 26 IU/L (ref 0–40)
Albumin: 4.2 g/dL (ref 3.9–4.9)
Bilirubin Total: 0.5 mg/dL (ref 0.0–1.2)
Creatinine, Ser: 0.92 mg/dL (ref 0.76–1.27)
Potassium: 4.3 mmol/L (ref 3.5–5.2)
Sodium: 137 mmol/L (ref 134–144)

## 2023-04-16 LAB — UA/M W/RFLX CULTURE, ROUTINE
Glucose, UA: NEGATIVE
Nitrite, UA: NEGATIVE
Protein,UA: NEGATIVE
RBC, UA: NEGATIVE
Specific Gravity, UA: >=1.03 — AB (ref 1.005–1.030)
Urobilinogen, Ur: 0.2 mg/dL (ref 0.2–1.0)
pH, UA: 5.5 (ref 5.0–7.5)

## 2023-04-16 LAB — CBC WITH DIFFERENTIAL/PLATELET
Basophils Absolute: 0.1 10*3/uL (ref 0.0–0.2)
Basos: 1 %
EOS (ABSOLUTE): 0.1 x10E3/uL (ref 0.0–0.4)
Eos: 1 %
Hematocrit: 43.7 % (ref 37.5–51.0)
Hemoglobin: 14.8 g/dL (ref 13.0–17.7)
Immature Grans (Abs): 0 x10E3/uL (ref 0.0–0.1)
Immature Granulocytes: 0 %
Lymphocytes Absolute: 2.1 10*3/uL (ref 0.7–3.1)
Lymphs: 31 %
MCH: 30.5 pg (ref 26.6–33.0)
MCHC: 33.9 g/dL (ref 31.5–35.7)
MCV: 90 fL (ref 79–97)
Monocytes Absolute: 0.7 10*3/uL (ref 0.1–0.9)
Monocytes: 10 %
Neutrophils Absolute: 3.8 x10E3/uL (ref 1.4–7.0)
Neutrophils: 57 %
Platelets: 212 10*3/uL (ref 150–450)
RBC: 4.85 x10E6/uL (ref 4.14–5.80)
RDW: 13.1 % (ref 11.6–15.4)
WBC: 6.6 10*3/uL (ref 3.4–10.8)

## 2023-04-16 LAB — URINE CULTURE, REFLEX: Organism ID, Bacteria: NO GROWTH

## 2023-04-16 LAB — COMPREHENSIVE METABOLIC PANEL
Alkaline Phosphatase: 70 [IU]/L (ref 44–121)
BUN/Creatinine Ratio: 23 (ref 10–24)
BUN: 21 mg/dL (ref 8–27)
CO2: 23 mmol/L (ref 20–29)
Calcium: 9.3 mg/dL (ref 8.6–10.2)
Chloride: 102 mmol/L (ref 96–106)
Globulin, Total: 2.8 g/dL (ref 1.5–4.5)
Glucose: 87 mg/dL (ref 70–99)
Total Protein: 7 g/dL (ref 6.0–8.5)
eGFR: 91 mL/min/{1.73_m2} (ref >59)

## 2023-04-16 LAB — PSA, TOTAL AND FREE
PSA, Free Pct: 15 %
PSA, Free: 1.2 ng/mL
Prostate Specific Ag, Serum: 8 ng/mL — ABNORMAL HIGH (ref 0.0–4.0)

## 2023-04-16 LAB — LIPASE: Lipase: 63 U/L (ref 13–78)

## 2023-04-16 NOTE — Telephone Encounter (Signed)
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 He states it is a strong medication. He wants to make sure he has to take the cipro even though he took the medication in November.

## 2023-04-16 NOTE — Telephone Encounter (Signed)
Patient advised.

## 2023-04-16 NOTE — Telephone Encounter (Signed)
Yes, take with food, fluoroquinolones have the best concentration in the urinary tissues and this is a new process.

## 2023-04-16 NOTE — Telephone Encounter (Signed)
Copied from CRM [id number]. Topic: Clinical - Lab/Test Results >> Apr 16, 2023  8:34 AM Nila Nephew wrote: Reason for CRM: Patient requesting a call back regarding results. Patient has questions regarding prescription Ciproflaxin.

## 2023-04-22 DIAGNOSIS — Z5181 Encounter for therapeutic drug level monitoring: Secondary | ICD-10-CM | POA: Diagnosis not present

## 2023-04-22 DIAGNOSIS — M069 Rheumatoid arthritis, unspecified: Secondary | ICD-10-CM | POA: Diagnosis not present

## 2023-04-22 DIAGNOSIS — G894 Chronic pain syndrome: Secondary | ICD-10-CM | POA: Diagnosis not present

## 2023-04-22 DIAGNOSIS — Z79899 Other long term (current) drug therapy: Secondary | ICD-10-CM | POA: Diagnosis not present

## 2023-05-15 ENCOUNTER — Other Ambulatory Visit: Payer: Self-pay | Admitting: Sports Medicine

## 2023-05-15 DIAGNOSIS — K219 Gastro-esophageal reflux disease without esophagitis: Secondary | ICD-10-CM

## 2023-05-19 DIAGNOSIS — Z79899 Other long term (current) drug therapy: Secondary | ICD-10-CM | POA: Diagnosis not present

## 2023-05-19 DIAGNOSIS — Z5181 Encounter for therapeutic drug level monitoring: Secondary | ICD-10-CM | POA: Diagnosis not present

## 2023-05-19 DIAGNOSIS — M069 Rheumatoid arthritis, unspecified: Secondary | ICD-10-CM | POA: Diagnosis not present

## 2023-07-09 DIAGNOSIS — N390 Urinary tract infection, site not specified: Secondary | ICD-10-CM | POA: Diagnosis not present

## 2023-07-15 DIAGNOSIS — M069 Rheumatoid arthritis, unspecified: Secondary | ICD-10-CM | POA: Diagnosis not present

## 2023-07-15 DIAGNOSIS — M47816 Spondylosis without myelopathy or radiculopathy, lumbar region: Secondary | ICD-10-CM | POA: Diagnosis not present

## 2023-07-15 DIAGNOSIS — G894 Chronic pain syndrome: Secondary | ICD-10-CM | POA: Diagnosis not present

## 2023-08-16 ENCOUNTER — Other Ambulatory Visit: Payer: Self-pay | Admitting: Sports Medicine

## 2023-08-16 DIAGNOSIS — K219 Gastro-esophageal reflux disease without esophagitis: Secondary | ICD-10-CM

## 2023-08-21 ENCOUNTER — Ambulatory Visit (INDEPENDENT_AMBULATORY_CARE_PROVIDER_SITE_OTHER): Payer: PPO | Admitting: Sports Medicine

## 2023-08-21 ENCOUNTER — Encounter: Payer: Self-pay | Admitting: Sports Medicine

## 2023-08-21 VITALS — BP 117/66 | HR 88 | Resp 20 | Ht 70.0 in | Wt 167.1 lb

## 2023-08-21 DIAGNOSIS — Z9079 Acquired absence of other genital organ(s): Secondary | ICD-10-CM

## 2023-08-21 DIAGNOSIS — E7800 Pure hypercholesterolemia, unspecified: Secondary | ICD-10-CM | POA: Diagnosis not present

## 2023-08-21 DIAGNOSIS — Z87891 Personal history of nicotine dependence: Secondary | ICD-10-CM | POA: Diagnosis not present

## 2023-08-21 DIAGNOSIS — Z0000 Encounter for general adult medical examination without abnormal findings: Secondary | ICD-10-CM | POA: Diagnosis not present

## 2023-08-21 DIAGNOSIS — Z23 Encounter for immunization: Secondary | ICD-10-CM

## 2023-08-21 NOTE — Assessment & Plan Note (Signed)
 Pleasant 68 year old male returns for his physical, he is due for Prevnar 20, given today. He is status post TURP. He is retired, happy, functional. Will check some routine labs, return to see me in a year.

## 2023-08-21 NOTE — Assessment & Plan Note (Signed)
 Currently half a pack a day smoker, precontemplative phase, discussed cessation strategies. Cody Kane is not ready yet.

## 2023-08-21 NOTE — Progress Notes (Signed)
 Subjective:    CC: Annual Physical Exam  HPI:  This patient is here for their annual physical  I reviewed the past medical history, family history, social history, surgical history, and allergies today and no changes were needed.  Please see the problem list section below in epic for further details.  Past Medical History: Past Medical History:  Diagnosis Date   Arthritis    BPH (benign prostatic hypertrophy)    Elevated PSA    GERD (gastroesophageal reflux disease)    Lower urinary tract symptoms (LUTS)    Mild obstructive sleep apnea    pt just had study done--  to start using cpap next week   Past Surgical History: Past Surgical History:  Procedure Laterality Date   COLONOSCOPY  2011   CYSTOSCOPY N/A 12/02/2013   Procedure: CYSTOSCOPY;  Surgeon: Christina Coyer, MD;  Location: [facility];  Service: Urology;  Laterality: N/A;   GREEN LIGHT LASER TURP (TRANSURETHRAL RESECTION OF PROSTATE N/A 01/10/2014   Procedure: GREEN LIGHT LASER TURP (TRANSURETHRAL RESECTION OF PROSTATE;  Surgeon: Christina Coyer, MD;  [facility];  Service: Urology;  Laterality: N/A;   PROSTATE BIOPSY N/A 12/02/2013   Procedure: BIOPSY TRANSRECTAL ULTRASONIC PROSTATE (TUBP);  Surgeon: Christina Coyer, MD;  Location: [facility];  Service: Urology;  Laterality: N/A;   Social History: Social History   Socioeconomic History   Marital status: Married    Spouse name: [name]   Number of children: 1   Years of education: [employer] education level: Some college, no degree  Occupational History    Comment: Administration    Occupation: Retired  Tobacco Use   Smoking status: Every Day    Current packs/day: 1.00    Average packs/day: 1 pack/day for 26.0 years (26.0 ttl pk-yrs)    Types: Cigarettes   Smokeless tobacco: Never  Vaping Use   Vaping status: Some Days  Substance and Sexual Activity   Alcohol use: Yes    Alcohol/week: 0.0  standard drinks of alcohol    Comment: occasional   Drug use: No   Sexual activity: Not on file  Other Topics Concern   Not on file  Social History Narrative   Lives with his spouse. Enjoys playing golf.   Social Drivers of Corporate investment banker Strain: Low Risk  (08/18/2022)   Overall Financial Resource Strain (CARDIA)    Difficulty of Paying Living Expenses: Not hard at all  Food Insecurity: Low Risk  (08/04/2023)   Received from [facility]   Hunger Vital Sign    Within the past 12 months, you worried that your food would run out before you got money to buy more: Never true    Within the past 12 months, the food you bought just didn't last and you didn't have money to get more. : Never true  Transportation Needs: No Transportation Needs (08/04/2023)   Received from [facility]    In the past 12 months, has lack of reliable transportation kept you from medical appointments, meetings, work or from getting things needed for daily living? : No  Physical Activity: Sufficiently Active (08/18/2022)   Exercise Vital Sign    Days of Exercise per Week: 5 days    Minutes of Exercise per Session: 150+ min  Stress: No Stress Concern Present (08/18/2022)   [employer] of Occupational Health - Occupational Stress Questionnaire    Feeling of Stress : Not at all  Social  Connections: Moderately Isolated (08/18/2022)   Social Connection and Isolation Panel    Frequency of Communication with Friends and Family: Once a week    Frequency of Social Gatherings with Friends and Family: More than three times a week    Attends Religious Services: Never    Database administrator or Organizations: No    Attends Banker Meetings: Never    Marital Status: Married   Family History: Family History  Problem Relation Age of Onset   Cancer Mother        colon   Cancer Father        lung   Allergies: Allergies  Allergen Reactions   Flomax  [Tamsulosin ]    Statins  Other (See Comments)    Myalgias and polyuria, with atorvastatin  and Crestor    Finasteride Rash   Medications: See med rec.  Review of Systems: No headache, visual changes, nausea, vomiting, diarrhea, constipation, dizziness, abdominal pain, skin rash, fevers, chills, night sweats, swollen lymph nodes, weight loss, chest pain, body aches, joint swelling, muscle aches, shortness of breath, mood changes, visual or auditory hallucinations.  Objective:    General: Well Developed, well nourished, and in no acute distress.  Neuro: Alert and oriented x3, extra-ocular muscles intact, sensation grossly intact. Cranial nerves II through XII are intact, motor, sensory, and coordinative functions are all intact. HEENT: Normocephalic, atraumatic, pupils equal round reactive to light, neck supple, no masses, no lymphadenopathy, thyroid  nonpalpable. Oropharynx, nasopharynx, external ear canals are unremarkable. Skin: Warm and dry, no rashes noted.  Cardiac: Regular rate and rhythm, no murmurs rubs or gallops.  Respiratory: Clear to auscultation bilaterally. Not using accessory muscles, speaking in full sentences.  Abdominal: Soft, nontender, nondistended, positive bowel sounds, no masses, no organomegaly.  Musculoskeletal: Shoulder, elbow, wrist, hip, knee, ankle stable, and with full range of motion.  Impression and Recommendations:    The patient was counselled, risk factors were discussed, anticipatory guidance given.  Annual physical exam Pleasant 68 year old male returns for his physical, he is due for Prevnar 20, given today. He is status post TURP. He is retired, happy, functional. Will check some routine labs, return to see me in a year.  History of smoking Currently half a pack a day smoker, precontemplative phase, discussed cessation strategies. [place] is not ready yet.   ____________________________________________ Joselyn Nicely. Sandy Crumb, M.D., ABFM., CAQSM., AME. Primary Care and  Sports Medicine [place] [facility]  Adjunct Professor of [facility] Medicine  [employer]  Restaurant manager, fast food

## 2023-08-25 ENCOUNTER — Encounter

## 2023-08-28 DIAGNOSIS — E7800 Pure hypercholesterolemia, unspecified: Secondary | ICD-10-CM | POA: Diagnosis not present

## 2023-08-29 LAB — COMPREHENSIVE METABOLIC PANEL WITH GFR
ALT: 10 IU/L (ref 0–44)
AST: 14 IU/L (ref 0–40)
Albumin: 3.9 g/dL (ref 3.9–4.9)
Alkaline Phosphatase: 80 IU/L (ref 44–121)
BUN/Creatinine Ratio: 18 (ref 10–24)
BUN: 18 mg/dL (ref 8–27)
Bilirubin Total: 0.3 mg/dL (ref 0.0–1.2)
CO2: 21 mmol/L (ref 20–29)
Calcium: 9.5 mg/dL (ref 8.6–10.2)
Chloride: 105 mmol/L (ref 96–106)
Creatinine, Ser: 0.99 mg/dL (ref 0.76–1.27)
Globulin, Total: 2.4 g/dL (ref 1.5–4.5)
Glucose: 87 mg/dL (ref 70–99)
Potassium: 4.9 mmol/L (ref 3.5–5.2)
Sodium: 142 mmol/L (ref 134–144)
Total Protein: 6.3 g/dL (ref 6.0–8.5)
eGFR: 83 mL/min/{1.73_m2} (ref >59)

## 2023-08-29 LAB — CBC
Hematocrit: 41.7 % (ref 37.5–51.0)
Hemoglobin: 13.7 g/dL (ref 13.0–17.7)
MCH: 30.6 pg (ref 26.6–33.0)
MCHC: 32.9 g/dL (ref 31.5–35.7)
MCV: 93 fL (ref 79–97)
Platelets: 212 10*3/uL (ref 150–450)
RBC: 4.47 x10E6/uL (ref 4.14–5.80)
RDW: 12.7 % (ref 11.6–15.4)
WBC: 8.5 10*3/uL (ref 3.4–10.8)

## 2023-08-29 LAB — HEMOGLOBIN A1C
Est. average glucose Bld gHb Est-mCnc: 108 mg/dL
Hgb A1c MFr Bld: 5.4 % (ref 4.8–5.6)

## 2023-08-29 LAB — LIPID PANEL
Chol/HDL Ratio: 5.6 ratio — ABNORMAL HIGH (ref 0.0–5.0)
Cholesterol, Total: 236 mg/dL — ABNORMAL HIGH (ref 100–199)
HDL: 42 mg/dL (ref >39)
LDL Chol Calc (NIH): 168 mg/dL — ABNORMAL HIGH (ref 0–99)
Triglycerides: 143 mg/dL (ref 0–149)
VLDL Cholesterol Cal: 26 mg/dL (ref 5–40)

## 2023-08-29 LAB — TSH: TSH: 1.04 u[IU]/mL (ref 0.450–4.500)

## 2023-08-30 ENCOUNTER — Ambulatory Visit: Payer: Self-pay | Admitting: Sports Medicine

## 2023-09-15 DIAGNOSIS — R399 Unspecified symptoms and signs involving the genitourinary system: Secondary | ICD-10-CM | POA: Diagnosis not present

## 2023-10-03 ENCOUNTER — Other Ambulatory Visit: Payer: Self-pay | Admitting: Sports Medicine

## 2023-10-03 DIAGNOSIS — G894 Chronic pain syndrome: Secondary | ICD-10-CM

## 2023-10-03 DIAGNOSIS — M06041 Rheumatoid arthritis without rheumatoid factor, right hand: Secondary | ICD-10-CM

## 2023-11-03 ENCOUNTER — Encounter: Payer: Self-pay | Admitting: Sports Medicine

## 2023-11-23 DIAGNOSIS — R972 Elevated prostate specific antigen [PSA]: Secondary | ICD-10-CM | POA: Diagnosis not present

## 2024-08-22 ENCOUNTER — Encounter: Admitting: Sports Medicine
